# Patient Record
Sex: Female | Born: 1935
Health system: Southern US, Community
[De-identification: ages and names within clinical notes are randomized; demographics above are authoritative.]

## PROBLEM LIST (undated history)

## (undated) DIAGNOSIS — M419 Scoliosis, unspecified: Secondary | ICD-10-CM

## (undated) DIAGNOSIS — M479 Spondylosis, unspecified: Secondary | ICD-10-CM

## (undated) DIAGNOSIS — I10 Essential (primary) hypertension: Secondary | ICD-10-CM

## (undated) DIAGNOSIS — E785 Hyperlipidemia, unspecified: Secondary | ICD-10-CM

## (undated) DIAGNOSIS — Z886 Allergy status to analgesic agent status: Secondary | ICD-10-CM

## (undated) DIAGNOSIS — M1990 Unspecified osteoarthritis, unspecified site: Secondary | ICD-10-CM

## (undated) DIAGNOSIS — E079 Disorder of thyroid, unspecified: Secondary | ICD-10-CM

## (undated) DIAGNOSIS — J45909 Unspecified asthma, uncomplicated: Secondary | ICD-10-CM

## (undated) DIAGNOSIS — K219 Gastro-esophageal reflux disease without esophagitis: Secondary | ICD-10-CM

## (undated) HISTORY — DX: Allergy status to analgesic agent status: Z88.6

## (undated) HISTORY — DX: Unspecified osteoarthritis, unspecified site: M19.90

## (undated) HISTORY — DX: Gastro-esophageal reflux disease without esophagitis: K21.9

## (undated) HISTORY — DX: Hyperlipidemia, unspecified: E78.5

## (undated) HISTORY — DX: Essential (primary) hypertension: I10

## (undated) HISTORY — DX: Spondylosis, unspecified: M47.9

## (undated) HISTORY — PX: ABDOMINAL HYSTERECTOMY: SHX81

## (undated) HISTORY — DX: Disorder of thyroid, unspecified: E07.9

## (undated) HISTORY — DX: Scoliosis, unspecified: M41.9

## (undated) HISTORY — DX: Unspecified asthma, uncomplicated: J45.909

---

## 2004-08-05 HISTORY — PX: COLONOSCOPY: SHX174

## 2007-08-06 HISTORY — PX: BREAST BIOPSY: SHX20

## 2011-04-01 ENCOUNTER — Ambulatory Visit: Payer: Self-pay | Admitting: Internal Medicine

## 2011-08-06 HISTORY — PX: BLEPHAROPLASTY: SUR158

## 2011-08-27 DIAGNOSIS — K219 Gastro-esophageal reflux disease without esophagitis: Secondary | ICD-10-CM | POA: Diagnosis not present

## 2011-08-27 DIAGNOSIS — I10 Essential (primary) hypertension: Secondary | ICD-10-CM | POA: Diagnosis not present

## 2011-08-27 DIAGNOSIS — Z79899 Other long term (current) drug therapy: Secondary | ICD-10-CM | POA: Diagnosis not present

## 2011-08-27 DIAGNOSIS — E039 Hypothyroidism, unspecified: Secondary | ICD-10-CM | POA: Diagnosis not present

## 2011-08-27 DIAGNOSIS — Z23 Encounter for immunization: Secondary | ICD-10-CM | POA: Diagnosis not present

## 2011-08-27 DIAGNOSIS — G459 Transient cerebral ischemic attack, unspecified: Secondary | ICD-10-CM | POA: Diagnosis not present

## 2011-08-27 DIAGNOSIS — Z0000 Encounter for general adult medical examination without abnormal findings: Secondary | ICD-10-CM | POA: Diagnosis not present

## 2011-09-13 DIAGNOSIS — M5137 Other intervertebral disc degeneration, lumbosacral region: Secondary | ICD-10-CM | POA: Diagnosis not present

## 2011-10-02 DIAGNOSIS — J209 Acute bronchitis, unspecified: Secondary | ICD-10-CM | POA: Diagnosis not present

## 2011-10-02 DIAGNOSIS — J069 Acute upper respiratory infection, unspecified: Secondary | ICD-10-CM | POA: Diagnosis not present

## 2011-10-15 DIAGNOSIS — Z0000 Encounter for general adult medical examination without abnormal findings: Secondary | ICD-10-CM | POA: Diagnosis not present

## 2011-10-15 DIAGNOSIS — G459 Transient cerebral ischemic attack, unspecified: Secondary | ICD-10-CM | POA: Diagnosis not present

## 2011-10-15 DIAGNOSIS — Z79899 Other long term (current) drug therapy: Secondary | ICD-10-CM | POA: Diagnosis not present

## 2011-10-15 DIAGNOSIS — K219 Gastro-esophageal reflux disease without esophagitis: Secondary | ICD-10-CM | POA: Diagnosis not present

## 2011-10-15 DIAGNOSIS — I10 Essential (primary) hypertension: Secondary | ICD-10-CM | POA: Diagnosis not present

## 2011-10-15 DIAGNOSIS — E039 Hypothyroidism, unspecified: Secondary | ICD-10-CM | POA: Diagnosis not present

## 2011-11-11 DIAGNOSIS — H40019 Open angle with borderline findings, low risk, unspecified eye: Secondary | ICD-10-CM | POA: Diagnosis not present

## 2011-11-27 DIAGNOSIS — R928 Other abnormal and inconclusive findings on diagnostic imaging of breast: Secondary | ICD-10-CM | POA: Diagnosis not present

## 2012-01-22 DIAGNOSIS — H02839 Dermatochalasis of unspecified eye, unspecified eyelid: Secondary | ICD-10-CM | POA: Diagnosis not present

## 2012-01-22 DIAGNOSIS — H02409 Unspecified ptosis of unspecified eyelid: Secondary | ICD-10-CM | POA: Diagnosis not present

## 2012-02-03 DIAGNOSIS — R3129 Other microscopic hematuria: Secondary | ICD-10-CM | POA: Diagnosis not present

## 2012-02-03 DIAGNOSIS — N390 Urinary tract infection, site not specified: Secondary | ICD-10-CM | POA: Diagnosis not present

## 2012-02-10 DIAGNOSIS — H02409 Unspecified ptosis of unspecified eyelid: Secondary | ICD-10-CM | POA: Diagnosis not present

## 2012-02-11 DIAGNOSIS — H5340 Unspecified visual field defects: Secondary | ICD-10-CM | POA: Diagnosis not present

## 2012-02-11 DIAGNOSIS — H02109 Unspecified ectropion of unspecified eye, unspecified eyelid: Secondary | ICD-10-CM | POA: Diagnosis not present

## 2012-02-11 DIAGNOSIS — H02839 Dermatochalasis of unspecified eye, unspecified eyelid: Secondary | ICD-10-CM | POA: Diagnosis not present

## 2012-02-11 DIAGNOSIS — H02409 Unspecified ptosis of unspecified eyelid: Secondary | ICD-10-CM | POA: Diagnosis not present

## 2012-02-17 DIAGNOSIS — J40 Bronchitis, not specified as acute or chronic: Secondary | ICD-10-CM | POA: Diagnosis not present

## 2012-02-17 DIAGNOSIS — IMO0002 Reserved for concepts with insufficient information to code with codable children: Secondary | ICD-10-CM | POA: Diagnosis not present

## 2012-02-17 DIAGNOSIS — M1710 Unilateral primary osteoarthritis, unspecified knee: Secondary | ICD-10-CM | POA: Diagnosis not present

## 2012-02-17 DIAGNOSIS — R319 Hematuria, unspecified: Secondary | ICD-10-CM | POA: Diagnosis not present

## 2012-02-19 DIAGNOSIS — H02839 Dermatochalasis of unspecified eye, unspecified eyelid: Secondary | ICD-10-CM | POA: Diagnosis not present

## 2012-03-13 DIAGNOSIS — R5381 Other malaise: Secondary | ICD-10-CM | POA: Diagnosis not present

## 2012-03-13 DIAGNOSIS — R040 Epistaxis: Secondary | ICD-10-CM | POA: Diagnosis not present

## 2012-03-13 DIAGNOSIS — J45901 Unspecified asthma with (acute) exacerbation: Secondary | ICD-10-CM | POA: Diagnosis not present

## 2012-03-13 DIAGNOSIS — J40 Bronchitis, not specified as acute or chronic: Secondary | ICD-10-CM | POA: Diagnosis not present

## 2012-03-13 DIAGNOSIS — R5383 Other fatigue: Secondary | ICD-10-CM | POA: Diagnosis not present

## 2012-03-25 DIAGNOSIS — H02839 Dermatochalasis of unspecified eye, unspecified eyelid: Secondary | ICD-10-CM | POA: Diagnosis not present

## 2012-03-30 DIAGNOSIS — J189 Pneumonia, unspecified organism: Secondary | ICD-10-CM | POA: Diagnosis not present

## 2012-03-30 DIAGNOSIS — J40 Bronchitis, not specified as acute or chronic: Secondary | ICD-10-CM | POA: Diagnosis not present

## 2012-03-30 DIAGNOSIS — R911 Solitary pulmonary nodule: Secondary | ICD-10-CM | POA: Diagnosis not present

## 2012-05-01 DIAGNOSIS — Z23 Encounter for immunization: Secondary | ICD-10-CM | POA: Diagnosis not present

## 2012-06-05 DIAGNOSIS — R221 Localized swelling, mass and lump, neck: Secondary | ICD-10-CM | POA: Diagnosis not present

## 2012-06-05 DIAGNOSIS — R220 Localized swelling, mass and lump, head: Secondary | ICD-10-CM | POA: Diagnosis not present

## 2012-06-05 DIAGNOSIS — J45909 Unspecified asthma, uncomplicated: Secondary | ICD-10-CM | POA: Diagnosis not present

## 2012-06-16 DIAGNOSIS — R040 Epistaxis: Secondary | ICD-10-CM | POA: Diagnosis not present

## 2012-06-16 DIAGNOSIS — J342 Deviated nasal septum: Secondary | ICD-10-CM | POA: Diagnosis not present

## 2012-06-29 DIAGNOSIS — R911 Solitary pulmonary nodule: Secondary | ICD-10-CM | POA: Diagnosis not present

## 2012-06-29 DIAGNOSIS — Z87891 Personal history of nicotine dependence: Secondary | ICD-10-CM | POA: Diagnosis not present

## 2012-08-31 DIAGNOSIS — D235 Other benign neoplasm of skin of trunk: Secondary | ICD-10-CM | POA: Diagnosis not present

## 2012-08-31 DIAGNOSIS — L821 Other seborrheic keratosis: Secondary | ICD-10-CM | POA: Diagnosis not present

## 2012-08-31 DIAGNOSIS — L819 Disorder of pigmentation, unspecified: Secondary | ICD-10-CM | POA: Diagnosis not present

## 2012-08-31 DIAGNOSIS — L570 Actinic keratosis: Secondary | ICD-10-CM | POA: Diagnosis not present

## 2012-08-31 DIAGNOSIS — D1801 Hemangioma of skin and subcutaneous tissue: Secondary | ICD-10-CM | POA: Diagnosis not present

## 2012-09-04 DIAGNOSIS — G459 Transient cerebral ischemic attack, unspecified: Secondary | ICD-10-CM | POA: Diagnosis not present

## 2012-09-04 DIAGNOSIS — I10 Essential (primary) hypertension: Secondary | ICD-10-CM | POA: Diagnosis not present

## 2012-09-04 DIAGNOSIS — Z0000 Encounter for general adult medical examination without abnormal findings: Secondary | ICD-10-CM | POA: Diagnosis not present

## 2012-09-04 DIAGNOSIS — E039 Hypothyroidism, unspecified: Secondary | ICD-10-CM | POA: Diagnosis not present

## 2012-09-11 DIAGNOSIS — Z0000 Encounter for general adult medical examination without abnormal findings: Secondary | ICD-10-CM | POA: Diagnosis not present

## 2012-09-29 DIAGNOSIS — R918 Other nonspecific abnormal finding of lung field: Secondary | ICD-10-CM | POA: Diagnosis not present

## 2012-09-29 DIAGNOSIS — K7689 Other specified diseases of liver: Secondary | ICD-10-CM | POA: Diagnosis not present

## 2012-11-30 DIAGNOSIS — Z1231 Encounter for screening mammogram for malignant neoplasm of breast: Secondary | ICD-10-CM | POA: Diagnosis not present

## 2012-11-30 DIAGNOSIS — N281 Cyst of kidney, acquired: Secondary | ICD-10-CM | POA: Diagnosis not present

## 2012-11-30 DIAGNOSIS — E279 Disorder of adrenal gland, unspecified: Secondary | ICD-10-CM | POA: Diagnosis not present

## 2012-12-25 DIAGNOSIS — E039 Hypothyroidism, unspecified: Secondary | ICD-10-CM | POA: Diagnosis not present

## 2013-03-19 DIAGNOSIS — J45909 Unspecified asthma, uncomplicated: Secondary | ICD-10-CM | POA: Diagnosis not present

## 2013-03-19 DIAGNOSIS — R911 Solitary pulmonary nodule: Secondary | ICD-10-CM | POA: Diagnosis not present

## 2013-04-02 DIAGNOSIS — R918 Other nonspecific abnormal finding of lung field: Secondary | ICD-10-CM | POA: Diagnosis not present

## 2013-04-02 DIAGNOSIS — K7689 Other specified diseases of liver: Secondary | ICD-10-CM | POA: Diagnosis not present

## 2013-04-27 DIAGNOSIS — Z23 Encounter for immunization: Secondary | ICD-10-CM | POA: Diagnosis not present

## 2013-05-10 DIAGNOSIS — H4011X0 Primary open-angle glaucoma, stage unspecified: Secondary | ICD-10-CM | POA: Diagnosis not present

## 2013-05-10 DIAGNOSIS — H409 Unspecified glaucoma: Secondary | ICD-10-CM | POA: Diagnosis not present

## 2013-05-25 DIAGNOSIS — J45901 Unspecified asthma with (acute) exacerbation: Secondary | ICD-10-CM | POA: Diagnosis not present

## 2013-06-07 DIAGNOSIS — B370 Candidal stomatitis: Secondary | ICD-10-CM | POA: Diagnosis not present

## 2013-08-05 HISTORY — PX: ESOPHAGOGASTRODUODENOSCOPY: SHX1529

## 2013-09-06 DIAGNOSIS — I10 Essential (primary) hypertension: Secondary | ICD-10-CM | POA: Diagnosis not present

## 2013-09-06 DIAGNOSIS — E785 Hyperlipidemia, unspecified: Secondary | ICD-10-CM | POA: Diagnosis not present

## 2013-09-06 DIAGNOSIS — K219 Gastro-esophageal reflux disease without esophagitis: Secondary | ICD-10-CM | POA: Diagnosis not present

## 2013-09-06 DIAGNOSIS — E039 Hypothyroidism, unspecified: Secondary | ICD-10-CM | POA: Diagnosis not present

## 2013-09-06 DIAGNOSIS — Z79899 Other long term (current) drug therapy: Secondary | ICD-10-CM | POA: Diagnosis not present

## 2013-09-06 DIAGNOSIS — Z0000 Encounter for general adult medical examination without abnormal findings: Secondary | ICD-10-CM | POA: Diagnosis not present

## 2013-10-08 DIAGNOSIS — L821 Other seborrheic keratosis: Secondary | ICD-10-CM | POA: Diagnosis not present

## 2013-10-08 DIAGNOSIS — Q828 Other specified congenital malformations of skin: Secondary | ICD-10-CM | POA: Diagnosis not present

## 2013-10-08 DIAGNOSIS — D1801 Hemangioma of skin and subcutaneous tissue: Secondary | ICD-10-CM | POA: Diagnosis not present

## 2013-10-08 DIAGNOSIS — D235 Other benign neoplasm of skin of trunk: Secondary | ICD-10-CM | POA: Diagnosis not present

## 2013-10-08 DIAGNOSIS — L570 Actinic keratosis: Secondary | ICD-10-CM | POA: Diagnosis not present

## 2013-10-26 DIAGNOSIS — K2940 Chronic atrophic gastritis without bleeding: Secondary | ICD-10-CM | POA: Diagnosis not present

## 2013-10-26 DIAGNOSIS — Z79899 Other long term (current) drug therapy: Secondary | ICD-10-CM | POA: Diagnosis not present

## 2013-10-26 DIAGNOSIS — K222 Esophageal obstruction: Secondary | ICD-10-CM | POA: Diagnosis not present

## 2013-10-26 DIAGNOSIS — R1310 Dysphagia, unspecified: Secondary | ICD-10-CM | POA: Insufficient documentation

## 2013-10-26 DIAGNOSIS — E039 Hypothyroidism, unspecified: Secondary | ICD-10-CM | POA: Diagnosis not present

## 2013-10-26 DIAGNOSIS — J45909 Unspecified asthma, uncomplicated: Secondary | ICD-10-CM | POA: Diagnosis not present

## 2013-10-26 DIAGNOSIS — I10 Essential (primary) hypertension: Secondary | ICD-10-CM | POA: Diagnosis not present

## 2013-10-26 DIAGNOSIS — D131 Benign neoplasm of stomach: Secondary | ICD-10-CM | POA: Diagnosis not present

## 2013-10-26 DIAGNOSIS — K2100 Gastro-esophageal reflux disease with esophagitis, without bleeding: Secondary | ICD-10-CM | POA: Diagnosis not present

## 2013-10-26 DIAGNOSIS — K319 Disease of stomach and duodenum, unspecified: Secondary | ICD-10-CM | POA: Diagnosis not present

## 2013-10-26 DIAGNOSIS — K2970 Gastritis, unspecified, without bleeding: Secondary | ICD-10-CM | POA: Diagnosis not present

## 2013-10-26 DIAGNOSIS — IMO0002 Reserved for concepts with insufficient information to code with codable children: Secondary | ICD-10-CM | POA: Diagnosis not present

## 2013-12-01 DIAGNOSIS — Z1231 Encounter for screening mammogram for malignant neoplasm of breast: Secondary | ICD-10-CM | POA: Diagnosis not present

## 2013-12-20 DIAGNOSIS — R1110 Vomiting, unspecified: Secondary | ICD-10-CM | POA: Diagnosis not present

## 2013-12-20 DIAGNOSIS — K219 Gastro-esophageal reflux disease without esophagitis: Secondary | ICD-10-CM | POA: Diagnosis not present

## 2013-12-20 DIAGNOSIS — K222 Esophageal obstruction: Secondary | ICD-10-CM | POA: Diagnosis not present

## 2014-03-08 DIAGNOSIS — E039 Hypothyroidism, unspecified: Secondary | ICD-10-CM | POA: Diagnosis not present

## 2014-03-08 DIAGNOSIS — Z79899 Other long term (current) drug therapy: Secondary | ICD-10-CM | POA: Diagnosis not present

## 2014-03-08 DIAGNOSIS — K319 Disease of stomach and duodenum, unspecified: Secondary | ICD-10-CM | POA: Diagnosis not present

## 2014-03-08 DIAGNOSIS — I10 Essential (primary) hypertension: Secondary | ICD-10-CM | POA: Diagnosis not present

## 2014-03-08 DIAGNOSIS — K222 Esophageal obstruction: Secondary | ICD-10-CM | POA: Diagnosis not present

## 2014-03-08 DIAGNOSIS — R1310 Dysphagia, unspecified: Secondary | ICD-10-CM | POA: Diagnosis not present

## 2014-03-08 DIAGNOSIS — Z7902 Long term (current) use of antithrombotics/antiplatelets: Secondary | ICD-10-CM | POA: Diagnosis not present

## 2014-05-09 DIAGNOSIS — R918 Other nonspecific abnormal finding of lung field: Secondary | ICD-10-CM | POA: Diagnosis not present

## 2014-05-09 DIAGNOSIS — Z23 Encounter for immunization: Secondary | ICD-10-CM | POA: Diagnosis not present

## 2014-05-09 DIAGNOSIS — J4530 Mild persistent asthma, uncomplicated: Secondary | ICD-10-CM | POA: Diagnosis not present

## 2014-05-18 DIAGNOSIS — J398 Other specified diseases of upper respiratory tract: Secondary | ICD-10-CM | POA: Diagnosis not present

## 2014-05-18 DIAGNOSIS — R918 Other nonspecific abnormal finding of lung field: Secondary | ICD-10-CM | POA: Diagnosis not present

## 2014-05-18 DIAGNOSIS — K449 Diaphragmatic hernia without obstruction or gangrene: Secondary | ICD-10-CM | POA: Diagnosis not present

## 2014-06-15 DIAGNOSIS — K0381 Cracked tooth: Secondary | ICD-10-CM | POA: Diagnosis not present

## 2014-07-19 DIAGNOSIS — R1110 Vomiting, unspecified: Secondary | ICD-10-CM | POA: Diagnosis not present

## 2014-07-19 DIAGNOSIS — R109 Unspecified abdominal pain: Secondary | ICD-10-CM | POA: Diagnosis not present

## 2014-07-19 DIAGNOSIS — K222 Esophageal obstruction: Secondary | ICD-10-CM | POA: Diagnosis not present

## 2014-07-19 DIAGNOSIS — K219 Gastro-esophageal reflux disease without esophagitis: Secondary | ICD-10-CM | POA: Diagnosis not present

## 2014-08-08 DIAGNOSIS — H4011X2 Primary open-angle glaucoma, moderate stage: Secondary | ICD-10-CM | POA: Diagnosis not present

## 2014-09-13 DIAGNOSIS — N281 Cyst of kidney, acquired: Secondary | ICD-10-CM | POA: Diagnosis not present

## 2014-09-13 DIAGNOSIS — Z720 Tobacco use: Secondary | ICD-10-CM | POA: Diagnosis not present

## 2014-09-13 DIAGNOSIS — M4692 Unspecified inflammatory spondylopathy, cervical region: Secondary | ICD-10-CM | POA: Diagnosis not present

## 2014-09-13 DIAGNOSIS — I700 Atherosclerosis of aorta: Secondary | ICD-10-CM | POA: Diagnosis not present

## 2014-09-13 DIAGNOSIS — Z23 Encounter for immunization: Secondary | ICD-10-CM | POA: Diagnosis not present

## 2014-09-13 DIAGNOSIS — E039 Hypothyroidism, unspecified: Secondary | ICD-10-CM | POA: Diagnosis not present

## 2014-09-13 DIAGNOSIS — E782 Mixed hyperlipidemia: Secondary | ICD-10-CM | POA: Diagnosis not present

## 2014-09-13 DIAGNOSIS — G459 Transient cerebral ischemic attack, unspecified: Secondary | ICD-10-CM | POA: Diagnosis not present

## 2014-09-13 DIAGNOSIS — J4520 Mild intermittent asthma, uncomplicated: Secondary | ICD-10-CM | POA: Diagnosis not present

## 2014-09-13 DIAGNOSIS — K210 Gastro-esophageal reflux disease with esophagitis: Secondary | ICD-10-CM | POA: Diagnosis not present

## 2014-09-13 DIAGNOSIS — I10 Essential (primary) hypertension: Secondary | ICD-10-CM | POA: Diagnosis not present

## 2014-09-13 DIAGNOSIS — Z0000 Encounter for general adult medical examination without abnormal findings: Secondary | ICD-10-CM | POA: Diagnosis not present

## 2014-09-13 LAB — BASIC METABOLIC PANEL
BUN: 19 mg/dL (ref 4–21)
Creatinine: 1 mg/dL (ref 0.5–1.1)
Glucose: 95 mg/dL

## 2014-09-13 LAB — CBC AND DIFFERENTIAL
HEMOGLOBIN: 13.5 g/dL (ref 12.0–16.0)
WBC: 6.6 10^3/mL

## 2014-09-13 LAB — TSH: TSH: 0.5 u[IU]/mL (ref 0.41–5.90)

## 2014-09-13 LAB — LIPID PANEL
Cholesterol: 237 mg/dL — AB (ref 0–200)
HDL: 74 mg/dL — AB (ref 35–70)
LDL Cholesterol: 140 mg/dL
Triglycerides: 113 mg/dL (ref 40–160)

## 2014-12-05 DIAGNOSIS — Z1231 Encounter for screening mammogram for malignant neoplasm of breast: Secondary | ICD-10-CM | POA: Diagnosis not present

## 2014-12-14 DIAGNOSIS — N63 Unspecified lump in breast: Secondary | ICD-10-CM | POA: Diagnosis not present

## 2015-01-18 ENCOUNTER — Encounter: Payer: Self-pay | Admitting: Internal Medicine

## 2015-01-18 ENCOUNTER — Other Ambulatory Visit: Payer: Self-pay | Admitting: Internal Medicine

## 2015-01-18 DIAGNOSIS — R918 Other nonspecific abnormal finding of lung field: Secondary | ICD-10-CM | POA: Insufficient documentation

## 2015-01-18 DIAGNOSIS — E785 Hyperlipidemia, unspecified: Secondary | ICD-10-CM | POA: Insufficient documentation

## 2015-01-18 DIAGNOSIS — L309 Dermatitis, unspecified: Secondary | ICD-10-CM | POA: Insufficient documentation

## 2015-01-18 DIAGNOSIS — G43009 Migraine without aura, not intractable, without status migrainosus: Secondary | ICD-10-CM | POA: Insufficient documentation

## 2015-01-18 DIAGNOSIS — G939 Disorder of brain, unspecified: Secondary | ICD-10-CM | POA: Insufficient documentation

## 2015-01-18 DIAGNOSIS — E039 Hypothyroidism, unspecified: Secondary | ICD-10-CM | POA: Insufficient documentation

## 2015-01-18 DIAGNOSIS — F17201 Nicotine dependence, unspecified, in remission: Secondary | ICD-10-CM | POA: Insufficient documentation

## 2015-01-18 DIAGNOSIS — J4520 Mild intermittent asthma, uncomplicated: Secondary | ICD-10-CM | POA: Insufficient documentation

## 2015-01-18 DIAGNOSIS — I700 Atherosclerosis of aorta: Secondary | ICD-10-CM | POA: Insufficient documentation

## 2015-01-18 DIAGNOSIS — K2100 Gastro-esophageal reflux disease with esophagitis, without bleeding: Secondary | ICD-10-CM | POA: Insufficient documentation

## 2015-01-18 DIAGNOSIS — I6782 Cerebral ischemia: Secondary | ICD-10-CM | POA: Insufficient documentation

## 2015-01-18 DIAGNOSIS — Z860101 Personal history of adenomatous and serrated colon polyps: Secondary | ICD-10-CM | POA: Insufficient documentation

## 2015-01-18 DIAGNOSIS — L570 Actinic keratosis: Secondary | ICD-10-CM | POA: Diagnosis not present

## 2015-01-18 DIAGNOSIS — N281 Cyst of kidney, acquired: Secondary | ICD-10-CM | POA: Insufficient documentation

## 2015-01-18 DIAGNOSIS — I10 Essential (primary) hypertension: Secondary | ICD-10-CM | POA: Insufficient documentation

## 2015-01-18 DIAGNOSIS — L821 Other seborrheic keratosis: Secondary | ICD-10-CM | POA: Diagnosis not present

## 2015-01-18 DIAGNOSIS — M47812 Spondylosis without myelopathy or radiculopathy, cervical region: Secondary | ICD-10-CM | POA: Insufficient documentation

## 2015-01-18 DIAGNOSIS — Z86010 Personal history of colonic polyps: Secondary | ICD-10-CM | POA: Insufficient documentation

## 2015-01-18 DIAGNOSIS — M1990 Unspecified osteoarthritis, unspecified site: Secondary | ICD-10-CM | POA: Insufficient documentation

## 2015-01-20 ENCOUNTER — Ambulatory Visit: Payer: Self-pay | Admitting: Internal Medicine

## 2015-01-24 DIAGNOSIS — M5030 Other cervical disc degeneration, unspecified cervical region: Secondary | ICD-10-CM | POA: Diagnosis not present

## 2015-01-24 DIAGNOSIS — G5602 Carpal tunnel syndrome, left upper limb: Secondary | ICD-10-CM | POA: Diagnosis not present

## 2015-02-07 DIAGNOSIS — M5030 Other cervical disc degeneration, unspecified cervical region: Secondary | ICD-10-CM | POA: Diagnosis not present

## 2015-02-07 DIAGNOSIS — G5602 Carpal tunnel syndrome, left upper limb: Secondary | ICD-10-CM | POA: Diagnosis not present

## 2015-02-16 DIAGNOSIS — G5602 Carpal tunnel syndrome, left upper limb: Secondary | ICD-10-CM | POA: Diagnosis not present

## 2015-02-16 DIAGNOSIS — M5030 Other cervical disc degeneration, unspecified cervical region: Secondary | ICD-10-CM | POA: Diagnosis not present

## 2015-02-23 DIAGNOSIS — G5602 Carpal tunnel syndrome, left upper limb: Secondary | ICD-10-CM | POA: Diagnosis not present

## 2015-02-28 ENCOUNTER — Other Ambulatory Visit: Payer: Self-pay | Admitting: Internal Medicine

## 2015-02-28 DIAGNOSIS — K0263 Dental caries on smooth surface penetrating into pulp: Secondary | ICD-10-CM | POA: Diagnosis not present

## 2015-05-10 DIAGNOSIS — R918 Other nonspecific abnormal finding of lung field: Secondary | ICD-10-CM | POA: Diagnosis not present

## 2015-05-29 DIAGNOSIS — Z23 Encounter for immunization: Secondary | ICD-10-CM | POA: Diagnosis not present

## 2015-06-13 DIAGNOSIS — R911 Solitary pulmonary nodule: Secondary | ICD-10-CM | POA: Diagnosis not present

## 2015-06-20 DIAGNOSIS — R921 Mammographic calcification found on diagnostic imaging of breast: Secondary | ICD-10-CM | POA: Diagnosis not present

## 2015-06-20 DIAGNOSIS — N63 Unspecified lump in breast: Secondary | ICD-10-CM | POA: Diagnosis not present

## 2015-06-20 DIAGNOSIS — N6001 Solitary cyst of right breast: Secondary | ICD-10-CM | POA: Diagnosis not present

## 2015-06-30 DIAGNOSIS — J4521 Mild intermittent asthma with (acute) exacerbation: Secondary | ICD-10-CM | POA: Diagnosis not present

## 2015-06-30 DIAGNOSIS — J189 Pneumonia, unspecified organism: Secondary | ICD-10-CM | POA: Diagnosis not present

## 2015-06-30 DIAGNOSIS — J069 Acute upper respiratory infection, unspecified: Secondary | ICD-10-CM | POA: Diagnosis not present

## 2015-07-12 DIAGNOSIS — J42 Unspecified chronic bronchitis: Secondary | ICD-10-CM | POA: Diagnosis not present

## 2015-07-13 DIAGNOSIS — J189 Pneumonia, unspecified organism: Secondary | ICD-10-CM | POA: Diagnosis not present

## 2015-07-13 DIAGNOSIS — J42 Unspecified chronic bronchitis: Secondary | ICD-10-CM | POA: Diagnosis not present

## 2015-07-13 DIAGNOSIS — R911 Solitary pulmonary nodule: Secondary | ICD-10-CM | POA: Diagnosis not present

## 2015-08-12 ENCOUNTER — Other Ambulatory Visit: Payer: Self-pay | Admitting: Internal Medicine

## 2015-08-16 NOTE — Telephone Encounter (Signed)
pts coming in on 09/12/15 for a f/u and 11/20/15 for her cpe

## 2015-09-09 ENCOUNTER — Other Ambulatory Visit: Payer: Self-pay | Admitting: Internal Medicine

## 2015-09-12 ENCOUNTER — Encounter: Payer: Self-pay | Admitting: Internal Medicine

## 2015-09-12 ENCOUNTER — Ambulatory Visit (INDEPENDENT_AMBULATORY_CARE_PROVIDER_SITE_OTHER): Payer: Medicare Other | Admitting: Internal Medicine

## 2015-09-12 VITALS — BP 146/82 | HR 83 | Temp 98.1°F | Ht 63.0 in | Wt 162.4 lb

## 2015-09-12 DIAGNOSIS — E034 Atrophy of thyroid (acquired): Secondary | ICD-10-CM

## 2015-09-12 DIAGNOSIS — I10 Essential (primary) hypertension: Secondary | ICD-10-CM

## 2015-09-12 DIAGNOSIS — J4520 Mild intermittent asthma, uncomplicated: Secondary | ICD-10-CM | POA: Diagnosis not present

## 2015-09-12 DIAGNOSIS — E785 Hyperlipidemia, unspecified: Secondary | ICD-10-CM

## 2015-09-12 DIAGNOSIS — R6889 Other general symptoms and signs: Secondary | ICD-10-CM | POA: Diagnosis not present

## 2015-09-12 DIAGNOSIS — E038 Other specified hypothyroidism: Secondary | ICD-10-CM | POA: Diagnosis not present

## 2015-09-12 DIAGNOSIS — G43009 Migraine without aura, not intractable, without status migrainosus: Secondary | ICD-10-CM

## 2015-09-12 LAB — POCT INFLUENZA A/B
INFLUENZA A, POC: NEGATIVE
INFLUENZA B, POC: POSITIVE — AB

## 2015-09-12 MED ORDER — LEVOTHYROXINE SODIUM 125 MCG PO TABS: 125.0000 ug | ORAL_TABLET | Freq: Every day | ORAL | Status: DC

## 2015-09-12 MED ORDER — DIAZEPAM 10 MG PO TABS: ORAL_TABLET | ORAL | Status: DC

## 2015-09-12 MED ORDER — CLOPIDOGREL BISULFATE 75 MG PO TABS: 75.0000 mg | ORAL_TABLET | Freq: Every day | ORAL | Status: DC

## 2015-09-12 MED ORDER — ALBUTEROL SULFATE HFA 108 (90 BASE) MCG/ACT IN AERS: 2.0000 | INHALATION_SPRAY | Freq: Four times a day (QID) | RESPIRATORY_TRACT | Status: DC | PRN

## 2015-09-12 MED ORDER — FLUTICASONE-SALMETEROL 250-50 MCG/DOSE IN AEPB: 1.0000 | INHALATION_SPRAY | Freq: Two times a day (BID) | RESPIRATORY_TRACT | Status: DC

## 2015-09-12 MED ORDER — MELOXICAM 15 MG PO TABS: 15.0000 mg | ORAL_TABLET | Freq: Every day | ORAL | Status: DC

## 2015-09-12 MED ORDER — OSELTAMIVIR PHOSPHATE 75 MG PO CAPS: 75.0000 mg | ORAL_CAPSULE | Freq: Two times a day (BID) | ORAL | Status: DC

## 2015-09-12 MED ORDER — IRBESARTAN 300 MG PO TABS: 300.0000 mg | ORAL_TABLET | Freq: Every day | ORAL | Status: DC

## 2015-09-12 MED ORDER — RANITIDINE HCL 150 MG PO TABS: 300.0000 mg | ORAL_TABLET | Freq: Two times a day (BID) | ORAL | Status: DC

## 2015-09-12 MED ORDER — HYDROCODONE-HOMATROPINE 5-1.5 MG/5ML PO SYRP: 5.0000 mL | ORAL_SOLUTION | Freq: Four times a day (QID) | ORAL | Status: DC | PRN

## 2015-09-12 NOTE — Progress Notes (Signed)
Date:  09/12/2015   Name:  Crystal Zavala   DOB:  12/10/1931   MRN:  [id number]   Chief Complaint: Follow-up; Hypothyroidism; Hypertension; and Cough Hypertension This is a chronic problem. The current episode started more than 1 year ago. The problem is unchanged. The problem is controlled. Associated symptoms include chest pain, headaches and shortness of breath. Pertinent negatives include no palpitations. Hypertensive end-organ damage includes a thyroid problem.  Cough This is a new problem. The current episode started in the past 7 days. The problem has been unchanged. The problem occurs every few minutes. The cough is non-productive. Associated symptoms include chest pain, chills, a fever, headaches, postnasal drip, shortness of breath and wheezing. Pertinent negatives include no eye redness or rash. She has tried OTC cough suppressant for the symptoms. The treatment provided mild relief.  Thyroid Problem Patient reports no cold intolerance, constipation, depressed mood, diarrhea or palpitations. The symptoms have been stable. Past treatments include levothyroxine. The treatment provided significant relief.  Gastroesophageal Reflux She complains of chest pain, coughing and wheezing. She reports no abdominal pain, no choking or no nausea. The problem occurs rarely. The problem has been waxing and waning. She has tried a histamine-2 antagonist for the symptoms. The treatment provided significant relief.    Review of Systems  Constitutional: Positive for fever and chills.  HENT: Positive for postnasal drip, sinus pressure and sneezing. Negative for tinnitus and trouble swallowing.   Eyes: Negative for redness and visual disturbance.  Respiratory: Positive for cough, shortness of breath and wheezing. Negative for choking and stridor.   Cardiovascular: Positive for chest pain. Negative for palpitations and leg swelling.  Gastrointestinal: Negative for nausea, abdominal pain, diarrhea and  constipation.  Endocrine: Negative for cold intolerance.  Genitourinary: Negative for dysuria and hematuria.  Musculoskeletal: Negative for arthralgias and gait problem.  Skin: Negative for rash.  Neurological: Positive for headaches.  Psychiatric/Behavioral: Negative for sleep disturbance and dysphoric mood.    Patient Active Problem List   Diagnosis Date Noted  . Aortic atherosclerosis ([place]) 01/18/2015  . Arthritis of neck ([place]) 01/18/2015  . Dermatitis, eczematoid 01/18/2015  . Essential (primary) hypertension 01/18/2015  . Esophagitis, reflux 01/18/2015  . H/O adenomatous polyp of colon 01/18/2015  . Adult hypothyroidism 01/18/2015  . Migraine without aura and responsive to treatment 01/18/2015  . Asthma, mild intermittent 01/18/2015  . Hyperlipidemia, mild 01/18/2015  . Arthritis of knee, degenerative 01/18/2015  . Lung nodule, multiple 01/18/2015  . Kidney cysts 01/18/2015  . Current tobacco use 01/18/2015  . Temporary cerebral vascular dysfunction 01/18/2015    Prior to Admission medications   Medication Sig Start Date End Date Taking? Authorizing Provider  albuterol (PROAIR HFA) 108 (90 BASE) MCG/ACT inhaler Inhale 2 puffs into the lungs 4 (four) times daily as needed.   Yes Historical Provider, MD  clopidogrel (PLAVIX) 75 MG tablet Take 1 tablet by mouth daily. 09/13/14  Yes Historical Provider, MD  diazepam (VALIUM) 10 MG tablet TAKE 1 TABLET BY MOUTH NIGHTLY AS NEEDED 02/28/15  Yes Glean Hess, MD  Fluticasone-Salmeterol (ADVAIR DISKUS) 250-50 MCG/DOSE AEPB Inhale 1 puff into the lungs 2 (two) times daily.   Yes Historical Provider, MD  irbesartan (AVAPRO) 300 MG tablet Take 1 tablet by mouth daily. 09/13/14  Yes Historical Provider, MD  levothyroxine (SYNTHROID, LEVOTHROID) 125 MCG tablet TAKE 1 TABLET BY MOUTH DAILY 09/09/15  Yes Glean Hess, MD  meloxicam (MOBIC) 15 MG tablet TAKE 1 TABLET BY MOUTH EVERY DAY  08/12/15  Yes Glean Hess, MD  ranitidine (ZANTAC)  150 MG tablet Take 2 tablets by mouth 2 (two) times daily.   Yes Historical Provider, MD    Allergies  Allergen Reactions  . Ace Inhibitors Cough    Past Surgical History  Procedure Laterality Date  . Abdominal hysterectomy    . Blepharoplasty Bilateral 2013  . Breast biopsy Left 2009    benign  . Esophagogastroduodenoscopy  2015    Dilation of esophageal stricture  . Colonoscopy  2006    Normal    Social History  Substance Use Topics  . Smoking status: Former Research scientist (life sciences)  . Smokeless tobacco: None  . Alcohol Use: No     Medication list has been reviewed and updated.   Physical Exam  Constitutional: She is oriented to person, place, and time. She appears well-developed and well-nourished. She has a sickly appearance.  HENT:  Right Ear: External ear and ear canal normal. Tympanic membrane is retracted. Tympanic membrane is not erythematous.  Left Ear: External ear and ear canal normal. Tympanic membrane is retracted. Tympanic membrane is not erythematous.  Nose: Right sinus exhibits maxillary sinus tenderness and frontal sinus tenderness. Left sinus exhibits maxillary sinus tenderness and frontal sinus tenderness.  Mouth/Throat: Uvula is midline and mucous membranes are normal. No oral lesions. No oropharyngeal exudate or posterior oropharyngeal erythema.  Neck: Normal range of motion. Neck supple. No thyromegaly present.  Cardiovascular: Normal rate, regular rhythm and normal heart sounds.   Pulmonary/Chest: Effort normal and breath sounds normal. No accessory muscle usage. No respiratory distress. She has no wheezes. She has no rales.  Lymphadenopathy:    She has no cervical adenopathy.  Neurological: She is alert and oriented to person, place, and time.  Psychiatric: She has a normal mood and affect.    BP 146/82 mmHg  Pulse 83  Temp(Src) 98.1 F (36.7 C) (Oral)  Ht 5\' 3"  (1.6 m)  Wt 162 lb 6.4 oz (73.664 kg)  BMI 28.78 kg/m2  SpO2 97%  Assessment and Plan: 1.  Flu-like symptoms Supportive care for influenza with rest and fluids - POCT Influenza A/B (positive for B) - HYDROcodone-homatropine (HYCODAN) 5-1.5 MG/5ML syrup; Take 5 mLs by mouth every 6 (six) hours as needed for cough.  Dispense: 120 mL; Refill: 0 - oseltamivir (TAMIFLU) 75 MG capsule; Take 1 capsule (75 mg total) by mouth 2 (two) times daily.  Dispense: 10 capsule; Refill: 0  2. Migraine without aura and responsive to treatment Minimal symptoms  3. Essential (primary) hypertension controlled - irbesartan (AVAPRO) 300 MG tablet; Take 1 tablet (300 mg total) by mouth daily.  Dispense: 90 tablet; Refill: 3 - CBC with Differential/Platelet - Comprehensive metabolic panel  4. Asthma, mild intermittent, uncomplicated Doing well on maintenance therapy - albuterol (PROAIR HFA) 108 (90 Base) MCG/ACT inhaler; Inhale 2 puffs into the lungs 4 (four) times daily as needed.  Dispense: 54 g; Refill: 3 - Fluticasone-Salmeterol (ADVAIR DISKUS) 250-50 MCG/DOSE AEPB; Inhale 1 puff into the lungs 2 (two) times daily.  Dispense: 180 each; Refill: 3  5. Hypothyroidism due to acquired atrophy of thyroid supplemented - levothyroxine (SYNTHROID, LEVOTHROID) 125 MCG tablet; Take 1 tablet (125 mcg total) by mouth daily.  Dispense: 90 tablet; Refill: 3 - TSH  6. Hyperlipidemia, mild Continue low fat diet - Lipid panel   Halina Maidens, MD [facility]  09/12/2015

## 2015-09-12 NOTE — Patient Instructions (Signed)

## 2015-09-13 ENCOUNTER — Telehealth: Payer: Self-pay

## 2015-09-13 LAB — CBC WITH DIFFERENTIAL/PLATELET
BASOS: 0 %
Basophils Absolute: 0 10*3/uL (ref 0.0–0.2)
EOS (ABSOLUTE): 0.2 10*3/uL (ref 0.0–0.4)
EOS: 4 %
HEMATOCRIT: 38.8 % (ref 34.0–46.6)
HEMOGLOBIN: 13 g/dL (ref 11.1–15.9)
Immature Grans (Abs): 0 10*3/uL (ref 0.0–0.1)
Immature Granulocytes: 0 %
LYMPHS ABS: 1 10*3/uL (ref 0.7–3.1)
Lymphs: 20 %
MCH: 30.5 pg (ref 26.6–33.0)
MCHC: 33.5 g/dL (ref 31.5–35.7)
MCV: 91 fL (ref 79–97)
MONOCYTES: 17 %
MONOS ABS: 0.8 10*3/uL (ref 0.1–0.9)
NEUTROS ABS: 2.8 10*3/uL (ref 1.4–7.0)
Neutrophils: 59 %
Platelets: 230 10*3/uL (ref 150–379)
RBC: 4.26 x10E6/uL (ref 3.77–5.28)
RDW: 14 % (ref 12.3–15.4)
WBC: 4.9 10*3/uL (ref 3.4–10.8)

## 2015-09-13 LAB — COMPREHENSIVE METABOLIC PANEL
A/G RATIO: 1.7 (ref 1.1–2.5)
ALBUMIN: 4 g/dL (ref 3.5–4.7)
ALK PHOS: 116 IU/L (ref 39–117)
ALT: 15 IU/L (ref 0–32)
AST: 20 IU/L (ref 0–40)
BILIRUBIN TOTAL: 0.5 mg/dL (ref 0.0–1.2)
BUN / CREAT RATIO: 15 (ref 11–26)
BUN: 13 mg/dL (ref 8–27)
CO2: 24 mmol/L (ref 18–29)
CREATININE: 0.87 mg/dL (ref 0.57–1.00)
Calcium: 9.6 mg/dL (ref 8.7–10.3)
Chloride: 101 mmol/L (ref 96–106)
GFR calc Af Amer: 72 mL/min/{1.73_m2} (ref >59)
GFR calc non Af Amer: 62 mL/min/{1.73_m2} (ref >59)
GLOBULIN, TOTAL: 2.4 g/dL (ref 1.5–4.5)
Glucose: 80 mg/dL (ref 65–99)
POTASSIUM: 4.8 mmol/L (ref 3.5–5.2)
SODIUM: 139 mmol/L (ref 134–144)
Total Protein: 6.4 g/dL (ref 6.0–8.5)

## 2015-09-13 LAB — LIPID PANEL
CHOL/HDL RATIO: 3.1 ratio (ref 0.0–4.4)
CHOLESTEROL TOTAL: 196 mg/dL (ref 100–199)
HDL: 64 mg/dL (ref >39)
LDL CALC: 114 mg/dL — AB (ref 0–99)
TRIGLYCERIDES: 90 mg/dL (ref 0–149)
VLDL Cholesterol Cal: 18 mg/dL (ref 5–40)

## 2015-09-13 LAB — TSH: TSH: 0.044 u[IU]/mL — ABNORMAL LOW (ref 0.450–4.500)

## 2015-09-13 NOTE — Telephone Encounter (Signed)
-----   Message from Glean Hess, MD sent at 09/13/2015  8:11 AM EST ----- Labs are normal.  Thyroid dose may be slightly high but will not change it at this time.

## 2015-09-13 NOTE — Telephone Encounter (Signed)
Spoke with patient. Patient advised of all results and verbalized understanding. Will call back with any future questions or concerns. MAH  

## 2015-10-24 ENCOUNTER — Other Ambulatory Visit: Payer: Self-pay | Admitting: Internal Medicine

## 2015-11-20 ENCOUNTER — Encounter: Payer: Self-pay | Admitting: Internal Medicine

## 2016-01-05 DIAGNOSIS — N63 Unspecified lump in breast: Secondary | ICD-10-CM | POA: Diagnosis not present

## 2016-01-05 DIAGNOSIS — R921 Mammographic calcification found on diagnostic imaging of breast: Secondary | ICD-10-CM | POA: Diagnosis not present

## 2016-01-05 DIAGNOSIS — R922 Inconclusive mammogram: Secondary | ICD-10-CM | POA: Diagnosis not present

## 2016-01-08 DIAGNOSIS — L814 Other melanin hyperpigmentation: Secondary | ICD-10-CM | POA: Diagnosis not present

## 2016-01-08 DIAGNOSIS — Z1283 Encounter for screening for malignant neoplasm of skin: Secondary | ICD-10-CM | POA: Diagnosis not present

## 2016-01-08 DIAGNOSIS — L821 Other seborrheic keratosis: Secondary | ICD-10-CM | POA: Diagnosis not present

## 2016-01-08 DIAGNOSIS — L812 Freckles: Secondary | ICD-10-CM | POA: Diagnosis not present

## 2016-01-08 DIAGNOSIS — D485 Neoplasm of uncertain behavior of skin: Secondary | ICD-10-CM | POA: Diagnosis not present

## 2016-01-08 DIAGNOSIS — D225 Melanocytic nevi of trunk: Secondary | ICD-10-CM | POA: Diagnosis not present

## 2016-01-08 DIAGNOSIS — L720 Epidermal cyst: Secondary | ICD-10-CM | POA: Diagnosis not present

## 2016-01-08 DIAGNOSIS — D1801 Hemangioma of skin and subcutaneous tissue: Secondary | ICD-10-CM | POA: Diagnosis not present

## 2016-01-10 ENCOUNTER — Encounter: Payer: Self-pay | Admitting: Internal Medicine

## 2016-01-10 DIAGNOSIS — N6310 Unspecified lump in the right breast, unspecified quadrant: Secondary | ICD-10-CM

## 2016-01-10 DIAGNOSIS — K0263 Dental caries on smooth surface penetrating into pulp: Secondary | ICD-10-CM | POA: Diagnosis not present

## 2016-01-10 HISTORY — DX: Unspecified lump in the right breast, unspecified quadrant: N63.10

## 2016-04-01 DIAGNOSIS — M5136 Other intervertebral disc degeneration, lumbar region: Secondary | ICD-10-CM | POA: Diagnosis not present

## 2016-04-05 ENCOUNTER — Other Ambulatory Visit: Payer: Self-pay | Admitting: Internal Medicine

## 2016-04-21 ENCOUNTER — Encounter: Payer: Self-pay | Admitting: Internal Medicine

## 2016-04-22 ENCOUNTER — Ambulatory Visit (INDEPENDENT_AMBULATORY_CARE_PROVIDER_SITE_OTHER): Payer: Medicare Other | Admitting: Internal Medicine

## 2016-04-22 ENCOUNTER — Encounter: Payer: Self-pay | Admitting: Internal Medicine

## 2016-04-22 VITALS — BP 138/88 | HR 77 | Resp 16 | Ht 63.0 in | Wt 161.0 lb

## 2016-04-22 DIAGNOSIS — N3281 Overactive bladder: Secondary | ICD-10-CM

## 2016-04-22 DIAGNOSIS — E034 Atrophy of thyroid (acquired): Secondary | ICD-10-CM | POA: Insufficient documentation

## 2016-04-22 DIAGNOSIS — Z23 Encounter for immunization: Secondary | ICD-10-CM

## 2016-04-22 DIAGNOSIS — E038 Other specified hypothyroidism: Secondary | ICD-10-CM | POA: Diagnosis not present

## 2016-04-22 DIAGNOSIS — F5101 Primary insomnia: Secondary | ICD-10-CM | POA: Insufficient documentation

## 2016-04-22 DIAGNOSIS — G4700 Insomnia, unspecified: Secondary | ICD-10-CM | POA: Insufficient documentation

## 2016-04-22 DIAGNOSIS — I10 Essential (primary) hypertension: Secondary | ICD-10-CM

## 2016-04-22 DIAGNOSIS — M170 Bilateral primary osteoarthritis of knee: Secondary | ICD-10-CM

## 2016-04-22 DIAGNOSIS — M5136 Other intervertebral disc degeneration, lumbar region: Secondary | ICD-10-CM

## 2016-04-22 MED ORDER — FESOTERODINE FUMARATE ER 4 MG PO TB24: 4.0000 mg | ORAL_TABLET | Freq: Every day | ORAL | 1 refills | Status: DC

## 2016-04-22 NOTE — Progress Notes (Signed)
Date:  04/22/2016   Name:  Crystal Zavala   DOB:  10-09-1931   MRN:  [id number]   Chief Complaint: Hypertension; Insomnia; and Hypothyroidism Hypertension  This is a chronic problem. The current episode started today. The problem is unchanged. The problem is controlled. Pertinent negatives include no chest pain, headaches, palpitations or shortness of breath. Hypertensive end-organ damage includes a thyroid problem.  Insomnia  Primary symptoms: difficulty falling asleep, frequent awakening.  The problem is unchanged. Past treatments include medication. The treatment provided significant relief.  Thyroid Problem  Presents for follow-up visit. Patient reports no fatigue, palpitations or tremors. The symptoms have been stable (last TSH slightly low).  Overactive bladder - wears a pad every day.  She has urgency and leakage if she can get to the restroom right away.  She has some stress incontinence as well.  She has never taken medication.  Review of Systems  Constitutional: Negative for appetite change, fatigue, fever and unexpected weight change.  HENT: Negative for tinnitus and trouble swallowing.   Eyes: Negative for visual disturbance.  Respiratory: Negative for cough, chest tightness and shortness of breath.   Cardiovascular: Negative for chest pain, palpitations and leg swelling.  Gastrointestinal: Negative for abdominal pain.  Endocrine: Negative for polydipsia and polyuria.  Genitourinary: Positive for frequency and urgency. Negative for dysuria and hematuria.  Musculoskeletal: Positive for back pain. Negative for arthralgias, joint swelling and myalgias.  Neurological: Negative for tremors, numbness and headaches.  Psychiatric/Behavioral: Negative for dysphoric mood. The patient has insomnia.     Patient Active Problem List   Diagnosis Date Noted  . Hypothyroidism due to acquired atrophy of thyroid 04/22/2016  . Insomnia 04/22/2016  . Breast mass, right 01/10/2016  .  Aortic atherosclerosis ([place]) 01/18/2015  . Arthritis of neck ([place]) 01/18/2015  . Dermatitis, eczematoid 01/18/2015  . Essential (primary) hypertension 01/18/2015  . Esophagitis, reflux 01/18/2015  . H/O adenomatous polyp of colon 01/18/2015  . Adult hypothyroidism 01/18/2015  . Migraine without aura and responsive to treatment 01/18/2015  . Asthma, mild intermittent 01/18/2015  . Hyperlipidemia, mild 01/18/2015  . Arthritis, senescent 01/18/2015  . Lung nodule, multiple 01/18/2015  . Kidney cysts 01/18/2015  . Tobacco use disorder, moderate, in sustained remission 01/18/2015  . Temporary cerebral vascular dysfunction 01/18/2015    Prior to Admission medications   Medication Sig Start Date End Date Taking? Authorizing Provider  albuterol (PROAIR HFA) 108 (90 Base) MCG/ACT inhaler Inhale 2 puffs into the lungs 4 (four) times daily as needed. 09/12/15  Yes Glean Hess, MD  clopidogrel (PLAVIX) 75 MG tablet TAKE 1 TABLET BY MOUTH EVERY DAY 10/24/15  Yes Glean Hess, MD  diazepam (VALIUM) 10 MG tablet TAKE 1 TABLET BY MOUTH NIGHTLY AS NEEDED 04/05/16  Yes Glean Hess, MD  Fluticasone-Salmeterol (ADVAIR DISKUS) 250-50 MCG/DOSE AEPB Inhale 1 puff into the lungs 2 (two) times daily. 09/12/15  Yes Glean Hess, MD  irbesartan (AVAPRO) 300 MG tablet TAKE 1 TABLET BY MOUTH EVERY DAY 10/24/15  Yes Glean Hess, MD  levothyroxine (SYNTHROID, LEVOTHROID) 125 MCG tablet Take 1 tablet (125 mcg total) by mouth daily. 09/12/15  Yes Glean Hess, MD  meloxicam (MOBIC) 15 MG tablet Take 1 tablet (15 mg total) by mouth daily. 09/12/15  Yes Glean Hess, MD  ranitidine (ZANTAC) 150 MG tablet Take 2 tablets (300 mg total) by mouth 2 (two) times daily. 09/12/15  Yes Glean Hess, MD    Allergies  Allergen Reactions  . Ace Inhibitors Cough    Past Surgical History:  Procedure Laterality Date  . ABDOMINAL HYSTERECTOMY    . BLEPHAROPLASTY Bilateral 2013  . BREAST BIOPSY Left 2009    benign  . COLONOSCOPY  2006   Normal  . ESOPHAGOGASTRODUODENOSCOPY  2015   Dilation of esophageal stricture    Social History  Substance Use Topics  . Smoking status: Former Research scientist (life sciences)  . Smokeless tobacco: Never Used  . Alcohol use No     Medication list has been reviewed and updated.   Physical Exam  Constitutional: She is oriented to person, place, and time. She appears well-developed and well-nourished. No distress.  HENT:  Head: Normocephalic and atraumatic.  Right Ear: Tympanic membrane and ear canal normal.  Left Ear: Tympanic membrane and ear canal normal.  Nose: Right sinus exhibits no maxillary sinus tenderness. Left sinus exhibits no maxillary sinus tenderness.  Mouth/Throat: Uvula is midline and oropharynx is clear and moist.  Eyes: Conjunctivae and EOM are normal. Right eye exhibits no discharge. Left eye exhibits no discharge. No scleral icterus.  Neck: Normal range of motion. Carotid bruit is not present. No erythema present. No thyromegaly present.  Cardiovascular: Normal rate, regular rhythm, normal heart sounds and normal pulses.   Pulmonary/Chest: Effort normal. No respiratory distress. She has no wheezes.  Abdominal: Soft. Bowel sounds are normal. There is no hepatosplenomegaly. There is no tenderness. There is no CVA tenderness.  Musculoskeletal: Normal range of motion.  Lymphadenopathy:    She has no cervical adenopathy.    She has no axillary adenopathy.  Neurological: She is alert and oriented to person, place, and time. She has normal reflexes. No cranial nerve deficit or sensory deficit.  Skin: Skin is warm, dry and intact. No rash noted.  Psychiatric: She has a normal mood and affect. Her speech is normal and behavior is normal. Thought content normal.  Nursing note and vitals reviewed.   BP (!) 142/82   Pulse 77   Resp 16   Ht 5\' 3"  (1.6 m)   Wt 161 lb (73 kg)   SpO2 97%   BMI 28.52 kg/m   Assessment and Plan: 1. Essential (primary)  hypertension controlled  2. Hypothyroidism due to acquired atrophy of thyroid Supplemented - dose may need to be changed - TSH  3. Osteoarthritis of both knees, unspecified osteoarthritis type On meloxicam  4. Insomnia Using diazepam about 2x/week  5. DDD (degenerative disc disease), lumbar On meloxicam  6. OAB (overactive bladder) Begin medication - call for higher dose if desired - fesoterodine (TOVIAZ) 4 MG TB24 tablet; Take 1 tablet (4 mg total) by mouth daily.  Dispense: 30 tablet; Refill: 1  7. Need for influenza vaccination - Flu Vaccine QUAD 36+ mos IM   Halina Maidens, MD [facility]  04/22/2016

## 2016-04-23 ENCOUNTER — Other Ambulatory Visit: Payer: Self-pay | Admitting: Internal Medicine

## 2016-04-23 DIAGNOSIS — E034 Atrophy of thyroid (acquired): Secondary | ICD-10-CM

## 2016-04-23 LAB — TSH: TSH: 0.033 u[IU]/mL — AB (ref 0.450–4.500)

## 2016-04-23 MED ORDER — LEVOTHYROXINE SODIUM 112 MCG PO TABS: 112.0000 ug | ORAL_TABLET | Freq: Every day | ORAL | 1 refills | Status: DC

## 2016-05-01 ENCOUNTER — Other Ambulatory Visit: Payer: Self-pay | Admitting: Internal Medicine

## 2016-05-01 MED ORDER — TOLTERODINE TARTRATE ER 2 MG PO CP24: 2.0000 mg | ORAL_CAPSULE | Freq: Every day | ORAL | 5 refills | Status: DC

## 2016-05-07 DIAGNOSIS — R911 Solitary pulmonary nodule: Secondary | ICD-10-CM | POA: Diagnosis not present

## 2016-06-11 DIAGNOSIS — J45909 Unspecified asthma, uncomplicated: Secondary | ICD-10-CM | POA: Diagnosis not present

## 2016-06-11 DIAGNOSIS — R918 Other nonspecific abnormal finding of lung field: Secondary | ICD-10-CM | POA: Diagnosis not present

## 2016-06-11 DIAGNOSIS — R911 Solitary pulmonary nodule: Secondary | ICD-10-CM | POA: Diagnosis not present

## 2016-07-08 DIAGNOSIS — N6311 Unspecified lump in the right breast, upper outer quadrant: Secondary | ICD-10-CM | POA: Diagnosis not present

## 2016-07-24 DIAGNOSIS — N3000 Acute cystitis without hematuria: Secondary | ICD-10-CM | POA: Diagnosis not present

## 2016-07-24 DIAGNOSIS — B373 Candidiasis of vulva and vagina: Secondary | ICD-10-CM | POA: Diagnosis not present

## 2016-09-09 ENCOUNTER — Other Ambulatory Visit: Payer: Self-pay | Admitting: Internal Medicine

## 2016-10-21 ENCOUNTER — Ambulatory Visit (INDEPENDENT_AMBULATORY_CARE_PROVIDER_SITE_OTHER): Payer: Medicare Other | Admitting: Internal Medicine

## 2016-10-21 ENCOUNTER — Encounter: Payer: Self-pay | Admitting: Internal Medicine

## 2016-10-21 VITALS — BP 162/94 | HR 78 | Ht 63.0 in | Wt 163.2 lb

## 2016-10-21 DIAGNOSIS — Z0000 Encounter for general adult medical examination without abnormal findings: Secondary | ICD-10-CM | POA: Diagnosis not present

## 2016-10-21 DIAGNOSIS — E034 Atrophy of thyroid (acquired): Secondary | ICD-10-CM

## 2016-10-21 DIAGNOSIS — I10 Essential (primary) hypertension: Secondary | ICD-10-CM | POA: Diagnosis not present

## 2016-10-21 DIAGNOSIS — E785 Hyperlipidemia, unspecified: Secondary | ICD-10-CM | POA: Diagnosis not present

## 2016-10-21 DIAGNOSIS — K2100 Gastro-esophageal reflux disease with esophagitis, without bleeding: Secondary | ICD-10-CM

## 2016-10-21 DIAGNOSIS — M47816 Spondylosis without myelopathy or radiculopathy, lumbar region: Secondary | ICD-10-CM | POA: Diagnosis not present

## 2016-10-21 DIAGNOSIS — F17201 Nicotine dependence, unspecified, in remission: Secondary | ICD-10-CM

## 2016-10-21 LAB — POCT URINALYSIS DIPSTICK
BILIRUBIN UA: NEGATIVE
Blood, UA: NEGATIVE
Glucose, UA: NEGATIVE
Ketones, UA: NEGATIVE
LEUKOCYTES UA: NEGATIVE
NITRITE UA: NEGATIVE
PH UA: 6.5 (ref 5.0–8.0)
PROTEIN UA: NEGATIVE
Spec Grav, UA: 1.01 (ref 1.030–1.035)
UROBILINOGEN UA: 0.2 (ref ?–2.0)

## 2016-10-21 MED ORDER — CLOPIDOGREL BISULFATE 75 MG PO TABS: 75.0000 mg | ORAL_TABLET | Freq: Every day | ORAL | 3 refills | Status: DC

## 2016-10-21 MED ORDER — BACLOFEN 10 MG PO TABS: 10.0000 mg | ORAL_TABLET | Freq: Two times a day (BID) | ORAL | 0 refills | Status: DC | PRN

## 2016-10-21 MED ORDER — IRBESARTAN-HYDROCHLOROTHIAZIDE 300-12.5 MG PO TABS: 1.0000 | ORAL_TABLET | Freq: Every day | ORAL | 3 refills | Status: DC

## 2016-10-21 MED ORDER — MELOXICAM 15 MG PO TABS: 15.0000 mg | ORAL_TABLET | Freq: Every day | ORAL | 3 refills | Status: DC

## 2016-10-21 NOTE — Progress Notes (Signed)
Patient: Crystal Zavala, Female    DOB: 01/21/1932, 83 y.o.   MRN: [id number] Visit Date: 10/21/2016  Today's Provider: Halina Maidens, MD   Chief Complaint  Patient presents with  . Medicare Wellness   Subjective:    Annual wellness visit Crystal Zavala is a 83 y.o. female who presents today for her Subsequent Annual Wellness Visit. She feels fairly well. She reports exercising none. She reports she is sleeping well.  No breast complaints. -----------------------------------------------------------     Hypertension  This is a chronic problem. The current episode started more than 1 year ago. The problem has been waxing and waning since onset. The problem is resistant. Associated symptoms include headaches. Pertinent negatives include no chest pain, palpitations or shortness of breath. Identifiable causes of hypertension include a thyroid problem.  Hyperlipidemia  This is a chronic problem. The problem is controlled. Pertinent negatives include no chest pain or shortness of breath. Current antihyperlipidemic treatment includes statins.  Thyroid Problem  Patient reports no anxiety, constipation, diarrhea, fatigue, palpitations or tremors. Her past medical history is significant for hyperlipidemia.  Back Pain  This is a chronic problem. The problem has been gradually worsening since onset. The pain is present in the lumbar spine. Associated symptoms include headaches. Pertinent negatives include no abdominal pain, chest pain, dysuria or fever. She has tried analgesics for the symptoms. The treatment provided no relief.  Asthma  She complains of wheezing (intermittent). There is no cough or shortness of breath. Associated symptoms include headaches. Pertinent negatives include no chest pain, fever or trouble swallowing. Her symptoms are aggravated by any activity. Her past medical history is significant for asthma.  Gastroesophageal Reflux  She complains of wheezing (intermittent). She  reports no abdominal pain, no chest pain or no coughing. This is a recurrent problem. The problem occurs rarely. Pertinent negatives include no fatigue. She has tried a histamine-2 antagonist for the symptoms. The treatment provided significant relief.    Review of Systems  Constitutional: Negative for chills, fatigue and fever.  HENT: Negative for congestion, hearing loss, tinnitus, trouble swallowing and voice change.   Eyes: Negative for visual disturbance.  Respiratory: Positive for wheezing (intermittent). Negative for cough, chest tightness and shortness of breath.   Cardiovascular: Negative for chest pain, palpitations and leg swelling.  Gastrointestinal: Negative for abdominal pain, constipation, diarrhea and vomiting.  Endocrine: Negative for polydipsia and polyuria.  Genitourinary: Negative for dysuria, frequency, genital sores, vaginal bleeding and vaginal discharge.  Musculoskeletal: Positive for back pain. Negative for arthralgias, gait problem and joint swelling.  Skin: Negative for color change and rash.  Neurological: Positive for headaches. Negative for dizziness, tremors and light-headedness.  Hematological: Negative for adenopathy. Does not bruise/bleed easily.  Psychiatric/Behavioral: Negative for dysphoric mood and sleep disturbance. The patient is not nervous/anxious.     Social History   Social History  . Marital status: Widowed    Spouse name: N/A  . Number of children: N/A  . Years of education: N/A   Occupational History  . Not on file.   Social History Main Topics  . Smoking status: Former Research scientist (life sciences)  . Smokeless tobacco: Never Used  . Alcohol use No  . Drug use: No  . Sexual activity: Not on file   Other Topics Concern  . Not on file   Social History Narrative  . No narrative on file    Patient Active Problem List   Diagnosis Date Noted  . Hypothyroidism due to acquired atrophy of thyroid 04/22/2016  .  Insomnia 04/22/2016  . DDD (degenerative  disc disease), lumbar 04/22/2016  . Breast mass, right 01/10/2016  . Aortic atherosclerosis ([place]) 01/18/2015  . Arthritis of neck ([place]) 01/18/2015  . Dermatitis, eczematoid 01/18/2015  . Essential (primary) hypertension 01/18/2015  . Esophagitis, reflux 01/18/2015  . H/O adenomatous polyp of colon 01/18/2015  . Migraine without aura and responsive to treatment 01/18/2015  . Asthma, mild intermittent 01/18/2015  . Hyperlipidemia, mild 01/18/2015  . Arthritis, senescent 01/18/2015  . Lung nodule, multiple 01/18/2015  . Kidney cysts 01/18/2015  . Tobacco use disorder, moderate, in sustained remission 01/18/2015  . Temporary cerebral vascular dysfunction 01/18/2015    Past Surgical History:  Procedure Laterality Date  . ABDOMINAL HYSTERECTOMY    . BLEPHAROPLASTY Bilateral 2013  . BREAST BIOPSY Left 2009   benign  . COLONOSCOPY  2006   Normal  . ESOPHAGOGASTRODUODENOSCOPY  2015   Dilation of esophageal stricture    Her family history includes Alzheimer's disease in her brother; Diabetes in her daughter; Stroke in her father.     Previous Medications   ALBUTEROL (PROAIR HFA) 108 (90 BASE) MCG/ACT INHALER    Inhale 2 puffs into the lungs 4 (four) times daily as needed.   CLOPIDOGREL (PLAVIX) 75 MG TABLET    TAKE 1 TABLET BY MOUTH EVERY DAY   DIAZEPAM (VALIUM) 10 MG TABLET    TAKE 1 TABLET BY MOUTH NIGHTLY AS NEEDED   FLUTICASONE-SALMETEROL (ADVAIR DISKUS) 250-50 MCG/DOSE AEPB    Inhale 1 puff into the lungs 2 (two) times daily.   IRBESARTAN (AVAPRO) 300 MG TABLET    TAKE 1 TABLET BY MOUTH EVERY DAY   LEVOTHYROXINE (SYNTHROID, LEVOTHROID) 125 MCG TABLET    TAKE 1 AT BY MOUTH EVERY DAY   MELOXICAM (MOBIC) 15 MG TABLET    Take 1 tablet (15 mg total) by mouth daily.   RANITIDINE (ZANTAC) 150 MG TABLET    Take 2 tablets (300 mg total) by mouth 2 (two) times daily.    Patient Care Team: Glean Hess, MD as PCP - General (Family Medicine)      Objective:   Vitals: BP (!) 162/94  (BP Location: Right Arm, Patient Position: Sitting, Cuff Size: Normal) Comment: Pt complaining of recent high pressures with nose bleeds.  Pulse 78   Ht 5\' 3"  (1.6 m)   Wt 163 lb 3.2 oz (74 kg)   SpO2 98%   BMI 28.91 kg/m   Physical Exam  Constitutional: She is oriented to person, place, and time. She appears well-developed and well-nourished. No distress.  HENT:  Head: Normocephalic and atraumatic.  Right Ear: Tympanic membrane and ear canal normal.  Left Ear: Tympanic membrane and ear canal normal.  Nose: Right sinus exhibits no maxillary sinus tenderness. Left sinus exhibits no maxillary sinus tenderness.  Mouth/Throat: Uvula is midline and oropharynx is clear and moist.  Eyes: Conjunctivae and EOM are normal. Right eye exhibits no discharge. Left eye exhibits no discharge. No scleral icterus.  Neck: Normal range of motion. Carotid bruit is not present. No erythema present. No thyromegaly present.  Cardiovascular: Normal rate, regular rhythm, normal heart sounds and normal pulses.   Pulmonary/Chest: Effort normal. No respiratory distress. She has no wheezes. Right breast exhibits no mass, no nipple discharge, no skin change and no tenderness. Left breast exhibits no mass, no nipple discharge, no skin change and no tenderness.  Abdominal: Soft. Bowel sounds are normal. There is no hepatosplenomegaly. There is no tenderness. There is no CVA tenderness.  Musculoskeletal: She exhibits no edema.       Lumbar back: She exhibits tenderness and spasm. She exhibits no swelling and no deformity.  Tender in lateral mid back under scapula - no mass or spasm appreciated   Lymphadenopathy:    She has no cervical adenopathy.    She has no axillary adenopathy.  Neurological: She is alert and oriented to person, place, and time. She has normal reflexes. No cranial nerve deficit or sensory deficit.  Skin: Skin is warm, dry and intact. No rash noted.  Psychiatric: She has a normal mood and affect. Her  speech is normal and behavior is normal. Thought content normal.  Nursing note and vitals reviewed.   Activities of Daily Living In your present state of health, do you have any difficulty performing the following activities: 10/21/2016  Hearing? N  Vision? N  Difficulty concentrating or making decisions? N  Walking or climbing stairs? N  Dressing or bathing? N  Doing errands, shopping? N  Preparing Food and eating ? N  Using the Toilet? N  In the past six months, have you accidently leaked urine? N  Do you have problems with loss of bowel control? N  Managing your Medications? N  Managing your Finances? N  Housekeeping or managing your Housekeeping? N  Some recent data might be hidden    Fall Risk Assessment Fall Risk  10/21/2016 09/12/2015  Falls in the past year? No No      Depression Screen PHQ 2/9 Scores 10/21/2016 09/12/2015  PHQ - 2 Score 0 0   6CIT Screen 10/21/2016  What Year? 0 points  What month? 0 points  What time? 0 points  Count back from 20 0 points  Months in reverse 0 points  Repeat phrase 2 points  Total Score 2    Medicare Annual Wellness Visit Summary:  Reviewed patient's Family Medical History Reviewed and updated list of patient's medical providers Assessment of cognitive impairment was done Assessed patient's functional ability Established a written schedule for health screening [place] Completed and Reviewed  Exercise Activities and Dietary recommendations Goals    None      Immunization History  Administered Date(s) Administered  . Influenza,inj,Quad PF,36+ Mos 04/22/2016  . Influenza-Unspecified 06/06/2015  . Pneumococcal Conjugate-13 09/13/2014  . Pneumococcal Polysaccharide-23 10/03/2004  . Tdap 08/27/2011    Health Maintenance  Topic Date Due  . MAMMOGRAM  01/04/2017  . TETANUS/TDAP  08/26/2021  . INFLUENZA VACCINE  Completed  . DEXA SCAN  Completed  . PNA vac Low Risk Adult  Completed    Discussed  health benefits of physical activity, and encouraged her to engage in regular exercise appropriate for her age and condition.    ------------------------------------------------------------------------------------------------------------  Assessment & Plan:   1. Medicare annual wellness visit, subsequent Measures satisfied - POCT urinalysis dipstick  2. Essential (primary) hypertension Add HCTZ to Avapro - Comprehensive metabolic panel  3. Esophagitis, reflux stable - CBC with Differential/Platelet  4. Hypothyroidism due to acquired atrophy of thyroid supplemented - TSH  5. Hyperlipidemia, mild Continue low fat diet - Lipid panel  6. Tobacco use disorder, moderate, in sustained remission  7. Spondylosis of lumbar region without myelopathy or radiculopathy Continue Meloxicam Add Baclofen   Meds ordered this encounter  Medications  . clopidogrel (PLAVIX) 75 MG tablet    Sig: Take 1 tablet (75 mg total) by mouth daily.    Dispense:  90 tablet    Refill:  3  . meloxicam (MOBIC)  15 MG tablet    Sig: Take 1 tablet (15 mg total) by mouth daily.    Dispense:  90 tablet    Refill:  3  . irbesartan-hydrochlorothiazide (AVALIDE) 300-12.5 MG tablet    Sig: Take 1 tablet by mouth daily.    Dispense:  90 tablet    Refill:  3  . baclofen (LIORESAL) 10 MG tablet    Sig: Take 1 tablet (10 mg total) by mouth 2 (two) times daily as needed for muscle spasms.    Dispense:  60 each    Refill:  0    Halina Maidens, MD [facility]  10/21/2016

## 2016-10-21 NOTE — Patient Instructions (Signed)
Health Maintenance  Topic Date Due  . MAMMOGRAM  01/04/2017  . TETANUS/TDAP  08/26/2021  . INFLUENZA VACCINE  Completed  . DEXA SCAN  Completed  . PNA vac Low Risk Adult  Completed

## 2016-10-22 LAB — CBC WITH DIFFERENTIAL/PLATELET
BASOS: 1 %
Basophils Absolute: 0.1 10*3/uL (ref 0.0–0.2)
EOS (ABSOLUTE): 0.2 10*3/uL (ref 0.0–0.4)
Eos: 4 %
HEMATOCRIT: 40.4 % (ref 34.0–46.6)
Hemoglobin: 12.8 g/dL (ref 11.1–15.9)
Immature Grans (Abs): 0 10*3/uL (ref 0.0–0.1)
Immature Granulocytes: 0 %
LYMPHS ABS: 1.7 10*3/uL (ref 0.7–3.1)
Lymphs: 26 %
MCH: 30.2 pg (ref 26.6–33.0)
MCHC: 31.7 g/dL (ref 31.5–35.7)
MCV: 95 fL (ref 79–97)
Monocytes Absolute: 0.5 10*3/uL (ref 0.1–0.9)
Monocytes: 8 %
Neutrophils Absolute: 4 10*3/uL (ref 1.4–7.0)
Neutrophils: 61 %
PLATELETS: 259 10*3/uL (ref 150–379)
RBC: 4.24 x10E6/uL (ref 3.77–5.28)
RDW: 13.8 % (ref 12.3–15.4)
WBC: 6.5 10*3/uL (ref 3.4–10.8)

## 2016-10-22 LAB — COMPREHENSIVE METABOLIC PANEL
A/G RATIO: 1.6 (ref 1.2–2.2)
ALK PHOS: 103 IU/L (ref 39–117)
ALT: 13 IU/L (ref 0–32)
AST: 19 IU/L (ref 0–40)
Albumin: 3.9 g/dL (ref 3.5–4.7)
BUN/Creatinine Ratio: 21 (ref 12–28)
BUN: 23 mg/dL (ref 8–27)
Bilirubin Total: 0.6 mg/dL (ref 0.0–1.2)
CALCIUM: 10 mg/dL (ref 8.7–10.3)
CHLORIDE: 106 mmol/L (ref 96–106)
CO2: 24 mmol/L (ref 18–29)
Creatinine, Ser: 1.09 mg/dL — ABNORMAL HIGH (ref 0.57–1.00)
GFR calc Af Amer: 54 mL/min/{1.73_m2} — ABNORMAL LOW (ref >59)
GFR calc non Af Amer: 47 mL/min/{1.73_m2} — ABNORMAL LOW (ref >59)
GLOBULIN, TOTAL: 2.4 g/dL (ref 1.5–4.5)
Glucose: 94 mg/dL (ref 65–99)
POTASSIUM: 5 mmol/L (ref 3.5–5.2)
SODIUM: 144 mmol/L (ref 134–144)
Total Protein: 6.3 g/dL (ref 6.0–8.5)

## 2016-10-22 LAB — LIPID PANEL
Chol/HDL Ratio: 3 ratio units (ref 0.0–4.4)
Cholesterol, Total: 215 mg/dL — ABNORMAL HIGH (ref 100–199)
HDL: 71 mg/dL (ref >39)
LDL Calculated: 129 mg/dL — ABNORMAL HIGH (ref 0–99)
TRIGLYCERIDES: 74 mg/dL (ref 0–149)
VLDL Cholesterol Cal: 15 mg/dL (ref 5–40)

## 2016-10-22 LAB — TSH: TSH: 0.056 u[IU]/mL — ABNORMAL LOW (ref 0.450–4.500)

## 2016-11-15 ENCOUNTER — Other Ambulatory Visit: Payer: Self-pay | Admitting: Internal Medicine

## 2016-11-26 DIAGNOSIS — M5136 Other intervertebral disc degeneration, lumbar region: Secondary | ICD-10-CM | POA: Diagnosis not present

## 2016-11-30 DIAGNOSIS — M5136 Other intervertebral disc degeneration, lumbar region: Secondary | ICD-10-CM | POA: Diagnosis not present

## 2016-12-03 ENCOUNTER — Telehealth: Payer: Self-pay

## 2016-12-03 ENCOUNTER — Encounter: Payer: Self-pay | Admitting: Internal Medicine

## 2016-12-03 ENCOUNTER — Other Ambulatory Visit: Payer: Self-pay | Admitting: Internal Medicine

## 2016-12-03 DIAGNOSIS — D480 Neoplasm of uncertain behavior of bone and articular cartilage: Secondary | ICD-10-CM | POA: Insufficient documentation

## 2016-12-03 DIAGNOSIS — R2241 Localized swelling, mass and lump, right lower limb: Secondary | ICD-10-CM | POA: Diagnosis not present

## 2016-12-03 NOTE — Telephone Encounter (Signed)
Spoke with Dr. Tacy Dura by phone.  He has been seeing Crystal Zavala for back pain and ordered an MRI which revealed a 5 cm mass in the right psoas muscle.  The mass appears concerning for malignancy.  Crystal Zavala has been informed of the findings and the need for further evaluation by Dr. Tacy Dura.  Once I receive the MRI report, I will call Crystal Zavala to discuss referral.

## 2016-12-03 NOTE — Telephone Encounter (Signed)
Dr.Zimmerman called from [facility] today after finding Mass on patient exam. Please call him ASAP he gave his cell and asked that we call him asap. [phone].

## 2016-12-05 DIAGNOSIS — R938 Abnormal findings on diagnostic imaging of other specified body structures: Secondary | ICD-10-CM | POA: Diagnosis not present

## 2016-12-05 DIAGNOSIS — D421 Neoplasm of uncertain behavior of spinal meninges: Secondary | ICD-10-CM | POA: Diagnosis not present

## 2016-12-09 ENCOUNTER — Other Ambulatory Visit: Payer: Self-pay | Admitting: Internal Medicine

## 2016-12-16 DIAGNOSIS — M5136 Other intervertebral disc degeneration, lumbar region: Secondary | ICD-10-CM | POA: Diagnosis not present

## 2016-12-16 DIAGNOSIS — D421 Neoplasm of uncertain behavior of spinal meninges: Secondary | ICD-10-CM | POA: Diagnosis not present

## 2016-12-16 DIAGNOSIS — R222 Localized swelling, mass and lump, trunk: Secondary | ICD-10-CM | POA: Diagnosis not present

## 2016-12-18 ENCOUNTER — Telehealth: Payer: Self-pay

## 2016-12-18 DIAGNOSIS — R040 Epistaxis: Secondary | ICD-10-CM | POA: Diagnosis not present

## 2016-12-18 NOTE — Telephone Encounter (Signed)
Pt called stating she went to have Mri done and they could not do dye contrast because of her recent labs saying her Creatinine is 1.7. Was told to contcat doctor to find out how to get this back to normal?

## 2016-12-18 NOTE — Telephone Encounter (Signed)
She will need a follow up sometime in the next week.  Try to hydrate well until then.

## 2016-12-19 DIAGNOSIS — M47816 Spondylosis without myelopathy or radiculopathy, lumbar region: Secondary | ICD-10-CM | POA: Diagnosis not present

## 2016-12-19 DIAGNOSIS — M5416 Radiculopathy, lumbar region: Secondary | ICD-10-CM | POA: Diagnosis not present

## 2016-12-19 DIAGNOSIS — D3610 Benign neoplasm of peripheral nerves and autonomic nervous system, unspecified: Secondary | ICD-10-CM | POA: Diagnosis not present

## 2016-12-19 NOTE — Telephone Encounter (Signed)
Tried contacting pt -unable to leave VM for her phone. -- Awaiting pt call back to discuss and inform to make appt with NP for OV and labs and NP can give recommendations for pt.

## 2016-12-24 ENCOUNTER — Ambulatory Visit (INDEPENDENT_AMBULATORY_CARE_PROVIDER_SITE_OTHER): Payer: Medicare Other | Admitting: Physician Assistant

## 2016-12-24 VITALS — BP 120/64 | HR 81 | Ht 63.0 in | Wt 162.4 lb

## 2016-12-24 DIAGNOSIS — I10 Essential (primary) hypertension: Secondary | ICD-10-CM | POA: Diagnosis not present

## 2016-12-24 DIAGNOSIS — R7989 Other specified abnormal findings of blood chemistry: Secondary | ICD-10-CM

## 2016-12-24 DIAGNOSIS — R799 Abnormal finding of blood chemistry, unspecified: Secondary | ICD-10-CM | POA: Diagnosis not present

## 2016-12-24 NOTE — Progress Notes (Signed)
Subjective:    Patient ID: Crystal Zavala, female    DOB: August 05, 1932, 83 y.o.   MRN: [id number]  Crystal Zavala is a 83 y.o. female presenting on 12/24/2016 for Labs Only (Just need creatnine rechecked.)  HPI   Crystal Zavala is an 83 y/o woman with a history of HTN recently put on Avalide 300-12.5 mg at her last visit on 10/21/2016, being evaluated for a potential Schwannoma at [facility] presenting today for recheck of labs for abnormal Serum creatinine. Last value from our clinic on 10/21/2016 shows Scr is 1.09, GFR 47 and up from 0.87 Scr and down from GFR 62 one year ago. The patient presented to [facility] and had a stat creatinine last week which was 1.7. She reports that she was fasting when she had the stat creatinine. She also reports she is taking 15 mg Meloxicam daily for arthritis pain. She does not know if this is helping her anymore. No flank pain, dysuria, problems urinating.   Social History  Substance Use Topics  . Smoking status: Former Research scientist (life sciences)  . Smokeless tobacco: Never Used  . Alcohol use No    Review of Systems Per HPI unless specifically indicated above     Objective:    BP 120/64 (BP Location: Right Arm, Patient Position: Sitting, Cuff Size: Normal)   Pulse 81   Ht 5\' 3"  (1.6 m)   Wt 162 lb 6.4 oz (73.7 kg)   SpO2 98%   BMI 28.77 kg/m   Wt Readings from Last 3 Encounters:  12/24/16 162 lb 6.4 oz (73.7 kg)  10/21/16 163 lb 3.2 oz (74 kg)  04/22/16 161 lb (73 kg)    Physical Exam  Constitutional: She is oriented to person, place, and time. She appears well-developed and well-nourished.  Cardiovascular: Normal rate and regular rhythm.   Pulmonary/Chest: Effort normal and breath sounds normal.  Abdominal: Soft. There is no CVA tenderness.  Neurological: She is alert and oriented to person, place, and time.  Skin: Skin is warm and dry.  Psychiatric: She has a normal mood and affect. Her behavior is normal.   Results for orders placed or performed in visit  on 10/21/16  CBC with Differential/Platelet  Result Value Ref Range   WBC 6.5 3.4 - 10.8 x10E3/uL   RBC 4.24 3.77 - 5.28 x10E6/uL   Hemoglobin 12.8 11.1 - 15.9 g/dL   Hematocrit 40.4 34.0 - 46.6 %   MCV 95 79 - 97 fL   MCH 30.2 26.6 - 33.0 pg   MCHC 31.7 31.5 - 35.7 g/dL   RDW 13.8 12.3 - 15.4 %   Platelets 259 150 - 379 x10E3/uL   Neutrophils 61 Not Estab. %   Lymphs 26 Not Estab. %   Monocytes 8 Not Estab. %   Eos 4 Not Estab. %   Basos 1 Not Estab. %   Neutrophils Absolute 4.0 1.4 - 7.0 x10E3/uL   Lymphocytes Absolute 1.7 0.7 - 3.1 x10E3/uL   Monocytes Absolute 0.5 0.1 - 0.9 x10E3/uL   EOS (ABSOLUTE) 0.2 0.0 - 0.4 x10E3/uL   Basophils Absolute 0.1 0.0 - 0.2 x10E3/uL   Immature Granulocytes 0 Not Estab. %   Immature Grans (Abs) 0.0 0.0 - 0.1 x10E3/uL  Comprehensive metabolic panel  Result Value Ref Range   Glucose 94 65 - 99 mg/dL   BUN 23 8 - 27 mg/dL   Creatinine, Ser 1.09 (H) 0.57 - 1.00 mg/dL   GFR calc non Af Amer 47 (L) >59  mL/min/1.73   GFR calc Af Amer 54 (L) >59 mL/min/1.73   BUN/Creatinine Ratio 21 12 - 28   Sodium 144 134 - 144 mmol/L   Potassium 5.0 3.5 - 5.2 mmol/L   Chloride 106 96 - 106 mmol/L   CO2 24 18 - 29 mmol/L   Calcium 10.0 8.7 - 10.3 mg/dL   Total Protein 6.3 6.0 - 8.5 g/dL   Albumin 3.9 3.5 - 4.7 g/dL   Globulin, Total 2.4 1.5 - 4.5 g/dL   Albumin/Globulin Ratio 1.6 1.2 - 2.2   Bilirubin Total 0.6 0.0 - 1.2 mg/dL   Alkaline Phosphatase 103 39 - 117 IU/L   AST 19 0 - 40 IU/L   ALT 13 0 - 32 IU/L  Lipid panel  Result Value Ref Range   Cholesterol, Total 215 (H) 100 - 199 mg/dL   Triglycerides 74 0 - 149 mg/dL   HDL 71 >39 mg/dL   VLDL Cholesterol Cal 15 5 - 40 mg/dL   LDL Calculated 129 (H) 0 - 99 mg/dL   Chol/HDL Ratio 3.0 0.0 - 4.4 ratio units  TSH  Result Value Ref Range   TSH 0.056 (L) 0.450 - 4.500 uIU/mL  POCT urinalysis dipstick  Result Value Ref Range   Color, UA yellow    Clarity, UA clear    Glucose, UA neg    Bilirubin,  UA neg    Ketones, UA neg    Spec Grav, UA 1.010 1.030 - 1.035   Blood, UA neg    pH, UA 6.5 5.0 - 8.0   Protein, UA neg    Urobilinogen, UA 0.2 Negative - 2.0   Nitrite, UA neg    Leukocytes, UA Negative Negative      Assessment & Plan:   1. Abnormal serum creatinine level  Recheck labs today. Encouraged adequate hydration. Also advised her to discontinue Meloxicam to help with this, especially if it is not giving much benefit. She is also taking extra strength Tyelnol.   - Comprehensive metabolic panel  2. Essential (primary) hypertension  Stable today, currently on Avalide.   Carles Collet, [facility] 12/24/2016, 2:03 PM

## 2016-12-24 NOTE — Patient Instructions (Signed)
Keep well hydrated, please try to stop Meloxicam to help with this level.    Serum Creatinine Test Why am I having this test? A creatinine test is performed to measure the amount of creatinine in your blood (serum). Creatinine is a waste product of normal muscle activity (contraction). The kidneys filter creatinine from your blood and remove it from your body through urination. Blood creatinine levels stay consistent in people whose muscle mass stays consistent. This level can be increased in people who perform resistance exercise to increase muscle mass. Because creatinine is removed from your body by the kidneys, this test is often used as a way to measure kidney function. Your health care provider may recommend this test if he or she suspects that you have a condition that is negatively affecting your kidney function. This test may also be done as a part of routine blood work used to assess your overall health. What kind of sample is taken? A blood sample is required for this test. It is usually collected by inserting a needle into a vein or by sticking a finger with a small needle. For children, the blood sample is usually collected by sticking the child's heel with a small needle. How do I prepare for this test? There is no preparation or fasting required for this test. What are the reference ranges? Reference ranges are considered healthy ranges established after testing a large group of healthy people. Reference ranges may vary among different people, labs, and hospitals. It is your responsibility to obtain your test results. Ask the lab or department performing the test when and how you will get your results.  Children or adolescents:  Less than 79 years old: 0.1-0.4 mg/dL.  27-77 years old: 0.2-0.5 mg/dL.  85-68 years old: 0.3-0.6 mg/dL.  8-83 years old: 0.4-1 mg/dL.  Adult female:  67-55 years old: 0.5-1 mg/dL.  9-33 years old: 0.5-1.1 mg/dL.  61 years and older: 0.5-1.2  mg/dL.  Adult female:  49-54 years old: 0.6-1.2 mg/dL.  24-79 years old: 0.6-1.3 mg/dL.  61 years and older: 0.7-1.3 mg/dL. The reference range may be higher in people who perform resistance exercise to increase muscle mass. What do the results mean? Abnormally high levels of serum creatinine can be caused by many health conditions. These may include:  Kidney disease.  Urinary tract obstruction.  Lower-than-normal blood flow to the kidneys.  Rhabdomyolysis. This occurs when muscle damage causes the release of molecules into the bloodstream, which results in kidney damage.  Acromegaly. This is a condition that causes enlarged bones.  Gigantism. Abnormally low levels of serum creatinine can also be caused by many health conditions. These may include:  Conditions that cause you to be inactive throughout much of the day.  Decreased muscle mass. Talk with your health care provider to discuss your results, treatment options, and if necessary, the need for more tests. Talk with your health care provider if you have any questions about your results. Talk with your health care provider to discuss your results, treatment options, and if necessary, the need for more tests. Talk with your health care provider if you have any questions about your results. This information is not intended to replace advice given to you by your health care provider. Make sure you discuss any questions you have with your health care provider. Document Released: 08/14/2004 Document Revised: 03/16/2016 Document Reviewed: 12/16/2013 Elsevier Interactive Patient Education  2017 [employer].

## 2016-12-25 ENCOUNTER — Other Ambulatory Visit: Payer: Self-pay | Admitting: Physician Assistant

## 2016-12-25 ENCOUNTER — Other Ambulatory Visit: Payer: Self-pay

## 2016-12-25 DIAGNOSIS — R7989 Other specified abnormal findings of blood chemistry: Secondary | ICD-10-CM

## 2016-12-25 LAB — COMPREHENSIVE METABOLIC PANEL
ALT: 16 IU/L (ref 0–32)
AST: 18 IU/L (ref 0–40)
Albumin/Globulin Ratio: 1.8 (ref 1.2–2.2)
Albumin: 4.1 g/dL (ref 3.5–4.7)
Alkaline Phosphatase: 104 IU/L (ref 39–117)
BUN/Creatinine Ratio: 21 (ref 12–28)
BUN: 31 mg/dL — ABNORMAL HIGH (ref 8–27)
Bilirubin Total: 0.4 mg/dL (ref 0.0–1.2)
CO2: 22 mmol/L (ref 18–29)
Calcium: 9.8 mg/dL (ref 8.7–10.3)
Chloride: 105 mmol/L (ref 96–106)
Creatinine, Ser: 1.47 mg/dL — ABNORMAL HIGH (ref 0.57–1.00)
GFR calc Af Amer: 38 mL/min/{1.73_m2} — ABNORMAL LOW (ref >59)
GFR calc non Af Amer: 33 mL/min/{1.73_m2} — ABNORMAL LOW (ref >59)
Globulin, Total: 2.3 g/dL (ref 1.5–4.5)
Glucose: 97 mg/dL (ref 65–99)
Potassium: 5 mmol/L (ref 3.5–5.2)
Sodium: 140 mmol/L (ref 134–144)
Total Protein: 6.4 g/dL (ref 6.0–8.5)

## 2016-12-25 MED ORDER — IRBESARTAN 300 MG PO TABS: 300.0000 mg | ORAL_TABLET | Freq: Every day | ORAL | 0 refills | Status: DC

## 2016-12-25 NOTE — Progress Notes (Signed)
Lab slip printed and should be waiting up front for patient

## 2017-01-01 DIAGNOSIS — M5136 Other intervertebral disc degeneration, lumbar region: Secondary | ICD-10-CM | POA: Diagnosis not present

## 2017-01-01 DIAGNOSIS — M5416 Radiculopathy, lumbar region: Secondary | ICD-10-CM | POA: Diagnosis not present

## 2017-01-02 ENCOUNTER — Telehealth: Payer: Self-pay

## 2017-01-02 DIAGNOSIS — R7989 Other specified abnormal findings of blood chemistry: Secondary | ICD-10-CM | POA: Diagnosis not present

## 2017-01-02 NOTE — Telephone Encounter (Signed)
Picked up forms

## 2017-01-03 HISTORY — PX: OTHER SURGICAL HISTORY: SHX169

## 2017-01-03 LAB — COMPREHENSIVE METABOLIC PANEL
ALT: 18 IU/L (ref 0–32)
AST: 21 IU/L (ref 0–40)
Albumin/Globulin Ratio: 1.6 (ref 1.2–2.2)
Albumin: 4.1 g/dL (ref 3.5–4.7)
Alkaline Phosphatase: 108 IU/L (ref 39–117)
BUN/Creatinine Ratio: 23 (ref 12–28)
BUN: 24 mg/dL (ref 8–27)
Bilirubin Total: 0.5 mg/dL (ref 0.0–1.2)
CO2: 22 mmol/L (ref 18–29)
Calcium: 10.3 mg/dL (ref 8.7–10.3)
Chloride: 104 mmol/L (ref 96–106)
Creatinine, Ser: 1.05 mg/dL — ABNORMAL HIGH (ref 0.57–1.00)
GFR calc Af Amer: 57 mL/min/{1.73_m2} — ABNORMAL LOW (ref >59)
GFR calc non Af Amer: 49 mL/min/{1.73_m2} — ABNORMAL LOW (ref >59)
Globulin, Total: 2.5 g/dL (ref 1.5–4.5)
Glucose: 87 mg/dL (ref 65–99)
Potassium: 4.8 mmol/L (ref 3.5–5.2)
Sodium: 139 mmol/L (ref 134–144)
Total Protein: 6.6 g/dL (ref 6.0–8.5)

## 2017-01-05 ENCOUNTER — Other Ambulatory Visit: Payer: Self-pay | Admitting: Internal Medicine

## 2017-01-05 DIAGNOSIS — D480 Neoplasm of uncertain behavior of bone and articular cartilage: Secondary | ICD-10-CM

## 2017-01-05 DIAGNOSIS — N289 Disorder of kidney and ureter, unspecified: Secondary | ICD-10-CM

## 2017-01-06 ENCOUNTER — Encounter: Payer: Self-pay | Admitting: Internal Medicine

## 2017-01-06 DIAGNOSIS — R928 Other abnormal and inconclusive findings on diagnostic imaging of breast: Secondary | ICD-10-CM | POA: Diagnosis not present

## 2017-01-06 LAB — HM MAMMOGRAPHY

## 2017-01-13 DIAGNOSIS — M5416 Radiculopathy, lumbar region: Secondary | ICD-10-CM | POA: Diagnosis not present

## 2017-01-13 DIAGNOSIS — M5136 Other intervertebral disc degeneration, lumbar region: Secondary | ICD-10-CM | POA: Diagnosis not present

## 2017-01-23 ENCOUNTER — Ambulatory Visit (INDEPENDENT_AMBULATORY_CARE_PROVIDER_SITE_OTHER): Payer: Medicare Other | Admitting: Internal Medicine

## 2017-01-23 ENCOUNTER — Encounter: Payer: Self-pay | Admitting: Internal Medicine

## 2017-01-23 VITALS — BP 118/78 | HR 84 | Ht 63.0 in | Wt 162.0 lb

## 2017-01-23 DIAGNOSIS — M4692 Unspecified inflammatory spondylopathy, cervical region: Secondary | ICD-10-CM | POA: Diagnosis not present

## 2017-01-23 DIAGNOSIS — M47812 Spondylosis without myelopathy or radiculopathy, cervical region: Secondary | ICD-10-CM

## 2017-01-23 DIAGNOSIS — E034 Atrophy of thyroid (acquired): Secondary | ICD-10-CM | POA: Diagnosis not present

## 2017-01-23 DIAGNOSIS — N289 Disorder of kidney and ureter, unspecified: Secondary | ICD-10-CM | POA: Diagnosis not present

## 2017-01-23 DIAGNOSIS — I10 Essential (primary) hypertension: Secondary | ICD-10-CM | POA: Diagnosis not present

## 2017-01-23 MED ORDER — LEVOTHYROXINE SODIUM 112 MCG PO TABS: 112.0000 ug | ORAL_TABLET | Freq: Every day | ORAL | 3 refills | Status: DC

## 2017-01-23 MED ORDER — IRBESARTAN 300 MG PO TABS: 300.0000 mg | ORAL_TABLET | Freq: Every day | ORAL | 3 refills | Status: DC

## 2017-01-23 NOTE — Progress Notes (Signed)
Date:  01/23/2017   Name:  Crystal Zavala   DOB:  05/25/1932   MRN:  [id number]   Chief Complaint: Hypertension Carles Collet took her off hydrochlorothiazide due to high creatnine. Only taking regular irbasartan. ) Hypertension  Associated symptoms include neck pain. Pertinent negatives include no chest pain, headaches, palpitations or shortness of breath.  Neck Pain   This is a chronic problem. The problem occurs intermittently. The pain is mild. Pertinent negatives include no chest pain, fever or headaches. She has tried NSAIDs for the symptoms. The treatment provided significant relief.  Renal Insufficiency - had improved after stopping HCTZ and Mobic.  She still has significant neck and mid back pain and has been taking some Mobic again.  Tylenol used PRN did not seem to help much.    Review of Systems  Constitutional: Negative for chills, fatigue and fever.  Respiratory: Negative for chest tightness and shortness of breath.   Cardiovascular: Negative for chest pain, palpitations and leg swelling.  Gastrointestinal: Negative for abdominal pain.  Genitourinary: Negative for dysuria.  Musculoskeletal: Positive for arthralgias, back pain and neck pain.  Skin: Negative for rash.  Neurological: Negative for dizziness and headaches.    Patient Active Problem List   Diagnosis Date Noted  . Renal insufficiency 01/05/2017  . 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 Spondylosis of lumbar region without myelopathy or radiculopathy 10/21/2016  . Hypothyroidism due to acquired atrophy of thyroid 04/22/2016  . Insomnia 04/22/2016  . DDD (degenerative disc disease), lumbar 04/22/2016  . Breast mass, right 01/10/2016  . Aortic atherosclerosis ([place]) 01/18/2015  . Arthritis of neck ([place]) 01/18/2015  . Dermatitis, eczematoid 01/18/2015  . Essential (primary) hypertension 01/18/2015  . Esophagitis, reflux 01/18/2015  . H/O adenomatous polyp of colon  01/18/2015  . Migraine without aura and responsive to treatment 01/18/2015  . Asthma, mild intermittent 01/18/2015  . Hyperlipidemia, mild 01/18/2015  . Lung nodule, multiple 01/18/2015  . Kidney cysts 01/18/2015  . Tobacco use disorder, moderate, in sustained remission 01/18/2015  . Temporary cerebral vascular dysfunction 01/18/2015    Prior to Admission medications   Medication Sig Start Date End Date Taking? Authorizing Provider  acetaminophen (TYLENOL) 500 MG tablet Take by mouth.   Yes [provider]  albuterol (PROAIR HFA) 108 (90 Base) MCG/ACT inhaler Inhale 2 puffs into the lungs 4 (four) times daily as needed. 09/12/15  Yes Glean Hess, MD  clopidogrel (PLAVIX) 75 MG tablet Take 1 tablet (75 mg total) by mouth daily. 10/21/16  Yes Glean Hess, MD  diazepam (VALIUM) 10 MG tablet TAKE 1 TABLET BY MOUTH NIGHTLY AS NEEDED 04/05/16  Yes Glean Hess, MD  Fluticasone-Salmeterol (ADVAIR DISKUS) 250-50 MCG/DOSE AEPB Inhale 1 puff into the lungs 2 (two) times daily. 09/12/15  Yes Glean Hess, MD  irbesartan (AVAPRO) 300 MG tablet Take 1 tablet (300 mg total) by mouth daily. 12/25/16  Yes Glean Hess, MD  levothyroxine (SYNTHROID, LEVOTHROID) 112 MCG tablet Take 112 mcg by mouth daily before breakfast.   Yes [provider]  levothyroxine (SYNTHROID, LEVOTHROID) 125 MCG tablet TAKE 1 AT BY MOUTH EVERY DAY 12/09/16  Yes Glean Hess, MD  meloxicam (MOBIC) 15 MG tablet Take 1 tablet (15 mg total) by mouth daily. 10/21/16  Yes Glean Hess, MD  pantoprazole (PROTONIX) 40 MG tablet Take 40 mg by mouth daily.   Yes [provider]  ranitidine (ZANTAC) 150 MG tablet Take 2  tablets (300 mg total) by mouth 2 (two) times daily. 09/12/15  Yes Glean Hess, MD    Allergies  Allergen Reactions  . Ace Inhibitors Cough    Past Surgical History:  Procedure Laterality Date  . ABDOMINAL HYSTERECTOMY    . BLEPHAROPLASTY Bilateral 2013  .  BREAST BIOPSY Left 2009   benign  . COLONOSCOPY  2006   Normal  . ESI  01/2017   L5-S1 @ [facility]  . ESOPHAGOGASTRODUODENOSCOPY  2015   Dilation of esophageal stricture    Social History  Substance Use Topics  . Smoking status: Former Research scientist (life sciences)  . Smokeless tobacco: Never Used  . Alcohol use No     Medication list has been reviewed and updated.   Physical Exam  Constitutional: She is oriented to person, place, and time. She appears well-developed. No distress.  HENT:  Head: Normocephalic and atraumatic.  Neck: Normal range of motion. Neck supple. No thyromegaly present.  Cardiovascular: Normal rate, regular rhythm and normal heart sounds.   Pulmonary/Chest: Effort normal and breath sounds normal. No respiratory distress. She has no wheezes.  Musculoskeletal: She exhibits no edema.       Thoracic back: She exhibits decreased range of motion and tenderness.  Neurological: She is alert and oriented to person, place, and time.  Skin: Skin is warm and dry. No rash noted.  Psychiatric: She has a normal mood and affect. Her speech is normal and behavior is normal. Thought content normal.  Nursing note and vitals reviewed.   BP 118/78   Pulse 84   Ht 5\' 3"  (1.6 m)   Wt 162 lb (73.5 kg)   SpO2 98%   BMI 28.70 kg/m   Assessment and Plan: 1. Essential (primary) hypertension controlled - irbesartan (AVAPRO) 300 MG tablet; Take 1 tablet (300 mg total) by mouth daily.  Dispense: 90 tablet; Refill: 3  2. Arthritis of neck ([place]) Try tylenol dosed bid routinely Follow up with Orthopedics  3. Renal insufficiency Recheck labs Avoid nsaids as much as possible - Basic metabolic panel  4. Hypothyroidism due to acquired atrophy of thyroid Adjust dose if needed - TSH - levothyroxine (SYNTHROID, LEVOTHROID) 112 MCG tablet; Take 1 tablet (112 mcg total) by mouth daily before breakfast.  Dispense: 90 tablet; Refill: 3   Meds ordered this encounter  Medications  . irbesartan (AVAPRO)  300 MG tablet    Sig: Take 1 tablet (300 mg total) by mouth daily.    Dispense:  90 tablet    Refill:  3  . levothyroxine (SYNTHROID, LEVOTHROID) 112 MCG tablet    Sig: Take 1 tablet (112 mcg total) by mouth daily before breakfast.    Dispense:  90 tablet    Refill:  3   Will advise on follow up when labs return.  Halina Maidens, MD [facility]  01/23/2017

## 2017-01-24 ENCOUNTER — Other Ambulatory Visit: Payer: Self-pay | Admitting: Internal Medicine

## 2017-01-24 DIAGNOSIS — N183 Chronic kidney disease, stage 3 unspecified: Secondary | ICD-10-CM

## 2017-01-24 LAB — BASIC METABOLIC PANEL
BUN/Creatinine Ratio: 21 (ref 12–28)
BUN: 26 mg/dL (ref 8–27)
CALCIUM: 10.1 mg/dL (ref 8.7–10.3)
CO2: 22 mmol/L (ref 20–29)
CREATININE: 1.22 mg/dL — AB (ref 0.57–1.00)
Chloride: 103 mmol/L (ref 96–106)
GFR, EST AFRICAN AMERICAN: 47 mL/min/{1.73_m2} — AB (ref >59)
GFR, EST NON AFRICAN AMERICAN: 41 mL/min/{1.73_m2} — AB (ref >59)
Glucose: 84 mg/dL (ref 65–99)
Potassium: 4.9 mmol/L (ref 3.5–5.2)
SODIUM: 140 mmol/L (ref 134–144)

## 2017-01-24 LAB — TSH: TSH: 0.042 u[IU]/mL — ABNORMAL LOW (ref 0.450–4.500)

## 2017-01-27 ENCOUNTER — Telehealth: Payer: Self-pay

## 2017-01-27 NOTE — Telephone Encounter (Signed)
Pt called stating Dr. Claudia Desanctis wants to do another epidural on her back. His office is requesting we send over a approval note or script statin we approve for her to be off her Plavix for another 7 days. If we approve we need to send this to [facility]- [phone]. Please Advise.

## 2017-01-27 NOTE — Telephone Encounter (Signed)
Faxed note over and informed pt it was sent to approve for 7 days off plavix.

## 2017-01-27 NOTE — Telephone Encounter (Signed)
Ready to be sent

## 2017-01-28 DIAGNOSIS — M545 Low back pain: Secondary | ICD-10-CM | POA: Diagnosis not present

## 2017-01-28 DIAGNOSIS — M25551 Pain in right hip: Secondary | ICD-10-CM | POA: Diagnosis not present

## 2017-01-28 DIAGNOSIS — R293 Abnormal posture: Secondary | ICD-10-CM | POA: Diagnosis not present

## 2017-01-28 DIAGNOSIS — M546 Pain in thoracic spine: Secondary | ICD-10-CM | POA: Diagnosis not present

## 2017-01-28 DIAGNOSIS — M6281 Muscle weakness (generalized): Secondary | ICD-10-CM | POA: Diagnosis not present

## 2017-01-30 DIAGNOSIS — M25551 Pain in right hip: Secondary | ICD-10-CM | POA: Diagnosis not present

## 2017-01-30 DIAGNOSIS — M545 Low back pain: Secondary | ICD-10-CM | POA: Diagnosis not present

## 2017-01-30 DIAGNOSIS — M546 Pain in thoracic spine: Secondary | ICD-10-CM | POA: Diagnosis not present

## 2017-01-30 DIAGNOSIS — M6281 Muscle weakness (generalized): Secondary | ICD-10-CM | POA: Diagnosis not present

## 2017-01-30 DIAGNOSIS — R293 Abnormal posture: Secondary | ICD-10-CM | POA: Diagnosis not present

## 2017-02-04 DIAGNOSIS — M545 Low back pain: Secondary | ICD-10-CM | POA: Diagnosis not present

## 2017-02-04 DIAGNOSIS — M6281 Muscle weakness (generalized): Secondary | ICD-10-CM | POA: Diagnosis not present

## 2017-02-04 DIAGNOSIS — M546 Pain in thoracic spine: Secondary | ICD-10-CM | POA: Diagnosis not present

## 2017-02-04 DIAGNOSIS — M25551 Pain in right hip: Secondary | ICD-10-CM | POA: Diagnosis not present

## 2017-02-04 DIAGNOSIS — R293 Abnormal posture: Secondary | ICD-10-CM | POA: Diagnosis not present

## 2017-02-06 DIAGNOSIS — R293 Abnormal posture: Secondary | ICD-10-CM | POA: Diagnosis not present

## 2017-02-06 DIAGNOSIS — M6281 Muscle weakness (generalized): Secondary | ICD-10-CM | POA: Diagnosis not present

## 2017-02-06 DIAGNOSIS — M545 Low back pain: Secondary | ICD-10-CM | POA: Diagnosis not present

## 2017-02-06 DIAGNOSIS — M546 Pain in thoracic spine: Secondary | ICD-10-CM | POA: Diagnosis not present

## 2017-02-06 DIAGNOSIS — M25551 Pain in right hip: Secondary | ICD-10-CM | POA: Diagnosis not present

## 2017-02-10 DIAGNOSIS — M5416 Radiculopathy, lumbar region: Secondary | ICD-10-CM | POA: Diagnosis not present

## 2017-02-10 DIAGNOSIS — M5136 Other intervertebral disc degeneration, lumbar region: Secondary | ICD-10-CM | POA: Diagnosis not present

## 2017-02-13 DIAGNOSIS — M25551 Pain in right hip: Secondary | ICD-10-CM | POA: Diagnosis not present

## 2017-02-13 DIAGNOSIS — M545 Low back pain: Secondary | ICD-10-CM | POA: Diagnosis not present

## 2017-02-13 DIAGNOSIS — M6281 Muscle weakness (generalized): Secondary | ICD-10-CM | POA: Diagnosis not present

## 2017-02-13 DIAGNOSIS — M546 Pain in thoracic spine: Secondary | ICD-10-CM | POA: Diagnosis not present

## 2017-02-13 DIAGNOSIS — R293 Abnormal posture: Secondary | ICD-10-CM | POA: Diagnosis not present

## 2017-02-18 DIAGNOSIS — M25551 Pain in right hip: Secondary | ICD-10-CM | POA: Diagnosis not present

## 2017-02-18 DIAGNOSIS — M546 Pain in thoracic spine: Secondary | ICD-10-CM | POA: Diagnosis not present

## 2017-02-18 DIAGNOSIS — M6281 Muscle weakness (generalized): Secondary | ICD-10-CM | POA: Diagnosis not present

## 2017-02-18 DIAGNOSIS — R293 Abnormal posture: Secondary | ICD-10-CM | POA: Diagnosis not present

## 2017-02-18 DIAGNOSIS — M545 Low back pain: Secondary | ICD-10-CM | POA: Diagnosis not present

## 2017-02-20 DIAGNOSIS — M25551 Pain in right hip: Secondary | ICD-10-CM | POA: Diagnosis not present

## 2017-02-20 DIAGNOSIS — M6281 Muscle weakness (generalized): Secondary | ICD-10-CM | POA: Diagnosis not present

## 2017-02-20 DIAGNOSIS — M545 Low back pain: Secondary | ICD-10-CM | POA: Diagnosis not present

## 2017-02-20 DIAGNOSIS — R293 Abnormal posture: Secondary | ICD-10-CM | POA: Diagnosis not present

## 2017-02-20 DIAGNOSIS — M546 Pain in thoracic spine: Secondary | ICD-10-CM | POA: Diagnosis not present

## 2017-02-25 DIAGNOSIS — R293 Abnormal posture: Secondary | ICD-10-CM | POA: Diagnosis not present

## 2017-02-25 DIAGNOSIS — M546 Pain in thoracic spine: Secondary | ICD-10-CM | POA: Diagnosis not present

## 2017-02-25 DIAGNOSIS — M25551 Pain in right hip: Secondary | ICD-10-CM | POA: Diagnosis not present

## 2017-02-25 DIAGNOSIS — M545 Low back pain: Secondary | ICD-10-CM | POA: Diagnosis not present

## 2017-02-25 DIAGNOSIS — M6281 Muscle weakness (generalized): Secondary | ICD-10-CM | POA: Diagnosis not present

## 2017-02-27 DIAGNOSIS — M545 Low back pain: Secondary | ICD-10-CM | POA: Diagnosis not present

## 2017-02-27 DIAGNOSIS — M6281 Muscle weakness (generalized): Secondary | ICD-10-CM | POA: Diagnosis not present

## 2017-02-27 DIAGNOSIS — M25551 Pain in right hip: Secondary | ICD-10-CM | POA: Diagnosis not present

## 2017-02-27 DIAGNOSIS — M546 Pain in thoracic spine: Secondary | ICD-10-CM | POA: Diagnosis not present

## 2017-02-27 DIAGNOSIS — R293 Abnormal posture: Secondary | ICD-10-CM | POA: Diagnosis not present

## 2017-03-03 DIAGNOSIS — I10 Essential (primary) hypertension: Secondary | ICD-10-CM | POA: Diagnosis not present

## 2017-03-03 DIAGNOSIS — N183 Chronic kidney disease, stage 3 (moderate): Secondary | ICD-10-CM | POA: Diagnosis not present

## 2017-03-03 DIAGNOSIS — N390 Urinary tract infection, site not specified: Secondary | ICD-10-CM | POA: Diagnosis not present

## 2017-03-03 DIAGNOSIS — M545 Low back pain: Secondary | ICD-10-CM | POA: Diagnosis not present

## 2017-03-04 ENCOUNTER — Telehealth: Payer: Self-pay

## 2017-03-04 DIAGNOSIS — N183 Chronic kidney disease, stage 3 (moderate): Secondary | ICD-10-CM | POA: Diagnosis not present

## 2017-03-04 NOTE — Telephone Encounter (Signed)
Patient was taken off irbesartan and put on amlodipine 5 mg by [facility]. And was told to call here for pain medicine for her back. I informed patient we do not prescribe pain medication and she verbalized understanding. Patient also asking for me to mail her medication list to her. Printing list and mailing to her.

## 2017-03-06 DIAGNOSIS — R293 Abnormal posture: Secondary | ICD-10-CM | POA: Diagnosis not present

## 2017-03-06 DIAGNOSIS — M25551 Pain in right hip: Secondary | ICD-10-CM | POA: Diagnosis not present

## 2017-03-06 DIAGNOSIS — M6281 Muscle weakness (generalized): Secondary | ICD-10-CM | POA: Diagnosis not present

## 2017-03-06 DIAGNOSIS — M546 Pain in thoracic spine: Secondary | ICD-10-CM | POA: Diagnosis not present

## 2017-03-06 DIAGNOSIS — M545 Low back pain: Secondary | ICD-10-CM | POA: Diagnosis not present

## 2017-03-13 DIAGNOSIS — M6281 Muscle weakness (generalized): Secondary | ICD-10-CM | POA: Diagnosis not present

## 2017-03-13 DIAGNOSIS — M25551 Pain in right hip: Secondary | ICD-10-CM | POA: Diagnosis not present

## 2017-03-13 DIAGNOSIS — N183 Chronic kidney disease, stage 3 (moderate): Secondary | ICD-10-CM | POA: Diagnosis not present

## 2017-03-13 DIAGNOSIS — M546 Pain in thoracic spine: Secondary | ICD-10-CM | POA: Diagnosis not present

## 2017-03-13 DIAGNOSIS — M545 Low back pain: Secondary | ICD-10-CM | POA: Diagnosis not present

## 2017-03-13 DIAGNOSIS — R293 Abnormal posture: Secondary | ICD-10-CM | POA: Diagnosis not present

## 2017-03-20 DIAGNOSIS — D3610 Benign neoplasm of peripheral nerves and autonomic nervous system, unspecified: Secondary | ICD-10-CM | POA: Diagnosis not present

## 2017-03-20 DIAGNOSIS — R1900 Intra-abdominal and pelvic swelling, mass and lump, unspecified site: Secondary | ICD-10-CM | POA: Diagnosis not present

## 2017-03-25 DIAGNOSIS — M6281 Muscle weakness (generalized): Secondary | ICD-10-CM | POA: Diagnosis not present

## 2017-03-25 DIAGNOSIS — M25551 Pain in right hip: Secondary | ICD-10-CM | POA: Diagnosis not present

## 2017-03-25 DIAGNOSIS — M545 Low back pain: Secondary | ICD-10-CM | POA: Diagnosis not present

## 2017-03-25 DIAGNOSIS — M546 Pain in thoracic spine: Secondary | ICD-10-CM | POA: Diagnosis not present

## 2017-03-25 DIAGNOSIS — R293 Abnormal posture: Secondary | ICD-10-CM | POA: Diagnosis not present

## 2017-03-27 DIAGNOSIS — M6281 Muscle weakness (generalized): Secondary | ICD-10-CM | POA: Diagnosis not present

## 2017-03-27 DIAGNOSIS — M545 Low back pain: Secondary | ICD-10-CM | POA: Diagnosis not present

## 2017-03-27 DIAGNOSIS — M546 Pain in thoracic spine: Secondary | ICD-10-CM | POA: Diagnosis not present

## 2017-03-27 DIAGNOSIS — R293 Abnormal posture: Secondary | ICD-10-CM | POA: Diagnosis not present

## 2017-03-27 DIAGNOSIS — M25551 Pain in right hip: Secondary | ICD-10-CM | POA: Diagnosis not present

## 2017-03-28 DIAGNOSIS — M545 Low back pain: Secondary | ICD-10-CM | POA: Diagnosis not present

## 2017-03-28 DIAGNOSIS — D3610 Benign neoplasm of peripheral nerves and autonomic nervous system, unspecified: Secondary | ICD-10-CM | POA: Diagnosis not present

## 2017-03-28 DIAGNOSIS — M5416 Radiculopathy, lumbar region: Secondary | ICD-10-CM | POA: Diagnosis not present

## 2017-04-01 DIAGNOSIS — M25551 Pain in right hip: Secondary | ICD-10-CM | POA: Diagnosis not present

## 2017-04-01 DIAGNOSIS — M545 Low back pain: Secondary | ICD-10-CM | POA: Diagnosis not present

## 2017-04-01 DIAGNOSIS — R293 Abnormal posture: Secondary | ICD-10-CM | POA: Diagnosis not present

## 2017-04-01 DIAGNOSIS — M6281 Muscle weakness (generalized): Secondary | ICD-10-CM | POA: Diagnosis not present

## 2017-04-01 DIAGNOSIS — M546 Pain in thoracic spine: Secondary | ICD-10-CM | POA: Diagnosis not present

## 2017-04-03 DIAGNOSIS — R293 Abnormal posture: Secondary | ICD-10-CM | POA: Diagnosis not present

## 2017-04-03 DIAGNOSIS — M6281 Muscle weakness (generalized): Secondary | ICD-10-CM | POA: Diagnosis not present

## 2017-04-03 DIAGNOSIS — M546 Pain in thoracic spine: Secondary | ICD-10-CM | POA: Diagnosis not present

## 2017-04-03 DIAGNOSIS — M545 Low back pain: Secondary | ICD-10-CM | POA: Diagnosis not present

## 2017-04-03 DIAGNOSIS — M25551 Pain in right hip: Secondary | ICD-10-CM | POA: Diagnosis not present

## 2017-04-05 DIAGNOSIS — R040 Epistaxis: Secondary | ICD-10-CM | POA: Diagnosis not present

## 2017-04-07 DIAGNOSIS — R040 Epistaxis: Secondary | ICD-10-CM | POA: Diagnosis not present

## 2017-04-07 DIAGNOSIS — Z87891 Personal history of nicotine dependence: Secondary | ICD-10-CM | POA: Diagnosis not present

## 2017-04-09 DIAGNOSIS — R040 Epistaxis: Secondary | ICD-10-CM | POA: Diagnosis not present

## 2017-04-09 DIAGNOSIS — M47816 Spondylosis without myelopathy or radiculopathy, lumbar region: Secondary | ICD-10-CM | POA: Diagnosis not present

## 2017-04-09 DIAGNOSIS — M5136 Other intervertebral disc degeneration, lumbar region: Secondary | ICD-10-CM | POA: Diagnosis not present

## 2017-04-11 DIAGNOSIS — N183 Chronic kidney disease, stage 3 (moderate): Secondary | ICD-10-CM | POA: Diagnosis not present

## 2017-04-14 DIAGNOSIS — M47816 Spondylosis without myelopathy or radiculopathy, lumbar region: Secondary | ICD-10-CM | POA: Diagnosis not present

## 2017-04-14 DIAGNOSIS — M5136 Other intervertebral disc degeneration, lumbar region: Secondary | ICD-10-CM | POA: Diagnosis not present

## 2017-04-15 DIAGNOSIS — I10 Essential (primary) hypertension: Secondary | ICD-10-CM | POA: Diagnosis not present

## 2017-04-15 DIAGNOSIS — N183 Chronic kidney disease, stage 3 (moderate): Secondary | ICD-10-CM | POA: Diagnosis not present

## 2017-04-17 DIAGNOSIS — R293 Abnormal posture: Secondary | ICD-10-CM | POA: Diagnosis not present

## 2017-04-17 DIAGNOSIS — M6281 Muscle weakness (generalized): Secondary | ICD-10-CM | POA: Diagnosis not present

## 2017-04-17 DIAGNOSIS — M545 Low back pain: Secondary | ICD-10-CM | POA: Diagnosis not present

## 2017-04-17 DIAGNOSIS — M546 Pain in thoracic spine: Secondary | ICD-10-CM | POA: Diagnosis not present

## 2017-04-17 DIAGNOSIS — M25551 Pain in right hip: Secondary | ICD-10-CM | POA: Diagnosis not present

## 2017-04-22 ENCOUNTER — Ambulatory Visit (INDEPENDENT_AMBULATORY_CARE_PROVIDER_SITE_OTHER): Payer: Medicare Other | Admitting: Internal Medicine

## 2017-04-22 ENCOUNTER — Ambulatory Visit
Admission: RE | Admit: 2017-04-22 | Discharge: 2017-04-22 | Disposition: A | Discharge status: Home / Self Care | Payer: Medicare Other | Source: Ambulatory Visit | Attending: Internal Medicine | Admitting: Internal Medicine

## 2017-04-22 ENCOUNTER — Encounter: Payer: Self-pay | Admitting: Internal Medicine

## 2017-04-22 VITALS — BP 132/64 | HR 87 | Ht 63.0 in | Wt 153.0 lb

## 2017-04-22 DIAGNOSIS — G939 Disorder of brain, unspecified: Secondary | ICD-10-CM | POA: Diagnosis not present

## 2017-04-22 DIAGNOSIS — E034 Atrophy of thyroid (acquired): Secondary | ICD-10-CM

## 2017-04-22 DIAGNOSIS — I6782 Cerebral ischemia: Secondary | ICD-10-CM

## 2017-04-22 DIAGNOSIS — M25551 Pain in right hip: Secondary | ICD-10-CM

## 2017-04-22 DIAGNOSIS — M160 Bilateral primary osteoarthritis of hip: Secondary | ICD-10-CM | POA: Diagnosis not present

## 2017-04-22 DIAGNOSIS — I10 Essential (primary) hypertension: Secondary | ICD-10-CM | POA: Diagnosis not present

## 2017-04-22 DIAGNOSIS — R739 Hyperglycemia, unspecified: Secondary | ICD-10-CM | POA: Diagnosis not present

## 2017-04-22 DIAGNOSIS — M47816 Spondylosis without myelopathy or radiculopathy, lumbar region: Secondary | ICD-10-CM | POA: Insufficient documentation

## 2017-04-22 DIAGNOSIS — E785 Hyperlipidemia, unspecified: Secondary | ICD-10-CM | POA: Diagnosis not present

## 2017-04-22 DIAGNOSIS — M25552 Pain in left hip: Secondary | ICD-10-CM

## 2017-04-22 DIAGNOSIS — R358 Other polyuria: Secondary | ICD-10-CM | POA: Diagnosis not present

## 2017-04-22 DIAGNOSIS — R3589 Other polyuria: Secondary | ICD-10-CM

## 2017-04-22 DIAGNOSIS — Z23 Encounter for immunization: Secondary | ICD-10-CM | POA: Diagnosis not present

## 2017-04-22 NOTE — Progress Notes (Signed)
Date:  04/22/2017   Name:  Crystal Zavala   DOB:  23-May-1932   MRN:  [id number]   Chief Complaint: Hypothyroidism (Recheck thyroid.) and Hypertension  Hypertension  This is a chronic problem. The problem is controlled. Pertinent negatives include no chest pain, headaches, palpitations or shortness of breath. Past treatments include calcium channel blockers and diuretics. The current treatment provides significant improvement. Identifiable causes of hypertension include a thyroid problem.  Thyroid Problem  Presents for follow-up visit. Patient reports no constipation, diaphoresis, fatigue, leg swelling, palpitations, tremors or weight gain. The symptoms have been stable.  Hip Pain   There was no injury mechanism. The pain is present in the right hip and left hip. Quality: decreased ROM noted by PT  The pain is mild. Associated symptoms include a loss of motion. Pertinent negatives include no muscle weakness or numbness. Exacerbated by: mild lumbar scoliosis.  Back Pain  This is a chronic problem. Pertinent negatives include no abdominal pain, chest pain, dysuria, fever, headaches or numbness. Treatments tried: just received lumbar ablation - slowly improving.   Hx of Remote TIA - pt has been on Plavix for many years.  No record of the event can be located at this time.  She has not had any further similar sx but does have issues with nose bleeds - once requiring ED for packing.  She wonders if she can stop it.  Review of Systems  Constitutional: Positive for unexpected weight change. Negative for appetite change, diaphoresis, fatigue, fever and weight gain.  HENT: Negative for tinnitus and trouble swallowing.   Eyes: Negative for visual disturbance.  Respiratory: Negative for cough, chest tightness and shortness of breath.   Cardiovascular: Negative for chest pain, palpitations and leg swelling.  Gastrointestinal: Negative for abdominal pain and constipation.  Endocrine: Positive for  polydipsia and polyuria.  Genitourinary: Negative for dysuria and hematuria.  Musculoskeletal: Positive for arthralgias and back pain.  Skin: Negative for color change and rash.  Neurological: Negative for tremors, numbness and headaches.  Hematological: Bruises/bleeds easily.  Psychiatric/Behavioral: Negative for dysphoric mood and sleep disturbance.    Patient Active Problem List   Diagnosis Date Noted  . Renal insufficiency 01/05/2017  . 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 Spondylosis of lumbar region without myelopathy or radiculopathy 10/21/2016  . Hypothyroidism due to acquired atrophy of thyroid 04/22/2016  . Insomnia 04/22/2016  . DDD (degenerative disc disease), lumbar 04/22/2016  . Breast mass, right 01/10/2016  . Aortic atherosclerosis ([place]) 01/18/2015  . Arthritis of neck ([place]) 01/18/2015  . Dermatitis, eczematoid 01/18/2015  . Essential (primary) hypertension 01/18/2015  . Esophagitis, reflux 01/18/2015  . H/O adenomatous polyp of colon 01/18/2015  . Migraine without aura and responsive to treatment 01/18/2015  . Asthma, mild intermittent 01/18/2015  . Hyperlipidemia, mild 01/18/2015  . Lung nodule, multiple 01/18/2015  . Kidney cysts 01/18/2015  . Tobacco use disorder, moderate, in sustained remission 01/18/2015  . Temporary cerebral vascular dysfunction 01/18/2015    Prior to Admission medications   Medication Sig Start Date End Date Taking? Authorizing Provider  acetaminophen (TYLENOL) 500 MG tablet Take by mouth.   Yes [provider]  albuterol (PROAIR HFA) 108 (90 Base) MCG/ACT inhaler Inhale 2 puffs into the lungs 4 (four) times daily as needed. 09/12/15  Yes Glean Hess, MD  amLODipine (NORVASC) 10 MG tablet Take 10 mg by mouth daily.   Yes [provider]  clopidogrel (PLAVIX) 75 MG tablet Take  1 tablet (75 mg total) by mouth daily. 10/21/16  Yes Glean Hess, MD  diazepam (VALIUM) 10 MG tablet  TAKE 1 TABLET BY MOUTH NIGHTLY AS NEEDED 04/05/16  Yes Glean Hess, MD  Fluticasone-Salmeterol (ADVAIR DISKUS) 250-50 MCG/DOSE AEPB Inhale 1 puff into the lungs 2 (two) times daily. 09/12/15  Yes Glean Hess, MD  hydrochlorothiazide (MICROZIDE) 12.5 MG capsule Take 12.5 mg by mouth daily.   Yes [provider]  levothyroxine (SYNTHROID, LEVOTHROID) 112 MCG tablet Take 1 tablet (112 mcg total) by mouth daily before breakfast. 01/23/17  Yes Glean Hess, MD  pantoprazole (PROTONIX) 40 MG tablet Take 40 mg by mouth daily.   Yes [provider]  ranitidine (ZANTAC) 150 MG tablet Take 2 tablets (300 mg total) by mouth 2 (two) times daily. 09/12/15  Yes Glean Hess, MD    Allergies  Allergen Reactions  . Ace Inhibitors Cough    Past Surgical History:  Procedure Laterality Date  . ABDOMINAL HYSTERECTOMY    . BLEPHAROPLASTY Bilateral 2013  . BREAST BIOPSY Left 2009   benign  . COLONOSCOPY  2006   Normal  . ESI  01/2017   L5-S1 @ [facility]  . ESOPHAGOGASTRODUODENOSCOPY  2015   Dilation of esophageal stricture    Social History  Substance Use Topics  . Smoking status: Former Research scientist (life sciences)  . Smokeless tobacco: Never Used  . Alcohol use No     Medication list has been reviewed and updated.  PHQ 2/9 Scores 10/21/2016 09/12/2015  PHQ - 2 Score 0 0    Physical Exam  Constitutional: She is oriented to person, place, and time. She appears well-developed. No distress.  HENT:  Head: Normocephalic and atraumatic.  Neck: Normal range of motion. No thyromegaly present.  Cardiovascular: Normal rate, regular rhythm and normal heart sounds.   Pulmonary/Chest: Effort normal. No respiratory distress. She has no wheezes.  Musculoskeletal: She exhibits no edema.       Right hip: She exhibits decreased range of motion.       Left hip: She exhibits decreased range of motion.  Neurological: She is alert and oriented to person, place, and time.  Skin: Skin is warm and dry. No  rash noted.  Psychiatric: She has a normal mood and affect. Her behavior is normal. Thought content normal.  Nursing note and vitals reviewed.   BP 132/64   Pulse 87   Ht 5\' 3"  (1.6 m)   Wt 153 lb (69.4 kg)   SpO2 99%   BMI 27.10 kg/m   Assessment and Plan: 1. Essential (primary) hypertension controlled - Comprehensive metabolic panel  2. Temporary cerebral vascular dysfunction Remote May stop plavix   3. Hypothyroidism due to acquired atrophy of thyroid Check labs and advise on dose change - TSH  4. Hyperlipidemia, mild Aged out of need for primary prevention Take aspirin 81 mg daily  5. Polyuria Rule out early DM   6. Pain of both hip joints - DG HIP UNILAT WITH PELVIS 2-3 VIEWS LEFT; Future - DG HIP UNILAT WITH PELVIS 2-3 VIEWS RIGHT; Future  7. Need for influenza vaccination - Flu vaccine HIGH DOSE PF  8. Hyperglycemia Rule out DM - polyuria, polydipsia and 10 lb wt loss - Comprehensive metabolic panel - Hemoglobin A1c   No orders of the defined types were placed in this encounter.   Partially dictated using Editor, commissioning. Any errors are unintentional.  Halina Maidens, MD [facility]  04/22/2017

## 2017-04-22 NOTE — Patient Instructions (Signed)
Call pharmacy to hold off sending Levothyroxine until tests return  Will call you about xrays

## 2017-04-23 ENCOUNTER — Other Ambulatory Visit: Payer: Self-pay | Admitting: Internal Medicine

## 2017-04-23 ENCOUNTER — Telehealth: Payer: Self-pay

## 2017-04-23 DIAGNOSIS — E034 Atrophy of thyroid (acquired): Secondary | ICD-10-CM

## 2017-04-23 DIAGNOSIS — R040 Epistaxis: Secondary | ICD-10-CM | POA: Diagnosis not present

## 2017-04-23 LAB — COMPREHENSIVE METABOLIC PANEL
A/G RATIO: 1.9 (ref 1.2–2.2)
ALBUMIN: 4.4 g/dL (ref 3.5–4.7)
ALK PHOS: 105 IU/L (ref 39–117)
ALT: 15 IU/L (ref 0–32)
AST: 20 IU/L (ref 0–40)
BILIRUBIN TOTAL: 0.4 mg/dL (ref 0.0–1.2)
BUN / CREAT RATIO: 19 (ref 12–28)
BUN: 16 mg/dL (ref 8–27)
CHLORIDE: 103 mmol/L (ref 96–106)
CO2: 21 mmol/L (ref 20–29)
Calcium: 10.4 mg/dL — ABNORMAL HIGH (ref 8.7–10.3)
Creatinine, Ser: 0.84 mg/dL (ref 0.57–1.00)
GFR calc non Af Amer: 64 mL/min/{1.73_m2} (ref >59)
GFR, EST AFRICAN AMERICAN: 74 mL/min/{1.73_m2} (ref >59)
GLOBULIN, TOTAL: 2.3 g/dL (ref 1.5–4.5)
Glucose: 77 mg/dL (ref 65–99)
Potassium: 4.6 mmol/L (ref 3.5–5.2)
SODIUM: 143 mmol/L (ref 134–144)
TOTAL PROTEIN: 6.7 g/dL (ref 6.0–8.5)

## 2017-04-23 LAB — HEMOGLOBIN A1C
Est. average glucose Bld gHb Est-mCnc: 103 mg/dL
HEMOGLOBIN A1C: 5.2 % (ref 4.8–5.6)

## 2017-04-23 LAB — TSH: TSH: 0.04 u[IU]/mL — ABNORMAL LOW (ref 0.450–4.500)

## 2017-04-23 MED ORDER — LEVOTHYROXINE SODIUM 88 MCG PO TABS: 88.0000 ug | ORAL_TABLET | Freq: Every day | ORAL | 3 refills | Status: DC

## 2017-04-23 NOTE — Telephone Encounter (Signed)
Spoke to patient about Hip Xray and recent labs. Told to begin new rx fro thyroid when received in mail order. Also told to inform PT of degenerative joint disease in both hips but more right. She understood.

## 2017-04-24 DIAGNOSIS — M6281 Muscle weakness (generalized): Secondary | ICD-10-CM | POA: Diagnosis not present

## 2017-04-24 DIAGNOSIS — R293 Abnormal posture: Secondary | ICD-10-CM | POA: Diagnosis not present

## 2017-04-24 DIAGNOSIS — M546 Pain in thoracic spine: Secondary | ICD-10-CM | POA: Diagnosis not present

## 2017-04-24 DIAGNOSIS — M545 Low back pain: Secondary | ICD-10-CM | POA: Diagnosis not present

## 2017-04-24 DIAGNOSIS — M25551 Pain in right hip: Secondary | ICD-10-CM | POA: Diagnosis not present

## 2017-04-29 DIAGNOSIS — R293 Abnormal posture: Secondary | ICD-10-CM | POA: Diagnosis not present

## 2017-04-29 DIAGNOSIS — M546 Pain in thoracic spine: Secondary | ICD-10-CM | POA: Diagnosis not present

## 2017-04-29 DIAGNOSIS — M545 Low back pain: Secondary | ICD-10-CM | POA: Diagnosis not present

## 2017-04-29 DIAGNOSIS — M25551 Pain in right hip: Secondary | ICD-10-CM | POA: Diagnosis not present

## 2017-04-29 DIAGNOSIS — M6281 Muscle weakness (generalized): Secondary | ICD-10-CM | POA: Diagnosis not present

## 2017-05-01 DIAGNOSIS — M546 Pain in thoracic spine: Secondary | ICD-10-CM | POA: Diagnosis not present

## 2017-05-01 DIAGNOSIS — M545 Low back pain: Secondary | ICD-10-CM | POA: Diagnosis not present

## 2017-05-01 DIAGNOSIS — M25551 Pain in right hip: Secondary | ICD-10-CM | POA: Diagnosis not present

## 2017-05-01 DIAGNOSIS — M6281 Muscle weakness (generalized): Secondary | ICD-10-CM | POA: Diagnosis not present

## 2017-05-01 DIAGNOSIS — R293 Abnormal posture: Secondary | ICD-10-CM | POA: Diagnosis not present

## 2017-05-05 DIAGNOSIS — J42 Unspecified chronic bronchitis: Secondary | ICD-10-CM | POA: Diagnosis not present

## 2017-05-05 DIAGNOSIS — J4530 Mild persistent asthma, uncomplicated: Secondary | ICD-10-CM | POA: Diagnosis not present

## 2017-05-06 DIAGNOSIS — M25551 Pain in right hip: Secondary | ICD-10-CM | POA: Diagnosis not present

## 2017-05-06 DIAGNOSIS — M546 Pain in thoracic spine: Secondary | ICD-10-CM | POA: Diagnosis not present

## 2017-05-06 DIAGNOSIS — R293 Abnormal posture: Secondary | ICD-10-CM | POA: Diagnosis not present

## 2017-05-06 DIAGNOSIS — M6281 Muscle weakness (generalized): Secondary | ICD-10-CM | POA: Diagnosis not present

## 2017-05-06 DIAGNOSIS — M545 Low back pain: Secondary | ICD-10-CM | POA: Diagnosis not present

## 2017-05-13 DIAGNOSIS — R1310 Dysphagia, unspecified: Secondary | ICD-10-CM | POA: Diagnosis not present

## 2017-05-13 DIAGNOSIS — K219 Gastro-esophageal reflux disease without esophagitis: Secondary | ICD-10-CM | POA: Diagnosis not present

## 2017-05-19 DIAGNOSIS — L570 Actinic keratosis: Secondary | ICD-10-CM | POA: Diagnosis not present

## 2017-05-19 DIAGNOSIS — I781 Nevus, non-neoplastic: Secondary | ICD-10-CM | POA: Diagnosis not present

## 2017-05-19 DIAGNOSIS — L820 Inflamed seborrheic keratosis: Secondary | ICD-10-CM | POA: Diagnosis not present

## 2017-05-19 DIAGNOSIS — I788 Other diseases of capillaries: Secondary | ICD-10-CM | POA: Diagnosis not present

## 2017-06-02 DIAGNOSIS — M25551 Pain in right hip: Secondary | ICD-10-CM | POA: Diagnosis not present

## 2017-06-02 DIAGNOSIS — M5136 Other intervertebral disc degeneration, lumbar region: Secondary | ICD-10-CM | POA: Diagnosis not present

## 2017-06-02 DIAGNOSIS — M461 Sacroiliitis, not elsewhere classified: Secondary | ICD-10-CM | POA: Diagnosis not present

## 2017-06-02 DIAGNOSIS — M47816 Spondylosis without myelopathy or radiculopathy, lumbar region: Secondary | ICD-10-CM | POA: Diagnosis not present

## 2017-06-16 DIAGNOSIS — M25551 Pain in right hip: Secondary | ICD-10-CM | POA: Diagnosis not present

## 2017-06-16 DIAGNOSIS — M1611 Unilateral primary osteoarthritis, right hip: Secondary | ICD-10-CM | POA: Diagnosis not present

## 2017-06-17 DIAGNOSIS — J984 Other disorders of lung: Secondary | ICD-10-CM | POA: Diagnosis not present

## 2017-06-17 DIAGNOSIS — R918 Other nonspecific abnormal finding of lung field: Secondary | ICD-10-CM | POA: Diagnosis not present

## 2017-07-17 ENCOUNTER — Other Ambulatory Visit: Payer: Self-pay | Admitting: Internal Medicine

## 2017-08-05 HISTORY — PX: TOTAL HIP ARTHROPLASTY: SHX124

## 2017-09-08 DIAGNOSIS — M5416 Radiculopathy, lumbar region: Secondary | ICD-10-CM | POA: Diagnosis not present

## 2017-09-08 DIAGNOSIS — M25551 Pain in right hip: Secondary | ICD-10-CM | POA: Diagnosis not present

## 2017-09-08 DIAGNOSIS — M5136 Other intervertebral disc degeneration, lumbar region: Secondary | ICD-10-CM | POA: Diagnosis not present

## 2017-09-12 DIAGNOSIS — D3610 Benign neoplasm of peripheral nerves and autonomic nervous system, unspecified: Secondary | ICD-10-CM | POA: Diagnosis not present

## 2017-10-01 ENCOUNTER — Telehealth: Payer: Self-pay

## 2017-10-01 NOTE — Telephone Encounter (Signed)
Called pt to sched AWV w/ [facility]. Unable to reach pt d/t no VM.

## 2017-10-06 DIAGNOSIS — M25552 Pain in left hip: Secondary | ICD-10-CM | POA: Diagnosis not present

## 2017-10-08 ENCOUNTER — Telehealth: Payer: Self-pay | Admitting: Internal Medicine

## 2017-10-08 NOTE — Telephone Encounter (Signed)
Called to schedule AWV with Nurse Health Advisor. Last AWV on 3/19/18please schedule AWV with NHA any date after 3/19 Jill Alexanders [phone]  Skype [email or web address]

## 2017-10-20 ENCOUNTER — Ambulatory Visit: Payer: Medicare Other

## 2017-10-22 ENCOUNTER — Ambulatory Visit: Payer: Medicare Other

## 2017-10-22 ENCOUNTER — Ambulatory Visit (INDEPENDENT_AMBULATORY_CARE_PROVIDER_SITE_OTHER): Payer: Medicare Other | Admitting: Internal Medicine

## 2017-10-22 ENCOUNTER — Encounter: Payer: Self-pay | Admitting: Internal Medicine

## 2017-10-22 VITALS — BP 130/82 | HR 73 | Ht 63.0 in | Wt 163.0 lb

## 2017-10-22 DIAGNOSIS — I10 Essential (primary) hypertension: Secondary | ICD-10-CM | POA: Diagnosis not present

## 2017-10-22 DIAGNOSIS — M5136 Other intervertebral disc degeneration, lumbar region: Secondary | ICD-10-CM

## 2017-10-22 DIAGNOSIS — E034 Atrophy of thyroid (acquired): Secondary | ICD-10-CM | POA: Diagnosis not present

## 2017-10-22 DIAGNOSIS — M51369 Other intervertebral disc degeneration, lumbar region without mention of lumbar back pain or lower extremity pain: Secondary | ICD-10-CM

## 2017-10-22 DIAGNOSIS — M47816 Spondylosis without myelopathy or radiculopathy, lumbar region: Secondary | ICD-10-CM | POA: Diagnosis not present

## 2017-10-22 MED ORDER — TRAMADOL HCL 50 MG PO TABS: 50.0000 mg | ORAL_TABLET | Freq: Two times a day (BID) | ORAL | 0 refills | Status: DC | PRN

## 2017-10-22 NOTE — Progress Notes (Signed)
Date:  10/22/2017   Name:  Crystal Zavala   DOB:  03-30-1932   MRN:  [id number]   Chief Complaint: Hypertension Hypertension  Pertinent negatives include no chest pain, headaches, palpitations or shortness of breath. Identifiable causes of hypertension include a thyroid problem.  Thyroid Problem  Presents for follow-up visit. Patient reports no fatigue, palpitations or tremors. The symptoms have been improving (dose decreased last visit).  Back Pain  This is a chronic problem. The problem occurs constantly. The problem is unchanged. The pain is present in the lumbar spine. The quality of the pain is described as aching and burning. The pain is moderate. The symptoms are aggravated by standing and twisting. Pertinent negatives include no abdominal pain, chest pain, dysuria, fever, headaches or numbness. She has tried chiropractic manipulation (ESI and nerve block from Neurosurgery - none helped. ) for the symptoms. The treatment provided no relief.      Review of Systems  Constitutional: Negative for appetite change, fatigue, fever and unexpected weight change.  HENT: Negative for tinnitus and trouble swallowing.   Eyes: Negative for visual disturbance.  Respiratory: Negative for cough, chest tightness and shortness of breath.   Cardiovascular: Negative for chest pain, palpitations and leg swelling.  Gastrointestinal: Negative for abdominal pain.  Endocrine: Negative for polydipsia.  Genitourinary: Negative for dysuria and hematuria.  Musculoskeletal: Positive for arthralgias and back pain.  Skin: Negative for rash.  Neurological: Negative for dizziness, tremors, numbness and headaches.  Psychiatric/Behavioral: Negative for dysphoric mood and sleep disturbance.    Patient Active Problem List   Diagnosis Date Noted  . Pain of both hip joints 04/22/2017  . Renal insufficiency 01/05/2017  . 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 Spondylosis of lumbar  region without myelopathy or radiculopathy 10/21/2016  . Hypothyroidism due to acquired atrophy of thyroid 04/22/2016  . Insomnia 04/22/2016  . DDD (degenerative disc disease), lumbar 04/22/2016  . Breast mass, right 01/10/2016  . Aortic atherosclerosis ([place]) 01/18/2015  . Arthritis of neck 01/18/2015  . Dermatitis, eczematoid 01/18/2015  . Essential (primary) hypertension 01/18/2015  . Esophagitis, reflux 01/18/2015  . H/O adenomatous polyp of colon 01/18/2015  . Migraine without aura and responsive to treatment 01/18/2015  . Asthma, mild intermittent 01/18/2015  . Hyperlipidemia, mild 01/18/2015  . Lung nodule, multiple 01/18/2015  . Kidney cysts 01/18/2015  . Tobacco use disorder, moderate, in sustained remission 01/18/2015  . Temporary cerebral vascular dysfunction 01/18/2015    Prior to Admission medications   Medication Sig Start Date End Date Taking? Authorizing Provider  acetaminophen (TYLENOL) 500 MG tablet Take by mouth.   Yes [provider]  albuterol (PROAIR HFA) 108 (90 Base) MCG/ACT inhaler Inhale 2 puffs into the lungs 4 (four) times daily as needed. 09/12/15  Yes Glean Hess, MD  amLODipine (NORVASC) 10 MG tablet Take 10 mg by mouth daily.   Yes [provider]  diazepam (VALIUM) 10 MG tablet TAKE 1 TABLET BY MOUTH EVERY NIGHT AT BEDTIME AS NEEDED 07/18/17  Yes Glean Hess, MD  Fluticasone-Salmeterol (ADVAIR DISKUS) 250-50 MCG/DOSE AEPB Inhale 1 puff into the lungs 2 (two) times daily. 09/12/15  Yes Glean Hess, MD  levothyroxine (SYNTHROID, LEVOTHROID) 88 MCG tablet Take 1 tablet (88 mcg total) by mouth daily before breakfast. 04/23/17  Yes Glean Hess, MD  pantoprazole (PROTONIX) 40 MG tablet Take 40 mg by mouth daily.   Yes [provider]  hydrochlorothiazide (MICROZIDE) 12.5 MG capsule  Take 12.5 mg by mouth daily.    [provider]    Allergies  Allergen Reactions  . Ace Inhibitors Cough    Past  Surgical History:  Procedure Laterality Date  . ABDOMINAL HYSTERECTOMY    . BLEPHAROPLASTY Bilateral 2013  . BREAST BIOPSY Left 2009   benign  . COLONOSCOPY  2006   Normal  . ESI  01/2017   L5-S1 @ [facility]  . ESOPHAGOGASTRODUODENOSCOPY  2015   Dilation of esophageal stricture    Social History   Tobacco Use  . Smoking status: Former Research scientist (life sciences)  . Smokeless tobacco: Never Used  Substance Use Topics  . Alcohol use: No    Alcohol/week: 0.0 oz  . Drug use: No     Medication list has been reviewed and updated.  PHQ 2/9 Scores 10/22/2017 10/21/2016 09/12/2015  PHQ - 2 Score 0 0 0    Physical Exam  Constitutional: She is oriented to person, place, and time. She appears well-developed. No distress.  HENT:  Head: Normocephalic and atraumatic.  Neck: Normal range of motion. No thyromegaly present.  Cardiovascular: Normal rate and normal heart sounds.  Pulmonary/Chest: Effort normal and breath sounds normal. No respiratory distress. She has no wheezes.  Musculoskeletal: She exhibits no edema.       Lumbar back: She exhibits bony tenderness.  Neurological: She is alert and oriented to person, place, and time.  Skin: Skin is warm and dry. No rash noted.  Psychiatric: She has a normal mood and affect. Her behavior is normal. Thought content normal.  Nursing note and vitals reviewed.   BP 130/82   Pulse 73   Ht 5\' 3"  (1.6 m)   Wt 163 lb (73.9 kg)   SpO2 99%   BMI 28.87 kg/m   Assessment and Plan: 1. Essential (primary) hypertension controlled - Basic metabolic panel  2. Hypothyroidism due to acquired atrophy of thyroid Check labs on new dose - TSH  3. Spondylosis of lumbar region without myelopathy or radiculopathy - traMADol (ULTRAM) 50 MG tablet; Take 1 tablet (50 mg total) by mouth every 12 (twelve) hours as needed.  Dispense: 60 tablet; Refill: 0  4. DDD (degenerative disc disease), lumbar Take tylenol with tramadol for additive effect Can send tramadol Rx to mail order  if helpful - traMADol (ULTRAM) 50 MG tablet; Take 1 tablet (50 mg total) by mouth every 12 (twelve) hours as needed.  Dispense: 60 tablet; Refill: 0   Meds ordered this encounter  Medications  . traMADol (ULTRAM) 50 MG tablet    Sig: Take 1 tablet (50 mg total) by mouth every 12 (twelve) hours as needed.    Dispense:  60 tablet    Refill:  0    Partially dictated using Editor, commissioning. Any errors are unintentional.  Halina Maidens, MD [facility]  10/22/2017

## 2017-10-23 ENCOUNTER — Encounter: Payer: Self-pay | Admitting: Internal Medicine

## 2017-10-23 ENCOUNTER — Ambulatory Visit (INDEPENDENT_AMBULATORY_CARE_PROVIDER_SITE_OTHER): Payer: Medicare Other

## 2017-10-23 VITALS — BP 138/70 | HR 70 | Temp 97.6°F | Resp 12 | Ht 63.0 in | Wt 164.4 lb

## 2017-10-23 DIAGNOSIS — Z0000 Encounter for general adult medical examination without abnormal findings: Secondary | ICD-10-CM | POA: Diagnosis not present

## 2017-10-23 LAB — BASIC METABOLIC PANEL
BUN/Creatinine Ratio: 16 (ref 12–28)
BUN: 17 mg/dL (ref 8–27)
CALCIUM: 10 mg/dL (ref 8.7–10.3)
CHLORIDE: 102 mmol/L (ref 96–106)
CO2: 24 mmol/L (ref 20–29)
Creatinine, Ser: 1.04 mg/dL — ABNORMAL HIGH (ref 0.57–1.00)
GFR calc Af Amer: 57 mL/min/{1.73_m2} — ABNORMAL LOW (ref >59)
GFR calc non Af Amer: 49 mL/min/{1.73_m2} — ABNORMAL LOW (ref >59)
Glucose: 81 mg/dL (ref 65–99)
POTASSIUM: 5 mmol/L (ref 3.5–5.2)
Sodium: 141 mmol/L (ref 134–144)

## 2017-10-23 LAB — TSH: TSH: 2.92 u[IU]/mL (ref 0.450–4.500)

## 2017-10-23 NOTE — Progress Notes (Signed)
Subjective:   Crystal Zavala is a 84 y.o. female who presents for Medicare Annual (Subsequent) preventive examination.  Review of Systems:  N/A Cardiac Risk Factors include: advanced age (>73men, >42 women);dyslipidemia;hypertension;sedentary lifestyle     Objective:     Vitals: BP 138/70 (BP Location: Right Arm, Patient Position: Sitting, Cuff Size: Large)   Pulse 70   Temp 97.6 F (36.4 C) (Oral)   Resp 12   Ht 5\' 3"  (1.6 m)   Wt 164 lb 6.4 oz (74.6 kg)   SpO2 95%   BMI 29.12 kg/m   Body mass index is 29.12 kg/m.  Advanced Directives 10/23/2017 10/21/2016 09/12/2015  Does Patient Have a Medical Advance Directive? Yes Yes No  Type of Paramedic of [place];Living will [place];Living will -  Copy of [place] in Chart? No - copy requested No - copy requested -  Would patient like information on creating a medical advance directive? - - No - patient declined information    Tobacco Social History   Tobacco Use  Smoking Status Former Smoker  . Packs/day: 0.50  . Years: 39.00  . Pack years: 19.50  . Types: Cigarettes  . Last attempt to quit: 1992  . Years since quitting: 27.2  Smokeless Tobacco Never Used  Tobacco Comment   smoking cessation materails not required     Counseling given: No Comment: smoking cessation materails not required   Clinical Intake:  Pre-visit preparation completed: Yes  Pain : No/denies pain   BMI - recorded: 28.87 Nutritional Status: BMI 25 -29 Overweight Nutritional Risks: None Diabetes: No  How often do you need to have someone help you when you read instructions, pamphlets, or other written materials from your doctor or pharmacy?: 1 - Never  Interpreter Needed?: No  Information entered by :: AEversole, LPN  Past Medical History:  Diagnosis Date  . Hyperlipidemia   . Hypertension   . Osteoarthritis    in back  . Scoliosis   . Spondylosis    Past Surgical  History:  Procedure Laterality Date  . ABDOMINAL HYSTERECTOMY    . BLEPHAROPLASTY Bilateral 2013  . BREAST BIOPSY Left 2009   benign  . COLONOSCOPY  2006   Normal  . ESI  01/2017   L5-S1 @ [facility]  . ESOPHAGOGASTRODUODENOSCOPY  2015   Dilation of esophageal stricture   Family History  Problem Relation Age of Onset  . Tuberculosis Mother   . Stroke Father   . Bipolar disorder Daughter   . Alzheimer's disease Brother   . Heart attack Brother   . Bipolar disorder Sister   . Diabetes Son   . Heart attack Brother   . Heart attack Brother    Social History   Socioeconomic History  . Marital status: Widowed    Spouse name: Not on file  . Number of children: 2  . Years of education: Not on file  . Highest education level: 12th grade  Occupational History  . Occupation: Retired  Scientific laboratory technician  . Financial resource strain: Not hard at all  . Food insecurity:    Worry: Never true    Inability: Never true  . Transportation needs:    Medical: No    Non-medical: No  Tobacco Use  . Smoking status: Former Smoker    Packs/day: 0.50    Years: 39.00    Pack years: 19.50    Types: Cigarettes    Last attempt to quit: 1992  Years since quitting: 27.2  . Smokeless tobacco: Never Used  . Tobacco comment: smoking cessation materails not required  Substance and Sexual Activity  . Alcohol use: No    Alcohol/week: 0.0 oz  . Drug use: No  . Sexual activity: Never  Lifestyle  . Physical activity:    Days per week: 0 days    Minutes per session: 0 min  . Stress: Not at all  Relationships  . Social connections:    Talks on phone: Patient refused    Gets together: Patient refused    Attends religious service: Patient refused    Active member of club or organization: Patient refused    Attends meetings of clubs or organizations: Patient refused    Relationship status: Widowed  Other Topics Concern  . Not on file  Social History Narrative  . Not on file    Outpatient Encounter  Medications as of 10/23/2017  Medication Sig  . acetaminophen (TYLENOL) 500 MG tablet Take 500 mg by mouth every 6 (six) hours as needed.   Marland Kitchen albuterol (PROAIR HFA) 108 (90 Base) MCG/ACT inhaler Inhale 2 puffs into the lungs 4 (four) times daily as needed.  Marland Kitchen amLODipine (NORVASC) 10 MG tablet Take 10 mg by mouth daily.  . diazepam (VALIUM) 10 MG tablet TAKE 1 TABLET BY MOUTH EVERY NIGHT AT BEDTIME AS NEEDED  . Fluticasone-Salmeterol (ADVAIR DISKUS) 250-50 MCG/DOSE AEPB Inhale 1 puff into the lungs 2 (two) times daily.  . hydrochlorothiazide (MICROZIDE) 12.5 MG capsule Take 12.5 mg by mouth daily as needed.   Marland Kitchen levothyroxine (SYNTHROID, LEVOTHROID) 88 MCG tablet Take 1 tablet (88 mcg total) by mouth daily before breakfast.  . pantoprazole (PROTONIX) 40 MG tablet Take 40 mg by mouth daily.  . traMADol (ULTRAM) 50 MG tablet Take 1 tablet (50 mg total) by mouth every 12 (twelve) hours as needed.   No facility-administered encounter medications on file as of 10/23/2017.     Activities of Daily Living In your present state of health, do you have any difficulty performing the following activities: 10/23/2017 10/22/2017  Hearing? N N  Comment denies hearing aids -  Vision? N N  Comment denies eyeglasses -  Difficulty concentrating or making decisions? N N  Walking or climbing stairs? Y N  Comment hip pain -  Dressing or bathing? N N  Doing errands, shopping? N N  Preparing Food and eating ? N -  Comment partial upper and lower dentures -  Using the Toilet? N -  In the past six months, have you accidently leaked urine? Y -  Comment waers pads, stress incontinence -  Do you have problems with loss of bowel control? N -  Managing your Medications? N -  Managing your Finances? N -  Housekeeping or managing your Housekeeping? N -  Some recent data might be hidden    Patient Care Team: Glean Hess, MD as PCP - General (Internal Medicine) Tyler Pita, MD as Referring Physician  (Physical Medicine and Rehabilitation) Bayard Hugger, MD as Consulting Physician (Neurosurgery) Loann Quill, MD as Referring Physician (Gastroenterology)    Assessment:   This is a routine wellness examination for Crystal Zavala.  Exercise Activities and Dietary recommendations Current Exercise Habits: The patient does not participate in regular exercise at present, Exercise limited by: orthopedic condition(s)(hip pain, recently started Tramadol)  Goals    . DIET - INCREASE WATER INTAKE     Recommend to drink at least 6-8 8oz glasses of water per day.  Fall Risk Fall Risk  10/23/2017 10/22/2017 10/21/2016 09/12/2015  Falls in the past year? No No No No  Risk for fall due to : History of fall(s) - - -  Risk for fall due to: Comment trips over dog - - -   Is the patient's home free of loose throw rugs in walkways, pet beds, electrical cords, etc?   Yes Does the patient have any grab bars in the bathroom? No  Does the patient use a shower chair when bathing? No Does the patient have any stairs in or around the home? Yes If so, are there any handrails?  Yes Does the patient have adequate lighting?  Yes Does the patient use a cane, walker or w/c? Yes, use of cane Does the patient use of an elevated toilet seat? Yes  Timed Get Up and Go Performed: Yes. Pt ambulated 10 feet within 20 sec. Gait slow, steady and without the use of an assistive device. No intervention required at this time. Fall risk prevention has been discussed.  Pt declined my offer to send Community Resource Referral to Care Guide for  installation of grab bars in the shower or a shower chair.  Depression Screen PHQ 2/9 Scores 10/23/2017 10/22/2017 10/21/2016 09/12/2015  PHQ - 2 Score 0 0 0 0  PHQ- 9 Score 0 - - -     Cognitive Function     6CIT Screen 10/23/2017 10/21/2016  What Year? 0 points 0 points  What month? 0 points 0 points  What time? 0 points 0 points  Count back from 20 0 points 0 points  Months in  reverse 0 points 0 points  Repeat phrase 0 points 2 points  Total Score 0 2    Immunization History  Administered Date(s) Administered  . Influenza, High Dose Seasonal PF 04/22/2017  . Influenza,inj,Quad PF,6+ Mos 04/22/2016  . Influenza-Unspecified 06/06/2015  . Pneumococcal Conjugate-13 09/13/2014  . Pneumococcal Polysaccharide-23 10/03/2004  . Tdap 08/27/2011    Qualifies for Shingles Vaccine? Yes. Due for Zostavax or Shingrix vaccine. Education has been provided regarding the importance of this vaccine. Pt has been advised to call her insurance company to determine her out of pocket expense. Advised she may also receive this vaccine at her local pharmacy or Health Dept. Verbalized acceptance and understanding.  Screening Tests Health Maintenance  Topic Date Due  . MAMMOGRAM  01/06/2018  . TETANUS/TDAP  08/26/2021  . INFLUENZA VACCINE  Completed  . DEXA SCAN  Completed  . PNA vac Low Risk Adult  Completed    Cancer Screenings: Lung: Low Dose CT Chest recommended if Age 59-80 years, 30 pack-year currently smoking OR have quit w/in 15years. Patient does not qualify. Breast Screening: Completed 01/06/17. Repeat every year. Bone Density/Dexa: No longer required Colorectal: No longer required  Additional Screenings: Hepatitis B/HIV/Syphillis: Does not qualify Hepatitis C Screening: Does not qualify     Plan:  I have personally reviewed and addressed the Medicare Annual Wellness questionnaire and have noted the following in the patient's chart:  A. Medical and social history B. Use of alcohol, tobacco or illicit drugs  C. Current medications and supplements D. Functional ability and status E.  Nutritional status F.  Physical activity G. Advance directives H. List of other physicians I.  Hospitalizations, surgeries, and ER visits in previous 12 months J.  [place] such as hearing and vision if needed, cognitive and depression L. Referrals and appointments -  none  In addition, I have reviewed and discussed  with patient certain preventive protocols, quality metrics, and best practice recommendations. A written personalized care plan for preventive services as well as general preventive health recommendations were provided to patient.  Signed,  Aleatha Borer, LPN Nurse Health Advisor  MD Recommendations: Due for Zostavax or Shingrix vaccine. Education has been provided regarding the importance of this vaccine. Pt has been advised to call her insurance company to determine her out of pocket expense. Advised she may also receive this vaccine at her local pharmacy or Health Dept. Verbalized acceptance and understanding.  Will be due for mammogram on or after 01/06/18

## 2017-10-23 NOTE — Patient Instructions (Signed)
Crystal Zavala , Thank you for taking time to come for your Medicare Wellness Visit. I appreciate your ongoing commitment to your health goals. Please review the following plan we discussed and let me know if I can assist you in the future.   Screening recommendations/referrals: Colorectal Screening: No longer required Mammogram: No longer required Bone Density: No longer required  Vision/Dental Exams: Recommended yearly ophthalmology/optometry visit for glaucoma screening and checkup Recommended yearly dental visit for hygiene and checkup  Vaccinations: Influenza vaccine: Up to date Pneumococcal vaccine: Completed series Tdap vaccine: Up to date Shingles vaccine: Please call your insurance company to determine your out of pocket expense for the Shingrix vaccine. You may also receive this vaccine at your local pharmacy or Health Dept.  Advanced directives: Please bring a copy of your POA (Power of Attorney) and/or Living Will to your next appointment.  Conditions/risks identified: Recommend to drink at least 6-8 8oz glasses of water per day.  Next appointment: Please schedule your Annual Wellness Visit with your Nurse Health Advisor in one year.  Preventive Care 8 Years and Older, Female Preventive care refers to lifestyle choices and visits with your health care provider that can promote health and wellness. What does preventive care include?  A yearly physical exam. This is also called an annual well check.  Dental exams once or twice a year.  Routine eye exams. Ask your health care provider how often you should have your eyes checked.  Personal lifestyle choices, including:  Daily care of your teeth and gums.  Regular physical activity.  Eating a healthy diet.  Avoiding tobacco and drug use.  Limiting alcohol use.  Practicing safe sex.  Taking low-dose aspirin every day.  Taking vitamin and mineral supplements as recommended by your health care provider. What  happens during an annual well check? The services and screenings done by your health care provider during your annual well check will depend on your age, overall health, lifestyle risk factors, and family history of disease. Counseling  Your health care provider may ask you questions about your:  Alcohol use.  Tobacco use.  Drug use.  Emotional well-being.  Home and relationship well-being.  Sexual activity.  Eating habits.  History of falls.  Memory and ability to understand (cognition).  Work and work Statistician.  Reproductive health. Screening  You may have the following tests or measurements:  Height, weight, and BMI.  Blood pressure.  Lipid and cholesterol levels. These may be checked every 5 years, or more frequently if you are over 105 years old.  Skin check.  Lung cancer screening. You may have this screening every year starting at age 44 if you have a 30-pack-year history of smoking and currently smoke or have quit within the past 15 years.  Fecal occult blood test (FOBT) of the stool. You may have this test every year starting at age 80.  Flexible sigmoidoscopy or colonoscopy. You may have a sigmoidoscopy every 5 years or a colonoscopy every 10 years starting at age 55.  Hepatitis C blood test.  Hepatitis B blood test.  Sexually transmitted disease (STD) testing.  Diabetes screening. This is done by checking your blood sugar (glucose) after you have not eaten for a while (fasting). You may have this done every 1-3 years.  Bone density scan. This is done to screen for osteoporosis. You may have this done starting at age 4.  Mammogram. This may be done every 1-2 years. Talk to your health care provider about how often  you should have regular mammograms. Talk with your health care provider about your test results, treatment options, and if necessary, the need for more tests. Vaccines  Your health care provider may recommend certain vaccines, such  as:  Influenza vaccine. This is recommended every year.  Tetanus, diphtheria, and acellular pertussis (Tdap, Td) vaccine. You may need a Td booster every 10 years.  Zoster vaccine. You may need this after age 62.  Pneumococcal 13-valent conjugate (PCV13) vaccine. One dose is recommended after age 4.  Pneumococcal polysaccharide (PPSV23) vaccine. One dose is recommended after age 79. Talk to your health care provider about which screenings and vaccines you need and how often you need them. This information is not intended to replace advice given to you by your health care provider. Make sure you discuss any questions you have with your health care provider. Document Released: 08/18/2015 Document Revised: 04/10/2016 Document Reviewed: 05/23/2015 Elsevier Interactive Patient Education  2017 [place] Prevention in the Home Falls can cause injuries. They can happen to people of all ages. There are many things you can do to make your home safe and to help prevent falls. What can I do on the outside of my home?  Regularly fix the edges of walkways and driveways and fix any cracks.  Remove anything that might make you trip as you walk through a door, such as a raised step or threshold.  Trim any bushes or trees on the path to your home.  Use bright outdoor lighting.  Clear any walking paths of anything that might make someone trip, such as rocks or tools.  Regularly check to see if handrails are loose or broken. Make sure that both sides of any steps have handrails.  Any raised decks and porches should have guardrails on the edges.  Have any leaves, snow, or ice cleared regularly.  Use sand or salt on walking paths during winter.  Clean up any spills in your garage right away. This includes oil or grease spills. What can I do in the bathroom?  Use night lights.  Install grab bars by the toilet and in the tub and shower. Do not use towel bars as grab bars.  Use  non-skid mats or decals in the tub or shower.  If you need to sit down in the shower, use a plastic, non-slip stool.  Keep the floor dry. Clean up any water that spills on the floor as soon as it happens.  Remove soap buildup in the tub or shower regularly.  Attach bath mats securely with double-sided non-slip rug tape.  Do not have throw rugs and other things on the floor that can make you trip. What can I do in the bedroom?  Use night lights.  Make sure that you have a light by your bed that is easy to reach.  Do not use any sheets or blankets that are too big for your bed. They should not hang down onto the floor.  Have a firm chair that has side arms. You can use this for support while you get dressed.  Do not have throw rugs and other things on the floor that can make you trip. What can I do in the kitchen?  Clean up any spills right away.  Avoid walking on wet floors.  Keep items that you use a lot in easy-to-reach places.  If you need to reach something above you, use a strong step stool that has a grab bar.  Keep electrical cords  out of the way.  Do not use floor polish or wax that makes floors slippery. If you must use wax, use non-skid floor wax.  Do not have throw rugs and other things on the floor that can make you trip. What can I do with my stairs?  Do not leave any items on the stairs.  Make sure that there are handrails on both sides of the stairs and use them. Fix handrails that are broken or loose. Make sure that handrails are as long as the stairways.  Check any carpeting to make sure that it is firmly attached to the stairs. Fix any carpet that is loose or worn.  Avoid having throw rugs at the top or bottom of the stairs. If you do have throw rugs, attach them to the floor with carpet tape.  Make sure that you have a light switch at the top of the stairs and the bottom of the stairs. If you do not have them, ask someone to add them for you. What  else can I do to help prevent falls?  Wear shoes that:  Do not have high heels.  Have rubber bottoms.  Are comfortable and fit you well.  Are closed at the toe. Do not wear sandals.  If you use a stepladder:  Make sure that it is fully opened. Do not climb a closed stepladder.  Make sure that both sides of the stepladder are locked into place.  Ask someone to hold it for you, if possible.  Clearly mark and make sure that you can see:  Any grab bars or handrails.  First and last steps.  Where the edge of each step is.  Use tools that help you move around (mobility aids) if they are needed. These include:  Canes.  Walkers.  Scooters.  Crutches.  Turn on the lights when you go into a dark area. Replace any light bulbs as soon as they burn out.  Set up your furniture so you have a clear path. Avoid moving your furniture around.  If any of your floors are uneven, fix them.  If there are any pets around you, be aware of where they are.  Review your medicines with your doctor. Some medicines can make you feel dizzy. This can increase your chance of falling. Ask your doctor what other things that you can do to help prevent falls. This information is not intended to replace advice given to you by your health care provider. Make sure you discuss any questions you have with your health care provider. Document Released: 05/18/2009 Document Revised: 12/28/2015 Document Reviewed: 08/26/2014 Elsevier Interactive Patient Education  2017 [employer].

## 2017-11-03 ENCOUNTER — Other Ambulatory Visit: Payer: Self-pay | Admitting: Internal Medicine

## 2017-11-03 ENCOUNTER — Telehealth: Payer: Self-pay

## 2017-11-03 DIAGNOSIS — M5136 Other intervertebral disc degeneration, lumbar region: Secondary | ICD-10-CM

## 2017-11-03 DIAGNOSIS — M47816 Spondylosis without myelopathy or radiculopathy, lumbar region: Secondary | ICD-10-CM

## 2017-11-03 MED ORDER — TRAMADOL HCL 50 MG PO TABS: 50.0000 mg | ORAL_TABLET | Freq: Two times a day (BID) | ORAL | 1 refills | Status: DC | PRN

## 2017-11-03 NOTE — Telephone Encounter (Signed)
Patient called stating she was temporarily put on tramadol and has been given 14 days at a time. She called to inform you that she is tolerating the meds well and wanted to know if we could send in 92 day supply to [facility] order.   Please Advise.

## 2017-11-03 NOTE — Telephone Encounter (Signed)
Done

## 2017-11-12 DIAGNOSIS — L718 Other rosacea: Secondary | ICD-10-CM | POA: Diagnosis not present

## 2017-11-12 DIAGNOSIS — L570 Actinic keratosis: Secondary | ICD-10-CM | POA: Diagnosis not present

## 2017-11-12 DIAGNOSIS — L821 Other seborrheic keratosis: Secondary | ICD-10-CM | POA: Diagnosis not present

## 2017-11-24 DIAGNOSIS — J4521 Mild intermittent asthma with (acute) exacerbation: Secondary | ICD-10-CM | POA: Diagnosis not present

## 2017-11-24 DIAGNOSIS — R079 Chest pain, unspecified: Secondary | ICD-10-CM | POA: Diagnosis not present

## 2017-12-04 ENCOUNTER — Other Ambulatory Visit: Payer: Self-pay | Admitting: Internal Medicine

## 2017-12-31 DIAGNOSIS — M5136 Other intervertebral disc degeneration, lumbar region: Secondary | ICD-10-CM | POA: Diagnosis not present

## 2017-12-31 DIAGNOSIS — M160 Bilateral primary osteoarthritis of hip: Secondary | ICD-10-CM | POA: Diagnosis not present

## 2017-12-31 DIAGNOSIS — M533 Sacrococcygeal disorders, not elsewhere classified: Secondary | ICD-10-CM | POA: Diagnosis not present

## 2018-01-02 DIAGNOSIS — M461 Sacroiliitis, not elsewhere classified: Secondary | ICD-10-CM | POA: Diagnosis not present

## 2018-01-21 DIAGNOSIS — K0263 Dental caries on smooth surface penetrating into pulp: Secondary | ICD-10-CM | POA: Diagnosis not present

## 2018-01-26 DIAGNOSIS — M461 Sacroiliitis, not elsewhere classified: Secondary | ICD-10-CM | POA: Diagnosis not present

## 2018-01-26 DIAGNOSIS — M1611 Unilateral primary osteoarthritis, right hip: Secondary | ICD-10-CM | POA: Diagnosis not present

## 2018-02-18 DIAGNOSIS — J42 Unspecified chronic bronchitis: Secondary | ICD-10-CM | POA: Diagnosis not present

## 2018-03-11 DIAGNOSIS — I10 Essential (primary) hypertension: Secondary | ICD-10-CM | POA: Diagnosis not present

## 2018-03-11 DIAGNOSIS — M1611 Unilateral primary osteoarthritis, right hip: Secondary | ICD-10-CM | POA: Diagnosis not present

## 2018-03-11 DIAGNOSIS — Z01818 Encounter for other preprocedural examination: Secondary | ICD-10-CM | POA: Diagnosis not present

## 2018-03-11 DIAGNOSIS — Z01810 Encounter for preprocedural cardiovascular examination: Secondary | ICD-10-CM | POA: Diagnosis not present

## 2018-03-11 DIAGNOSIS — E039 Hypothyroidism, unspecified: Secondary | ICD-10-CM | POA: Diagnosis not present

## 2018-03-11 DIAGNOSIS — Z01812 Encounter for preprocedural laboratory examination: Secondary | ICD-10-CM | POA: Diagnosis not present

## 2018-03-11 DIAGNOSIS — Z01811 Encounter for preprocedural respiratory examination: Secondary | ICD-10-CM | POA: Diagnosis not present

## 2018-03-17 DIAGNOSIS — M25551 Pain in right hip: Secondary | ICD-10-CM | POA: Diagnosis not present

## 2018-03-17 DIAGNOSIS — R262 Difficulty in walking, not elsewhere classified: Secondary | ICD-10-CM | POA: Diagnosis not present

## 2018-03-17 DIAGNOSIS — I10 Essential (primary) hypertension: Secondary | ICD-10-CM | POA: Diagnosis not present

## 2018-03-17 DIAGNOSIS — G43909 Migraine, unspecified, not intractable, without status migrainosus: Secondary | ICD-10-CM | POA: Diagnosis present

## 2018-03-17 DIAGNOSIS — I129 Hypertensive chronic kidney disease with stage 1 through stage 4 chronic kidney disease, or unspecified chronic kidney disease: Secondary | ICD-10-CM | POA: Diagnosis present

## 2018-03-17 DIAGNOSIS — M1611 Unilateral primary osteoarthritis, right hip: Secondary | ICD-10-CM | POA: Diagnosis not present

## 2018-03-17 DIAGNOSIS — K219 Gastro-esophageal reflux disease without esophagitis: Secondary | ICD-10-CM | POA: Diagnosis not present

## 2018-03-17 DIAGNOSIS — R2689 Other abnormalities of gait and mobility: Secondary | ICD-10-CM | POA: Diagnosis not present

## 2018-03-17 DIAGNOSIS — Z471 Aftercare following joint replacement surgery: Secondary | ICD-10-CM | POA: Diagnosis not present

## 2018-03-17 DIAGNOSIS — Z96641 Presence of right artificial hip joint: Secondary | ICD-10-CM | POA: Diagnosis not present

## 2018-03-17 DIAGNOSIS — E785 Hyperlipidemia, unspecified: Secondary | ICD-10-CM | POA: Diagnosis present

## 2018-03-17 DIAGNOSIS — Z87891 Personal history of nicotine dependence: Secondary | ICD-10-CM | POA: Diagnosis not present

## 2018-03-17 DIAGNOSIS — E039 Hypothyroidism, unspecified: Secondary | ICD-10-CM | POA: Diagnosis not present

## 2018-03-17 DIAGNOSIS — N189 Chronic kidney disease, unspecified: Secondary | ICD-10-CM | POA: Diagnosis present

## 2018-03-17 DIAGNOSIS — R002 Palpitations: Secondary | ICD-10-CM | POA: Diagnosis present

## 2018-03-17 DIAGNOSIS — I739 Peripheral vascular disease, unspecified: Secondary | ICD-10-CM | POA: Diagnosis present

## 2018-03-17 DIAGNOSIS — J45909 Unspecified asthma, uncomplicated: Secondary | ICD-10-CM | POA: Diagnosis not present

## 2018-03-17 DIAGNOSIS — D649 Anemia, unspecified: Secondary | ICD-10-CM | POA: Diagnosis not present

## 2018-03-17 DIAGNOSIS — F419 Anxiety disorder, unspecified: Secondary | ICD-10-CM | POA: Diagnosis present

## 2018-03-17 DIAGNOSIS — G4700 Insomnia, unspecified: Secondary | ICD-10-CM | POA: Diagnosis present

## 2018-03-20 DIAGNOSIS — R262 Difficulty in walking, not elsewhere classified: Secondary | ICD-10-CM | POA: Diagnosis not present

## 2018-03-20 DIAGNOSIS — R2689 Other abnormalities of gait and mobility: Secondary | ICD-10-CM | POA: Diagnosis not present

## 2018-03-20 DIAGNOSIS — J449 Chronic obstructive pulmonary disease, unspecified: Secondary | ICD-10-CM | POA: Diagnosis not present

## 2018-03-20 DIAGNOSIS — Z96649 Presence of unspecified artificial hip joint: Secondary | ICD-10-CM | POA: Diagnosis not present

## 2018-03-20 DIAGNOSIS — Z96641 Presence of right artificial hip joint: Secondary | ICD-10-CM | POA: Diagnosis not present

## 2018-03-20 DIAGNOSIS — Z471 Aftercare following joint replacement surgery: Secondary | ICD-10-CM | POA: Diagnosis not present

## 2018-03-20 DIAGNOSIS — I10 Essential (primary) hypertension: Secondary | ICD-10-CM | POA: Diagnosis not present

## 2018-03-20 DIAGNOSIS — D649 Anemia, unspecified: Secondary | ICD-10-CM | POA: Diagnosis not present

## 2018-03-20 DIAGNOSIS — E039 Hypothyroidism, unspecified: Secondary | ICD-10-CM | POA: Diagnosis not present

## 2018-03-23 DIAGNOSIS — I10 Essential (primary) hypertension: Secondary | ICD-10-CM | POA: Diagnosis not present

## 2018-03-23 DIAGNOSIS — E039 Hypothyroidism, unspecified: Secondary | ICD-10-CM | POA: Diagnosis not present

## 2018-03-23 DIAGNOSIS — J449 Chronic obstructive pulmonary disease, unspecified: Secondary | ICD-10-CM | POA: Diagnosis not present

## 2018-03-23 DIAGNOSIS — Z96649 Presence of unspecified artificial hip joint: Secondary | ICD-10-CM | POA: Diagnosis not present

## 2018-03-27 ENCOUNTER — Other Ambulatory Visit: Payer: Self-pay | Admitting: Internal Medicine

## 2018-03-28 DIAGNOSIS — S76211A Strain of adductor muscle, fascia and tendon of right thigh, initial encounter: Secondary | ICD-10-CM | POA: Diagnosis not present

## 2018-03-30 DIAGNOSIS — M6281 Muscle weakness (generalized): Secondary | ICD-10-CM | POA: Diagnosis not present

## 2018-03-30 DIAGNOSIS — R269 Unspecified abnormalities of gait and mobility: Secondary | ICD-10-CM | POA: Diagnosis not present

## 2018-03-30 DIAGNOSIS — M25551 Pain in right hip: Secondary | ICD-10-CM | POA: Diagnosis not present

## 2018-04-02 DIAGNOSIS — M6281 Muscle weakness (generalized): Secondary | ICD-10-CM | POA: Diagnosis not present

## 2018-04-02 DIAGNOSIS — R269 Unspecified abnormalities of gait and mobility: Secondary | ICD-10-CM | POA: Diagnosis not present

## 2018-04-02 DIAGNOSIS — M25551 Pain in right hip: Secondary | ICD-10-CM | POA: Diagnosis not present

## 2018-04-08 DIAGNOSIS — M25551 Pain in right hip: Secondary | ICD-10-CM | POA: Diagnosis not present

## 2018-04-08 DIAGNOSIS — M6281 Muscle weakness (generalized): Secondary | ICD-10-CM | POA: Diagnosis not present

## 2018-04-08 DIAGNOSIS — R269 Unspecified abnormalities of gait and mobility: Secondary | ICD-10-CM | POA: Diagnosis not present

## 2018-04-10 ENCOUNTER — Other Ambulatory Visit: Payer: Self-pay | Admitting: Internal Medicine

## 2018-04-10 DIAGNOSIS — M25551 Pain in right hip: Secondary | ICD-10-CM | POA: Diagnosis not present

## 2018-04-10 DIAGNOSIS — R269 Unspecified abnormalities of gait and mobility: Secondary | ICD-10-CM | POA: Diagnosis not present

## 2018-04-10 DIAGNOSIS — E034 Atrophy of thyroid (acquired): Secondary | ICD-10-CM

## 2018-04-10 DIAGNOSIS — M6281 Muscle weakness (generalized): Secondary | ICD-10-CM | POA: Diagnosis not present

## 2018-04-14 DIAGNOSIS — M25551 Pain in right hip: Secondary | ICD-10-CM | POA: Diagnosis not present

## 2018-04-14 DIAGNOSIS — M6281 Muscle weakness (generalized): Secondary | ICD-10-CM | POA: Diagnosis not present

## 2018-04-14 DIAGNOSIS — R269 Unspecified abnormalities of gait and mobility: Secondary | ICD-10-CM | POA: Diagnosis not present

## 2018-04-17 DIAGNOSIS — R269 Unspecified abnormalities of gait and mobility: Secondary | ICD-10-CM | POA: Diagnosis not present

## 2018-04-17 DIAGNOSIS — M6281 Muscle weakness (generalized): Secondary | ICD-10-CM | POA: Diagnosis not present

## 2018-04-17 DIAGNOSIS — M25551 Pain in right hip: Secondary | ICD-10-CM | POA: Diagnosis not present

## 2018-04-21 DIAGNOSIS — R269 Unspecified abnormalities of gait and mobility: Secondary | ICD-10-CM | POA: Diagnosis not present

## 2018-04-21 DIAGNOSIS — M6281 Muscle weakness (generalized): Secondary | ICD-10-CM | POA: Diagnosis not present

## 2018-04-21 DIAGNOSIS — M25551 Pain in right hip: Secondary | ICD-10-CM | POA: Diagnosis not present

## 2018-04-27 ENCOUNTER — Encounter: Payer: Medicare Other | Admitting: Internal Medicine

## 2018-04-27 DIAGNOSIS — Z96641 Presence of right artificial hip joint: Secondary | ICD-10-CM | POA: Diagnosis not present

## 2018-04-28 DIAGNOSIS — M25551 Pain in right hip: Secondary | ICD-10-CM | POA: Diagnosis not present

## 2018-04-28 DIAGNOSIS — M6281 Muscle weakness (generalized): Secondary | ICD-10-CM | POA: Diagnosis not present

## 2018-04-28 DIAGNOSIS — R269 Unspecified abnormalities of gait and mobility: Secondary | ICD-10-CM | POA: Diagnosis not present

## 2018-05-01 DIAGNOSIS — R269 Unspecified abnormalities of gait and mobility: Secondary | ICD-10-CM | POA: Diagnosis not present

## 2018-05-01 DIAGNOSIS — M6281 Muscle weakness (generalized): Secondary | ICD-10-CM | POA: Diagnosis not present

## 2018-05-01 DIAGNOSIS — M25551 Pain in right hip: Secondary | ICD-10-CM | POA: Diagnosis not present

## 2018-05-04 DIAGNOSIS — M6281 Muscle weakness (generalized): Secondary | ICD-10-CM | POA: Diagnosis not present

## 2018-05-04 DIAGNOSIS — M25551 Pain in right hip: Secondary | ICD-10-CM | POA: Diagnosis not present

## 2018-05-04 DIAGNOSIS — R269 Unspecified abnormalities of gait and mobility: Secondary | ICD-10-CM | POA: Diagnosis not present

## 2018-05-08 DIAGNOSIS — M6281 Muscle weakness (generalized): Secondary | ICD-10-CM | POA: Diagnosis not present

## 2018-05-08 DIAGNOSIS — M25551 Pain in right hip: Secondary | ICD-10-CM | POA: Diagnosis not present

## 2018-05-08 DIAGNOSIS — R269 Unspecified abnormalities of gait and mobility: Secondary | ICD-10-CM | POA: Diagnosis not present

## 2018-05-12 DIAGNOSIS — M25551 Pain in right hip: Secondary | ICD-10-CM | POA: Diagnosis not present

## 2018-05-12 DIAGNOSIS — M6281 Muscle weakness (generalized): Secondary | ICD-10-CM | POA: Diagnosis not present

## 2018-05-12 DIAGNOSIS — R269 Unspecified abnormalities of gait and mobility: Secondary | ICD-10-CM | POA: Diagnosis not present

## 2018-05-15 DIAGNOSIS — Z23 Encounter for immunization: Secondary | ICD-10-CM | POA: Diagnosis not present

## 2018-05-19 DIAGNOSIS — R269 Unspecified abnormalities of gait and mobility: Secondary | ICD-10-CM | POA: Diagnosis not present

## 2018-05-19 DIAGNOSIS — M6281 Muscle weakness (generalized): Secondary | ICD-10-CM | POA: Diagnosis not present

## 2018-05-19 DIAGNOSIS — M25551 Pain in right hip: Secondary | ICD-10-CM | POA: Diagnosis not present

## 2018-05-25 DIAGNOSIS — M25551 Pain in right hip: Secondary | ICD-10-CM | POA: Diagnosis not present

## 2018-05-25 DIAGNOSIS — M6281 Muscle weakness (generalized): Secondary | ICD-10-CM | POA: Diagnosis not present

## 2018-05-25 DIAGNOSIS — R269 Unspecified abnormalities of gait and mobility: Secondary | ICD-10-CM | POA: Diagnosis not present

## 2018-05-28 DIAGNOSIS — M1712 Unilateral primary osteoarthritis, left knee: Secondary | ICD-10-CM | POA: Diagnosis not present

## 2018-06-15 DIAGNOSIS — Z96641 Presence of right artificial hip joint: Secondary | ICD-10-CM | POA: Diagnosis not present

## 2018-07-07 ENCOUNTER — Other Ambulatory Visit: Payer: Self-pay | Admitting: Internal Medicine

## 2018-08-21 ENCOUNTER — Encounter: Payer: Self-pay | Admitting: Internal Medicine

## 2018-08-21 ENCOUNTER — Ambulatory Visit (INDEPENDENT_AMBULATORY_CARE_PROVIDER_SITE_OTHER): Payer: Medicare Other | Admitting: Internal Medicine

## 2018-08-21 VITALS — BP 132/81 | HR 78 | Ht 63.0 in | Wt 166.4 lb

## 2018-08-21 DIAGNOSIS — I10 Essential (primary) hypertension: Secondary | ICD-10-CM

## 2018-08-21 DIAGNOSIS — M47816 Spondylosis without myelopathy or radiculopathy, lumbar region: Secondary | ICD-10-CM | POA: Diagnosis not present

## 2018-08-21 DIAGNOSIS — E034 Atrophy of thyroid (acquired): Secondary | ICD-10-CM | POA: Diagnosis not present

## 2018-08-21 DIAGNOSIS — E785 Hyperlipidemia, unspecified: Secondary | ICD-10-CM

## 2018-08-21 DIAGNOSIS — K2100 Gastro-esophageal reflux disease with esophagitis, without bleeding: Secondary | ICD-10-CM

## 2018-08-21 DIAGNOSIS — N289 Disorder of kidney and ureter, unspecified: Secondary | ICD-10-CM

## 2018-08-21 DIAGNOSIS — J4520 Mild intermittent asthma, uncomplicated: Secondary | ICD-10-CM | POA: Diagnosis not present

## 2018-08-21 DIAGNOSIS — Z1231 Encounter for screening mammogram for malignant neoplasm of breast: Secondary | ICD-10-CM

## 2018-08-21 LAB — POCT URINALYSIS DIPSTICK
Bilirubin, UA: NEGATIVE
Blood, UA: NEGATIVE
Glucose, UA: NEGATIVE
Ketones, UA: NEGATIVE
Leukocytes, UA: NEGATIVE
Nitrite, UA: NEGATIVE
PH UA: 6 (ref 5.0–8.0)
Protein, UA: NEGATIVE
SPEC GRAV UA: 1.015 (ref 1.010–1.025)
Urobilinogen, UA: 0.2 E.U./dL

## 2018-08-21 MED ORDER — HYDROCHLOROTHIAZIDE 12.5 MG PO CAPS: 12.5000 mg | ORAL_CAPSULE | Freq: Every day | ORAL | 1 refills | Status: DC | PRN

## 2018-08-21 MED ORDER — AMLODIPINE BESYLATE 10 MG PO TABS: 10.0000 mg | ORAL_TABLET | Freq: Every day | ORAL | 1 refills | Status: DC

## 2018-08-21 MED ORDER — PANTOPRAZOLE SODIUM 40 MG PO TBEC: 40.0000 mg | DELAYED_RELEASE_TABLET | Freq: Every day | ORAL | 1 refills | Status: DC

## 2018-08-21 NOTE — Progress Notes (Signed)
Date:  08/21/2018   Name:  Crystal Zavala   DOB:  19-Jan-1932   MRN:  [id number]   Chief Complaint: Hypertension (Medicare Yearly Check Up. Breast Exam. ); Hypothyroidism (Refill.); and Gastroesophageal Reflux Crystal Zavala is a 85 y.o. female who presents today for her Annual Exam. She feels well. She reports exercising regularly - doing well since hip surgery. She reports she is sleeping well.   Hypertension  This is a chronic problem. The problem is controlled. Associated symptoms include shortness of breath (mild, intermittent). Pertinent negatives include no chest pain, headaches or palpitations. Past treatments include calcium channel blockers and diuretics. Identifiable causes of hypertension include a thyroid problem.  Gastroesophageal Reflux  She reports no abdominal pain, no chest pain, no coughing or no wheezing. This is a recurrent problem. The problem occurs rarely. Pertinent negatives include no fatigue. She has tried a PPI for the symptoms.  Thyroid Problem  Presents for follow-up visit. Patient reports no anxiety, constipation, diarrhea, fatigue, palpitations or tremors. The symptoms have been stable.  Asthma  She complains of shortness of breath (mild, intermittent). There is no cough or wheezing. The problem has been unchanged. Pertinent negatives include no chest pain, fever, headaches or trouble swallowing. Her past medical history is significant for asthma.    Review of Systems  Constitutional: Negative for chills, fatigue and fever.  HENT: Negative for congestion, hearing loss, tinnitus, trouble swallowing and voice change.   Eyes: Negative for visual disturbance.  Respiratory: Positive for shortness of breath (mild, intermittent). Negative for cough, chest tightness and wheezing.   Cardiovascular: Negative for chest pain, palpitations and leg swelling.  Gastrointestinal: Negative for abdominal pain, constipation, diarrhea and vomiting.  Endocrine: Negative for  polydipsia and polyuria.  Genitourinary: Negative for dysuria, frequency, genital sores, vaginal bleeding and vaginal discharge.  Musculoskeletal: Positive for arthralgias (knee and wrists) and back pain. Negative for gait problem and joint swelling.  Skin: Negative for color change and rash.  Allergic/Immunologic: Negative for environmental allergies.  Neurological: Negative for dizziness, tremors, light-headedness and headaches.  Hematological: Negative for adenopathy. Does not bruise/bleed easily.  Psychiatric/Behavioral: Negative for dysphoric mood and sleep disturbance. The patient is not nervous/anxious.     Patient Active Problem List   Diagnosis Date Noted  . Pain of both hip joints 04/22/2017  . Renal insufficiency 01/05/2017  . 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 Spondylosis of lumbar region without myelopathy or radiculopathy 10/21/2016  . Hypothyroidism due to acquired atrophy of thyroid 04/22/2016  . Insomnia 04/22/2016  . DDD (degenerative disc disease), lumbar 04/22/2016  . Breast mass, right 01/10/2016  . Aortic atherosclerosis ([place]) 01/18/2015  . Arthritis of neck 01/18/2015  . Dermatitis, eczematoid 01/18/2015  . Essential (primary) hypertension 01/18/2015  . Esophagitis, reflux 01/18/2015  . H/O adenomatous polyp of colon 01/18/2015  . Migraine without aura and responsive to treatment 01/18/2015  . Asthma, mild intermittent 01/18/2015  . Hyperlipidemia, mild 01/18/2015  . Lung nodule, multiple 01/18/2015  . Kidney cysts 01/18/2015  . Tobacco use disorder, moderate, in sustained remission 01/18/2015  . Temporary cerebral vascular dysfunction 01/18/2015    Allergies  Allergen Reactions  . Ace Inhibitors Cough    Past Surgical History:  Procedure Laterality Date  . ABDOMINAL HYSTERECTOMY    . BLEPHAROPLASTY Bilateral 2013  . BREAST BIOPSY Left 2009   benign  . COLONOSCOPY  2006   Normal  . ESI  01/2017   L5-S1 @ [facility]   .  ESOPHAGOGASTRODUODENOSCOPY  2015   Dilation of esophageal stricture  . TOTAL HIP ARTHROPLASTY Right 2019    Social History   Tobacco Use  . Smoking status: Former Smoker    Packs/day: 0.50    Years: 39.00    Pack years: 19.50    Types: Cigarettes    Last attempt to quit: 1992    Years since quitting: 28.0  . Smokeless tobacco: Never Used  . Tobacco comment: smoking cessation materails not required  Substance Use Topics  . Alcohol use: No    Alcohol/week: 0.0 standard drinks  . Drug use: No     Medication list has been reviewed and updated.  Current Meds  Medication Sig  . acetaminophen (TYLENOL) 500 MG tablet Take 500 mg by mouth every 6 (six) hours as needed.   Marland Kitchen albuterol (PROAIR HFA) 108 (90 Base) MCG/ACT inhaler Inhale 2 puffs into the lungs 4 (four) times daily as needed.  Marland Kitchen amLODipine (NORVASC) 10 MG tablet Take 10 mg by mouth daily.  . celecoxib (CELEBREX) 200 MG capsule Take 200 mg by mouth 2 (two) times daily.  . diazepam (VALIUM) 10 MG tablet TAKE 1 TABLET BY MOUTH EVERY NIGHT AT BEDTIME AS NEEDED  . Fluticasone-Salmeterol (ADVAIR DISKUS) 250-50 MCG/DOSE AEPB Inhale 1 puff into the lungs 2 (two) times daily.  . hydrochlorothiazide (MICROZIDE) 12.5 MG capsule Take 12.5 mg by mouth daily as needed.   Marland Kitchen levothyroxine (SYNTHROID, LEVOTHROID) 88 MCG tablet TAKE 1 TABLET DAILY BEFORE BREAKFAST  . pantoprazole (PROTONIX) 40 MG tablet Take 40 mg by mouth daily.  . traMADol (ULTRAM) 50 MG tablet Take 1 tablet (50 mg total) by mouth every 12 (twelve) hours as needed.    PHQ 2/9 Scores 08/21/2018 10/23/2017 10/22/2017 10/21/2016  PHQ - 2 Score 0 0 0 0  PHQ- 9 Score - 0 - -    Physical Exam Vitals signs and nursing note reviewed.  Constitutional:      General: She is not in acute distress.    Appearance: She is well-developed.  HENT:     Head: Normocephalic and atraumatic.     Right Ear: Tympanic membrane and ear canal normal.     Left Ear: Tympanic membrane and ear  canal normal.     Nose:     Right Sinus: No maxillary sinus tenderness.     Left Sinus: No maxillary sinus tenderness.     Mouth/Throat:     Pharynx: Uvula midline.  Eyes:     General: No scleral icterus.       Right eye: No discharge.        Left eye: No discharge.     Conjunctiva/sclera: Conjunctivae normal.  Neck:     Musculoskeletal: Normal range of motion. No erythema.     Thyroid: No thyromegaly.     Vascular: No carotid bruit.  Cardiovascular:     Rate and Rhythm: Normal rate and regular rhythm.     Pulses: Normal pulses.     Heart sounds: Normal heart sounds.  Pulmonary:     Effort: Pulmonary effort is normal. No respiratory distress.     Breath sounds: No wheezing.  Chest:     Breasts:        Right: No mass, nipple discharge, skin change or tenderness.        Left: No mass, nipple discharge, skin change or tenderness.  Abdominal:     General: Bowel sounds are normal.     Palpations: Abdomen is soft.  Tenderness: There is no abdominal tenderness.  Musculoskeletal: Normal range of motion.  Lymphadenopathy:     Cervical: No cervical adenopathy.  Skin:    General: Skin is warm and dry.     Findings: No rash.  Neurological:     Mental Status: She is alert and oriented to person, place, and time.     Cranial Nerves: No cranial nerve deficit.     Sensory: No sensory deficit.     Deep Tendon Reflexes: Reflexes are normal and symmetric.  Psychiatric:        Speech: Speech normal.        Behavior: Behavior normal.        Thought Content: Thought content normal.     BP 132/81 (BP Location: Right Arm, Patient Position: Sitting, Cuff Size: Normal)   Pulse 78   Ht 5\' 3"  (1.6 m)   Wt 166 lb 6.4 oz (75.5 kg)   SpO2 98%   BMI 29.48 kg/m   Assessment and Plan: 1. Essential (primary) hypertension controlled - hydrochlorothiazide (MICROZIDE) 12.5 MG capsule; Take 1 capsule (12.5 mg total) by mouth daily as needed.  Dispense: 90 capsule; Refill: 1 - amLODipine  (NORVASC) 10 MG tablet; Take 1 tablet (10 mg total) by mouth daily.  Dispense: 90 tablet; Refill: 1 - CBC with Differential/Platelet - Comprehensive metabolic panel - POCT urinalysis dipstick  2. Hypothyroidism due to acquired atrophy of thyroid supplemented - TSH  3. Mild intermittent asthma without complication Continue current therapy with Advair and follow up with Pulmonary  4. Renal insufficiency stable - Comprehensive metabolic panel  5. Hyperlipidemia, mild Mild, on no medication - Lipid panel  6. Esophagitis, reflux Continue daily PPI and follow up with GI - pantoprazole (PROTONIX) 40 MG tablet; Take 1 tablet (40 mg total) by mouth daily.  Dispense: 90 tablet; Refill: 1 - CBC with Differential/Platelet  7. Encounter for screening mammogram for breast cancer Schedule at [facility] - MM 3D SCREEN BREAST BILATERAL; Future  8. Spondylosis of lumbar region without myelopathy or radiculopathy Stable, continue celebrex   Partially dictated using Editor, commissioning. Any errors are unintentional.  Halina Maidens, MD [facility]  08/21/2018

## 2018-08-22 LAB — CBC WITH DIFFERENTIAL/PLATELET
BASOS ABS: 0.1 10*3/uL (ref 0.0–0.2)
Basos: 1 %
EOS (ABSOLUTE): 0.3 10*3/uL (ref 0.0–0.4)
Eos: 4 %
Hematocrit: 42 % (ref 34.0–46.6)
Hemoglobin: 13.6 g/dL (ref 11.1–15.9)
Immature Grans (Abs): 0 10*3/uL (ref 0.0–0.1)
Immature Granulocytes: 0 %
Lymphocytes Absolute: 1.5 10*3/uL (ref 0.7–3.1)
Lymphs: 19 %
MCH: 30.4 pg (ref 26.6–33.0)
MCHC: 32.4 g/dL (ref 31.5–35.7)
MCV: 94 fL (ref 79–97)
MONOS ABS: 0.6 10*3/uL (ref 0.1–0.9)
Monocytes: 7 %
Neutrophils Absolute: 5.7 10*3/uL (ref 1.4–7.0)
Neutrophils: 69 %
Platelets: 324 10*3/uL (ref 150–450)
RBC: 4.47 x10E6/uL (ref 3.77–5.28)
RDW: 14.5 % (ref 11.7–15.4)
WBC: 8.2 10*3/uL (ref 3.4–10.8)

## 2018-08-22 LAB — COMPREHENSIVE METABOLIC PANEL
ALK PHOS: 113 IU/L (ref 39–117)
ALT: 13 IU/L (ref 0–32)
AST: 20 IU/L (ref 0–40)
Albumin/Globulin Ratio: 1.6 (ref 1.2–2.2)
Albumin: 4.2 g/dL (ref 3.5–4.7)
BILIRUBIN TOTAL: 0.5 mg/dL (ref 0.0–1.2)
BUN/Creatinine Ratio: 20 (ref 12–28)
BUN: 20 mg/dL (ref 8–27)
CO2: 24 mmol/L (ref 20–29)
Calcium: 10.3 mg/dL (ref 8.7–10.3)
Chloride: 101 mmol/L (ref 96–106)
Creatinine, Ser: 1.02 mg/dL — ABNORMAL HIGH (ref 0.57–1.00)
GFR calc Af Amer: 58 mL/min/{1.73_m2} — ABNORMAL LOW (ref >59)
GFR calc non Af Amer: 50 mL/min/{1.73_m2} — ABNORMAL LOW (ref >59)
Globulin, Total: 2.7 g/dL (ref 1.5–4.5)
Glucose: 97 mg/dL (ref 65–99)
Potassium: 4.7 mmol/L (ref 3.5–5.2)
Sodium: 140 mmol/L (ref 134–144)
Total Protein: 6.9 g/dL (ref 6.0–8.5)

## 2018-08-22 LAB — LIPID PANEL
CHOLESTEROL TOTAL: 252 mg/dL — AB (ref 100–199)
Chol/HDL Ratio: 3.2 ratio (ref 0.0–4.4)
HDL: 80 mg/dL (ref >39)
LDL Calculated: 153 mg/dL — ABNORMAL HIGH (ref 0–99)
Triglycerides: 94 mg/dL (ref 0–149)
VLDL Cholesterol Cal: 19 mg/dL (ref 5–40)

## 2018-08-22 LAB — TSH: TSH: 2.08 u[IU]/mL (ref 0.450–4.500)

## 2018-09-02 DIAGNOSIS — M1712 Unilateral primary osteoarthritis, left knee: Secondary | ICD-10-CM | POA: Diagnosis not present

## 2018-09-03 ENCOUNTER — Other Ambulatory Visit: Payer: Self-pay

## 2018-09-03 DIAGNOSIS — Z886 Allergy status to analgesic agent status: Secondary | ICD-10-CM

## 2018-09-03 HISTORY — DX: Allergy status to analgesic agent: Z88.6

## 2018-09-10 DIAGNOSIS — D3610 Benign neoplasm of peripheral nerves and autonomic nervous system, unspecified: Secondary | ICD-10-CM | POA: Diagnosis not present

## 2018-09-10 DIAGNOSIS — R1909 Other intra-abdominal and pelvic swelling, mass and lump: Secondary | ICD-10-CM | POA: Diagnosis not present

## 2018-09-16 DIAGNOSIS — Z96641 Presence of right artificial hip joint: Secondary | ICD-10-CM | POA: Diagnosis not present

## 2018-09-21 DIAGNOSIS — Z1231 Encounter for screening mammogram for malignant neoplasm of breast: Secondary | ICD-10-CM | POA: Diagnosis not present

## 2018-10-23 DIAGNOSIS — M461 Sacroiliitis, not elsewhere classified: Secondary | ICD-10-CM | POA: Diagnosis not present

## 2018-10-26 ENCOUNTER — Ambulatory Visit: Payer: Medicare Other

## 2018-11-30 ENCOUNTER — Ambulatory Visit (INDEPENDENT_AMBULATORY_CARE_PROVIDER_SITE_OTHER): Payer: Medicare Other

## 2018-11-30 ENCOUNTER — Other Ambulatory Visit: Payer: Self-pay

## 2018-11-30 ENCOUNTER — Encounter: Payer: Self-pay | Admitting: Internal Medicine

## 2018-11-30 ENCOUNTER — Ambulatory Visit (INDEPENDENT_AMBULATORY_CARE_PROVIDER_SITE_OTHER): Payer: Medicare Other | Admitting: Internal Medicine

## 2018-11-30 VITALS — BP 128/78 | HR 79 | Ht 63.0 in | Wt 169.0 lb

## 2018-11-30 VITALS — BP 128/78 | HR 82 | Temp 98.1°F | Resp 17 | Ht 63.0 in | Wt 169.0 lb

## 2018-11-30 DIAGNOSIS — I10 Essential (primary) hypertension: Secondary | ICD-10-CM

## 2018-11-30 DIAGNOSIS — M47816 Spondylosis without myelopathy or radiculopathy, lumbar region: Secondary | ICD-10-CM

## 2018-11-30 DIAGNOSIS — Z0000 Encounter for general adult medical examination without abnormal findings: Secondary | ICD-10-CM

## 2018-11-30 DIAGNOSIS — K2100 Gastro-esophageal reflux disease with esophagitis, without bleeding: Secondary | ICD-10-CM

## 2018-11-30 MED ORDER — PANTOPRAZOLE SODIUM 40 MG PO TBEC: 40.0000 mg | DELAYED_RELEASE_TABLET | Freq: Every day | ORAL | 1 refills | Status: DC

## 2018-11-30 MED ORDER — HYDROCHLOROTHIAZIDE 12.5 MG PO CAPS: 12.5000 mg | ORAL_CAPSULE | Freq: Every day | ORAL | 1 refills | Status: DC | PRN

## 2018-11-30 NOTE — Progress Notes (Signed)
Date:  11/30/2018   Name:  Crystal Zavala   DOB:  1931-12-28   MRN:  [id number]   Chief Complaint: Edema (Both legs. Not swelling now. Swell mostly at night and late in afternoon. )  Hypertension  This is a chronic problem. The problem is controlled. Associated symptoms include headaches and neck pain (and right sided occipital pain). Pertinent negatives include no chest pain, palpitations or shortness of breath. Past treatments include calcium channel blockers and diuretics. The current treatment provides significant improvement.  Gastroesophageal Reflux  She reports no abdominal pain, no chest pain or no wheezing. This is a recurrent problem. The problem occurs occasionally. Pertinent negatives include no fatigue. She has tried a PPI for the symptoms.  Back Pain  This is a chronic problem. The problem is unchanged. The pain is present in the lumbar spine. The pain is moderate. Associated symptoms include headaches. Pertinent negatives include no abdominal pain, chest pain, dysuria, fever, numbness, perianal numbness or weakness. She has tried analgesics (and ESI) for the symptoms.   Edema - over the past week she has had more swelling around her ankles at the end of the day.  No pain or redness.  Overnight she has frequent urination and her ankles are normal in the AM.  She also has leg cramps which are relieved in large part by quinine water.  She has not changed her diet and activity level.  She does not consume large amounts of sodium.  She is on HCTZ and amlodipine 10 mg.  She is limited in activity by her chronic back pain.  Review of Systems  Constitutional: Negative for chills, fatigue and fever.  HENT: Negative for trouble swallowing.   Respiratory: Negative for chest tightness, shortness of breath and wheezing.   Cardiovascular: Positive for leg swelling. Negative for chest pain and palpitations.  Gastrointestinal: Negative for abdominal pain, constipation and diarrhea.   Genitourinary: Negative for dysuria and hematuria.  Musculoskeletal: Positive for back pain and neck pain (and right sided occipital pain).  Skin: Negative for color change and rash.  Neurological: Positive for dizziness and headaches. Negative for weakness, light-headedness and numbness.  Psychiatric/Behavioral: Negative for sleep disturbance.    Patient Active Problem List   Diagnosis Date Noted  . Allergy to tramadol 09/03/2018  . Pain of both hip joints 04/22/2017  . Renal insufficiency 01/05/2017  . 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 Spondylosis of lumbar region without myelopathy or radiculopathy 10/21/2016  . Hypothyroidism due to acquired atrophy of thyroid 04/22/2016  . Insomnia 04/22/2016  . DDD (degenerative disc disease), lumbar 04/22/2016  . Breast mass, right 01/10/2016  . Aortic atherosclerosis ([place]) 01/18/2015  . Arthritis of neck 01/18/2015  . Dermatitis, eczematoid 01/18/2015  . Essential (primary) hypertension 01/18/2015  . Esophagitis, reflux 01/18/2015  . H/O adenomatous polyp of colon 01/18/2015  . Migraine without aura and responsive to treatment 01/18/2015  . Asthma, mild intermittent 01/18/2015  . Hyperlipidemia, mild 01/18/2015  . Lung nodule, multiple 01/18/2015  . Kidney cysts 01/18/2015  . Tobacco use disorder, moderate, in sustained remission 01/18/2015  . Temporary cerebral vascular dysfunction 01/18/2015    Allergies  Allergen Reactions  . Tramadol Nausea Only and Rash  . Ace Inhibitors Cough    Past Surgical History:  Procedure Laterality Date  . ABDOMINAL HYSTERECTOMY    . BLEPHAROPLASTY Bilateral 2013  . BREAST BIOPSY Left 2009   benign  . COLONOSCOPY  2006   Normal  .  ESI  01/2017   L5-S1 @ [facility]  . ESOPHAGOGASTRODUODENOSCOPY  2015   Dilation of esophageal stricture  . TOTAL HIP ARTHROPLASTY Right 2019    Social History   Tobacco Use  . Smoking status: Former Smoker    Packs/day: 0.50     Years: 39.00    Pack years: 19.50    Types: Cigarettes    Last attempt to quit: 1992    Years since quitting: 28.3  . Smokeless tobacco: Never Used  . Tobacco comment: smoking cessation materails not required  Substance Use Topics  . Alcohol use: No    Alcohol/week: 0.0 standard drinks  . Drug use: No     Medication list has been reviewed and updated.  Current Meds  Medication Sig  . acetaminophen (TYLENOL) 500 MG tablet Take 500 mg by mouth every 6 (six) hours as needed.   Marland Kitchen albuterol (PROAIR HFA) 108 (90 Base) MCG/ACT inhaler Inhale 2 puffs into the lungs 4 (four) times daily as needed.  Marland Kitchen amLODipine (NORVASC) 10 MG tablet Take 1 tablet (10 mg total) by mouth daily.  . celecoxib (CELEBREX) 200 MG capsule Take 200 mg by mouth 2 (two) times daily. Pt taking once a day  . cetirizine (ZYRTEC) 10 MG tablet Take 10 mg by mouth daily.  . diazepam (VALIUM) 10 MG tablet TAKE 1 TABLET BY MOUTH EVERY NIGHT AT BEDTIME AS NEEDED  . Fluticasone-Salmeterol (ADVAIR DISKUS) 250-50 MCG/DOSE AEPB Inhale 1 puff into the lungs 2 (two) times daily.  . hydrochlorothiazide (MICROZIDE) 12.5 MG capsule Take 1 capsule (12.5 mg total) by mouth daily as needed.  Marland Kitchen levothyroxine (SYNTHROID, LEVOTHROID) 88 MCG tablet TAKE 1 TABLET DAILY BEFORE BREAKFAST  . pantoprazole (PROTONIX) 40 MG tablet Take 1 tablet (40 mg total) by mouth daily.    PHQ 2/9 Scores 11/30/2018 08/21/2018 10/23/2017 10/22/2017  PHQ - 2 Score 0 0 0 0  PHQ- 9 Score - - 0 -    BP Readings from Last 3 Encounters:  11/30/18 128/78  11/30/18 128/78  08/21/18 132/81    Physical Exam Vitals signs and nursing note reviewed.  Constitutional:      General: She is not in acute distress.    Appearance: She is well-developed.  HENT:     Head: Normocephalic and atraumatic.  Neck:     Musculoskeletal: Normal range of motion and neck supple.     Vascular: No carotid bruit.  Cardiovascular:     Rate and Rhythm: Normal rate and regular rhythm.      Pulses:          Dorsalis pedis pulses are 1+ on the right side and 1+ on the left side.     Heart sounds: Normal heart sounds.  Pulmonary:     Effort: Pulmonary effort is normal. No respiratory distress.  Musculoskeletal:     Cervical back: She exhibits decreased range of motion and tenderness (right posterior scalp).     Right lower leg: Edema (trace) present.     Left lower leg: Edema (trace ) present.     Comments: Varicose veins bilaterally   Lymphadenopathy:     Cervical: No cervical adenopathy.  Skin:    General: Skin is warm and dry.     Capillary Refill: Capillary refill takes 2 to 3 seconds.     Findings: No rash.  Neurological:     Mental Status: She is alert and oriented to person, place, and time.  Psychiatric:        Behavior:  Behavior normal.        Thought Content: Thought content normal.     Wt Readings from Last 3 Encounters:  11/30/18 169 lb (76.7 kg)  11/30/18 169 lb (76.7 kg)  08/21/18 166 lb 6.4 oz (75.5 kg)    BP 128/78   Pulse 79   Ht 5\' 3"  (1.6 m)   Wt 169 lb (76.7 kg)   SpO2 96%   BMI 29.94 kg/m   Assessment and Plan: 1. Essential (primary) hypertension Will mild edema - will try reducing dose of amlodipine and monitoring BP Elevate when able Return if worsening - hydrochlorothiazide (MICROZIDE) 12.5 MG capsule; Take 1 capsule (12.5 mg total) by mouth daily as needed.  Dispense: 90 capsule; Refill: 1  2. Esophagitis, reflux Continue PPI - pantoprazole (PROTONIX) 40 MG tablet; Take 1 tablet (40 mg total) by mouth daily.  Dispense: 90 tablet; Refill: 1  3. Spondylosis of lumbar region without myelopathy or radiculopathy Followed by Ortho, wears a back support Recent ESI of little benefit   Partially dictated using Editor, commissioning. Any errors are unintentional.  Halina Maidens, MD [facility]  11/30/2018

## 2018-11-30 NOTE — Patient Instructions (Signed)
Crystal Zavala , Thank you for taking time to come for your Medicare Wellness Visit. I appreciate your ongoing commitment to your health goals. Please review the following plan we discussed and let me know if I can assist you in the future.   Screening recommendations/referrals: Colonoscopy: no longer required Mammogram: done 09/21/18 Bone Density: due - please let us know if you would like [place] to order this screening for you Recommended yearly ophthalmology/optometry visit for glaucoma screening and checkup Recommended yearly dental visit for hygiene and checkup  Vaccinations: Influenza vaccine: done 06/05/18 Pneumococcal vaccine: done 09/13/14 Tdap vaccine: done 08/27/11 Shingles vaccine: Shingrix discussed. Please contact your pharmacy for coverage information.   Advanced directives: Please bring a copy of your health care power of attorney and living will to the office at your convenience.  Conditions/risks identified: Recommend increasing physical activity as tolerated.   Next appointment: Please follow up in one year for your Medicare Annual Wellness visit.    Preventive Care 37 Years and Older, Female Preventive care refers to lifestyle choices and visits with your health care provider that can promote health and wellness. What does preventive care include?  A yearly physical exam. This is also called an annual well check.  Dental exams once or twice a year.  Routine eye exams. Ask your health care provider how often you should have your eyes checked.  Personal lifestyle choices, including:  Daily care of your teeth and gums.  Regular physical activity.  Eating a healthy diet.  Avoiding tobacco and drug use.  Limiting alcohol use.  Practicing safe sex.  Taking low-dose aspirin every day.  Taking vitamin and mineral supplements as recommended by your health care provider. What happens during an annual well check? The services and screenings done by your health care  provider during your annual well check will depend on your age, overall health, lifestyle risk factors, and family history of disease. Counseling  Your health care provider may ask you questions about your:  Alcohol use.  Tobacco use.  Drug use.  Emotional well-being.  Home and relationship well-being.  Sexual activity.  Eating habits.  History of falls.  Memory and ability to understand (cognition).  Work and work Statistician.  Reproductive health. Screening  You may have the following tests or measurements:  Height, weight, and BMI.  Blood pressure.  Lipid and cholesterol levels. These may be checked every 5 years, or more frequently if you are over 40 years old.  Skin check.  Lung cancer screening. You may have this screening every year starting at age 40 if you have a 30-pack-year history of smoking and currently smoke or have quit within the past 15 years.  Fecal occult blood test (FOBT) of the stool. You may have this test every year starting at age 13.  Flexible sigmoidoscopy or colonoscopy. You may have a sigmoidoscopy every 5 years or a colonoscopy every 10 years starting at age 1.  Hepatitis C blood test.  Hepatitis B blood test.  Sexually transmitted disease (STD) testing.  Diabetes screening. This is done by checking your blood sugar (glucose) after you have not eaten for a while (fasting). You may have this done every 1-3 years.  Bone density scan. This is done to screen for osteoporosis. You may have this done starting at age 54.  Mammogram. This may be done every 1-2 years. Talk to your health care provider about how often you should have regular mammograms. Talk with your health care provider about your test  results, treatment options, and if necessary, the need for more tests. Vaccines  Your health care provider may recommend certain vaccines, such as:  Influenza vaccine. This is recommended every year.  Tetanus, diphtheria, and acellular  pertussis (Tdap, Td) vaccine. You may need a Td booster every 10 years.  Zoster vaccine. You may need this after age 43.  Pneumococcal 13-valent conjugate (PCV13) vaccine. One dose is recommended after age 75.  Pneumococcal polysaccharide (PPSV23) vaccine. One dose is recommended after age 62. Talk to your health care provider about which screenings and vaccines you need and how often you need them. This information is not intended to replace advice given to you by your health care provider. Make sure you discuss any questions you have with your health care provider. Document Released: 08/18/2015 Document Revised: 04/10/2016 Document Reviewed: 05/23/2015 Elsevier Interactive Patient Education  2017 [employer] Prevention in the Home Falls can cause injuries. They can happen to people of all ages. There are many things you can do to make your home safe and to help prevent falls. What can I do on the outside of my home?  Regularly fix the edges of walkways and driveways and fix any cracks.  Remove anything that might make you trip as you walk through a door, such as a raised step or threshold.  Trim any bushes or trees on the path to your home.  Use bright outdoor lighting.  Clear any walking paths of anything that might make someone trip, such as rocks or tools.  Regularly check to see if handrails are loose or broken. Make sure that both sides of any steps have handrails.  Any raised decks and porches should have guardrails on the edges.  Have any leaves, snow, or ice cleared regularly.  Use sand or salt on walking paths during winter.  Clean up any spills in your garage right away. This includes oil or grease spills. What can I do in the bathroom?  Use night lights.  Install grab bars by the toilet and in the tub and shower. Do not use towel bars as grab bars.  Use non-skid mats or decals in the tub or shower.  If you need to sit down in the shower, use a plastic,  non-slip stool.  Keep the floor dry. Clean up any water that spills on the floor as soon as it happens.  Remove soap buildup in the tub or shower regularly.  Attach bath mats securely with double-sided non-slip rug tape.  Do not have throw rugs and other things on the floor that can make you trip. What can I do in the bedroom?  Use night lights.  Make sure that you have a light by your bed that is easy to reach.  Do not use any sheets or blankets that are too big for your bed. They should not hang down onto the floor.  Have a firm chair that has side arms. You can use this for support while you get dressed.  Do not have throw rugs and other things on the floor that can make you trip. What can I do in the kitchen?  Clean up any spills right away.  Avoid walking on wet floors.  Keep items that you use a lot in easy-to-reach places.  If you need to reach something above you, use a strong step stool that has a grab bar.  Keep electrical cords out of the way.  Do not use floor polish or wax that makes  floors slippery. If you must use wax, use non-skid floor wax.  Do not have throw rugs and other things on the floor that can make you trip. What can I do with my stairs?  Do not leave any items on the stairs.  Make sure that there are handrails on both sides of the stairs and use them. Fix handrails that are broken or loose. Make sure that handrails are as long as the stairways.  Check any carpeting to make sure that it is firmly attached to the stairs. Fix any carpet that is loose or worn.  Avoid having throw rugs at the top or bottom of the stairs. If you do have throw rugs, attach them to the floor with carpet tape.  Make sure that you have a light switch at the top of the stairs and the bottom of the stairs. If you do not have them, ask someone to add them for you. What else can I do to help prevent falls?  Wear shoes that:  Do not have high heels.  Have rubber  bottoms.  Are comfortable and fit you well.  Are closed at the toe. Do not wear sandals.  If you use a stepladder:  Make sure that it is fully opened. Do not climb a closed stepladder.  Make sure that both sides of the stepladder are locked into place.  Ask someone to hold it for you, if possible.  Clearly mark and make sure that you can see:  Any grab bars or handrails.  First and last steps.  Where the edge of each step is.  Use tools that help you move around (mobility aids) if they are needed. These include:  Canes.  Walkers.  Scooters.  Crutches.  Turn on the lights when you go into a dark area. Replace any light bulbs as soon as they burn out.  Set up your furniture so you have a clear path. Avoid moving your furniture around.  If any of your floors are uneven, fix them.  If there are any pets around you, be aware of where they are.  Review your medicines with your doctor. Some medicines can make you feel dizzy. This can increase your chance of falling. Ask your doctor what other things that you can do to help prevent falls. This information is not intended to replace advice given to you by your health care provider. Make sure you discuss any questions you have with your health care provider. Document Released: 05/18/2009 Document Revised: 12/28/2015 Document Reviewed: 08/26/2014 Elsevier Interactive Patient Education  2017 [employer].

## 2018-11-30 NOTE — Patient Instructions (Signed)
Cut Amlodipine in 1/2 - to equal 5 mg per day  Elevate legs  Check BP several times per week -

## 2018-11-30 NOTE — Progress Notes (Signed)
Subjective:   Crystal Zavala is a 85 y.o. female who presents for Medicare Annual (Subsequent) preventive examination.  Review of Systems:   Cardiac Risk Factors include: advanced age (>10men, >76 women);hypertension     Objective:     Vitals: BP 128/78 (BP Location: Left Arm, Patient Position: Sitting, Cuff Size: Normal)    Pulse 82    Temp 98.1 F (36.7 C) (Oral)    Resp 17    Ht 5\' 3"  (1.6 m)    Wt 169 lb (76.7 kg)    SpO2 99%    BMI 29.94 kg/m   Body mass index is 29.94 kg/m.  Advanced Directives 11/30/2018 10/23/2017 10/21/2016 09/12/2015  Does Patient Have a Medical Advance Directive? Yes Yes Yes No  Type of Paramedic of [place];Living will [place];Living will [place];Living will -  Copy of [place] in Chart? No - copy requested No - copy requested No - copy requested -  Would patient like information on creating a medical advance directive? - - - No - patient declined information    Tobacco Social History   Tobacco Use  Smoking Status Former Smoker   Packs/day: 0.50   Years: 39.00   Pack years: 19.50   Types: Cigarettes   Last attempt to quit: 1992   Years since quitting: 28.3  Smokeless Tobacco Never Used  Tobacco Comment   smoking cessation materails not required     Counseling given: Not Answered Comment: smoking cessation materails not required   Clinical Intake:  Pre-visit preparation completed: Yes  Pain : 0-10 Pain Score: 10-Worst pain ever Pain Type: Chronic pain Pain Location: Back Pain Orientation: Mid, Upper Pain Descriptors / Indicators: Aching, Discomfort Pain Onset: More than a month ago Pain Frequency: Constant     BMI - recorded: 29.94 Nutritional Status: BMI 25 -29 Overweight Nutritional Risks: None Diabetes: No  How often do you need to have someone help you when you read instructions, pamphlets, or other written materials from your doctor  or pharmacy?: 1 - Never  Interpreter Needed?: No  Information entered by :: Clemetine Marker LPN  Past Medical History:  Diagnosis Date   Allergy to tramadol 09/03/2018   Asthma    GERD (gastroesophageal reflux disease)    Hyperlipidemia    Hypertension    Osteoarthritis    in back   Scoliosis    Spondylosis    Thyroid disease    Past Surgical History:  Procedure Laterality Date   ABDOMINAL HYSTERECTOMY     BLEPHAROPLASTY Bilateral 2013   BREAST BIOPSY Left 2009   benign   COLONOSCOPY  2006   Normal   ESI  01/2017   L5-S1 @ [facility]   ESOPHAGOGASTRODUODENOSCOPY  2015   Dilation of esophageal stricture   TOTAL HIP ARTHROPLASTY Right 2019   Family History  Problem Relation Age of Onset   Tuberculosis Mother    Stroke Father    Bipolar disorder Daughter    Alzheimer's disease Brother    Heart attack Brother    Heart disease Brother    Bipolar disorder Sister    Diabetes Son    Heart attack Brother    Heart disease Brother    Heart attack Brother    Heart disease Brother    Social History   Socioeconomic History   Marital status: Widowed    Spouse name: Not on file   Number of children: 2   Years of education:  Not on file   Highest education level: 12th grade  Occupational History   Occupation: Retired  Scientist, product/process development strain: Not hard at [employer] insecurity:    Worry: Never true    Inability: Never true   Transportation needs:    Medical: No    Non-medical: No  Tobacco Use   Smoking status: Former Smoker    Packs/day: 0.50    Years: 39.00    Pack years: 19.50    Types: Cigarettes    Last attempt to quit: 1992    Years since quitting: 28.3   Smokeless tobacco: Never Used   Tobacco comment: smoking cessation materails not required  Substance and Sexual Activity   Alcohol use: No    Alcohol/week: 0.0 standard drinks   Drug use: No   Sexual activity: Never  Lifestyle   Physical activity:     Days per week: 0 days    Minutes per session: 0 min   Stress: Not at all  Relationships   Social connections:    Talks on phone: More than three times a week    Gets together: More than three times a week    Attends religious service: More than 4 times per year    Active member of club or organization: No    Attends meetings of clubs or organizations: Never    Relationship status: Widowed  Other Topics Concern   Not on file  Social History Narrative   Not on file    Outpatient Encounter Medications as of 11/30/2018  Medication Sig   acetaminophen (TYLENOL) 500 MG tablet Take 500 mg by mouth every 6 (six) hours as needed.    albuterol (PROAIR HFA) 108 (90 Base) MCG/ACT inhaler Inhale 2 puffs into the lungs 4 (four) times daily as needed.   amLODipine (NORVASC) 10 MG tablet Take 1 tablet (10 mg total) by mouth daily.   celecoxib (CELEBREX) 200 MG capsule Take 200 mg by mouth 2 (two) times daily. Pt taking once a day   cetirizine (ZYRTEC) 10 MG tablet Take 10 mg by mouth daily.   diazepam (VALIUM) 10 MG tablet TAKE 1 TABLET BY MOUTH EVERY NIGHT AT BEDTIME AS NEEDED   Fluticasone-Salmeterol (ADVAIR DISKUS) 250-50 MCG/DOSE AEPB Inhale 1 puff into the lungs 2 (two) times daily.   hydrochlorothiazide (MICROZIDE) 12.5 MG capsule Take 1 capsule (12.5 mg total) by mouth daily as needed.   levothyroxine (SYNTHROID, LEVOTHROID) 88 MCG tablet TAKE 1 TABLET DAILY BEFORE BREAKFAST   pantoprazole (PROTONIX) 40 MG tablet Take 1 tablet (40 mg total) by mouth daily.   No facility-administered encounter medications on file as of 11/30/2018.     Activities of Daily Living In your present state of health, do you have any difficulty performing the following activities: 11/30/2018  Hearing? N  Comment declines hearing aids  Vision? N  Difficulty concentrating or making decisions? N  Walking or climbing stairs? N  Dressing or bathing? N  Doing errands, shopping? N  Preparing Food and  eating ? N  Using the Toilet? N  In the past six months, have you accidently leaked urine? Y  Comment wears pads for protection, frequent urination at night  Do you have problems with loss of bowel control? N  Managing your Medications? N  Managing your Finances? N  Housekeeping or managing your Housekeeping? N  Some recent data might be hidden    Patient Care Team: Glean Hess, MD as PCP - General (  Internal Medicine) Tyler Pita, MD as Referring Physician (Physical Medicine and Rehabilitation) Bayard Hugger, MD as Consulting Physician (Neurosurgery) Loann Quill, MD as Referring Physician (Gastroenterology) Parke Simmers, NP as Nurse Practitioner (Pulmonary Disease)    Assessment:   This is a routine wellness examination for [place].  Exercise Activities and Dietary recommendations Current Exercise Habits: Home exercise routine, Type of exercise: walking, Intensity: Mild, Exercise limited by: orthopedic condition(s)  Goals     DIET - INCREASE WATER INTAKE     Recommend to drink at least 6-8 8oz glasses of water per day.     Increase physical activity     Pt would like to walk more as tolerated with back pain        Fall Risk Fall Risk  11/30/2018 08/21/2018 10/23/2017 10/22/2017 10/21/2016  Falls in the past year? 1 0 No No No  Number falls in past yr: 0 0 - - -  Injury with Fall? 0 0 - - -  Risk for fall due to : - - History of fall(s) - -  Risk for fall due to: Comment - - trips over dog - -  Follow up Falls prevention discussed Falls evaluation completed - - -   FALL RISK PREVENTION PERTAINING TO THE HOME:  Any stairs in or around the home? Yes  If so, do they handrails? Yes   Home free of loose throw rugs in walkways, pet beds, electrical cords, etc? Yes  Adequate lighting in your home to reduce risk of falls? Yes   ASSISTIVE DEVICES UTILIZED TO PREVENT FALLS:  Life alert? No  Use of a cane, walker or w/c? No  Grab bars in the bathroom? No   Shower chair or bench in shower? No  Elevated toilet seat or a handicapped toilet? Yes   DME ORDERS:  DME order needed?  No   TIMED UP AND GO:  Was the test performed? Yes .  Length of time to ambulate 10 feet: 6 sec.   GAIT:  Appearance of gait: Gait stead-fast and without the use of an assistive device.   Education: Fall risk prevention has been discussed.  Intervention(s) required? No   Depression Screen PHQ 2/9 Scores 11/30/2018 08/21/2018 10/23/2017 10/22/2017  PHQ - 2 Score 0 0 0 0  PHQ- 9 Score - - 0 -     Cognitive Function     6CIT Screen 11/30/2018 10/23/2017 10/21/2016  What Year? 0 points 0 points 0 points  What month? 0 points 0 points 0 points  What time? 0 points 0 points 0 points  Count back from 20 0 points 0 points 0 points  Months in reverse 0 points 0 points 0 points  Repeat phrase 0 points 0 points 2 points  Total Score 0 0 2    Immunization History  Administered Date(s) Administered   Influenza, High Dose Seasonal PF 04/22/2017   Influenza,inj,Quad PF,6+ Mos 04/22/2016   Influenza-Unspecified 06/05/2018   Pneumococcal Conjugate-13 09/13/2014   Pneumococcal Polysaccharide-23 10/03/2004   Tdap 08/27/2011    Qualifies for Shingles Vaccine? Yes . Due for Shingrix. Education has been provided regarding the importance of this vaccine. Pt has been advised to call insurance company to determine out of pocket expense. Advised may also receive vaccine at local pharmacy or Health Dept. Verbalized acceptance and understanding.  Tdap: Up to date  Flu Vaccine:Up to date  Pneumococcal Vaccine: Up to date    Screening Tests Health Maintenance  Topic Date Due   INFLUENZA  VACCINE  03/06/2019   MAMMOGRAM  09/22/2019   TETANUS/TDAP  08/26/2021   DEXA SCAN  Completed   PNA vac Low Risk Adult  Completed    Cancer Screenings:  Colorectal Screening: No longer required.   Mammogram: Completed 09/21/18. Repeat every year  Bone Density:  Declines screening.   Lung Cancer Screening: (Low Dose CT Chest recommended if Age 56-80 years, 30 pack-year currently smoking OR have quit w/in 15years.) does not qualify.   Additional Screening:  Hepatitis C Screening: no longer required   Vision Screening: Recommended annual ophthalmology exams for early detection of glaucoma and other disorders of the eye. Is the patient up to date with their annual eye exam?  No  Who is the provider or what is the name of the office in which the pt attends annual eye exams? [place]  Dental Screening: Recommended annual dental exams for proper oral hygiene  Community Resource Referral:  CRR required this visit?  No      Plan:     I have personally reviewed and addressed the Medicare Annual Wellness questionnaire and have noted the following in the patients chart:  A. Medical and social history B. Use of alcohol, tobacco or illicit drugs  C. Current medications and supplements D. Functional ability and status E.  Nutritional status F.  Physical activity G. Advance directives H. List of other physicians I.  Hospitalizations, surgeries, and ER visits in previous 12 months J.  [place] such as hearing and vision if needed, cognitive and depression L. Referrals and appointments   In addition, I have reviewed and discussed with patient certain preventive protocols, quality metrics, and best practice recommendations. A written personalized care plan for preventive services as well as general preventive health recommendations were provided to patient.   Signed,  Clemetine Marker, LPN Nurse Health Advisor   Nurse Notes: pt c/o shortness of breath when walking short distances and swelling in legs over the past 2 weeks. She also c/o burning sensation on the bottom of her feet and cramps in her legs from the knees down as well as fingers cramping. Pt scheduled to see Dr. Army Melia today.

## 2018-12-02 ENCOUNTER — Ambulatory Visit: Payer: Medicare Other

## 2019-01-14 DIAGNOSIS — G894 Chronic pain syndrome: Secondary | ICD-10-CM | POA: Diagnosis not present

## 2019-01-14 DIAGNOSIS — Z79899 Other long term (current) drug therapy: Secondary | ICD-10-CM | POA: Diagnosis not present

## 2019-01-14 DIAGNOSIS — Z5181 Encounter for therapeutic drug level monitoring: Secondary | ICD-10-CM | POA: Diagnosis not present

## 2019-01-26 ENCOUNTER — Telehealth: Payer: Self-pay

## 2019-01-26 NOTE — Telephone Encounter (Signed)
Patient called saying she was seen 2 months ago about swelling in ankles and feet. She was told previously to 1/2 amlodipine. It is not helping.  Spoke with Dr. Army Melia and informed pt to STOP Amlodipine and Start doubling on hctz. Two tablets daily. Keep follow up appt in July to see how she is doing.  She verbalized understanding of this.

## 2019-01-27 DIAGNOSIS — Z1283 Encounter for screening for malignant neoplasm of skin: Secondary | ICD-10-CM | POA: Diagnosis not present

## 2019-01-27 DIAGNOSIS — L570 Actinic keratosis: Secondary | ICD-10-CM | POA: Diagnosis not present

## 2019-01-27 DIAGNOSIS — L578 Other skin changes due to chronic exposure to nonionizing radiation: Secondary | ICD-10-CM | POA: Diagnosis not present

## 2019-01-27 DIAGNOSIS — D1801 Hemangioma of skin and subcutaneous tissue: Secondary | ICD-10-CM | POA: Diagnosis not present

## 2019-01-27 DIAGNOSIS — L821 Other seborrheic keratosis: Secondary | ICD-10-CM | POA: Diagnosis not present

## 2019-01-27 DIAGNOSIS — L820 Inflamed seborrheic keratosis: Secondary | ICD-10-CM | POA: Diagnosis not present

## 2019-01-27 DIAGNOSIS — L812 Freckles: Secondary | ICD-10-CM | POA: Diagnosis not present

## 2019-01-27 DIAGNOSIS — D225 Melanocytic nevi of trunk: Secondary | ICD-10-CM | POA: Diagnosis not present

## 2019-02-11 DIAGNOSIS — M5136 Other intervertebral disc degeneration, lumbar region: Secondary | ICD-10-CM | POA: Diagnosis not present

## 2019-02-11 DIAGNOSIS — Z79899 Other long term (current) drug therapy: Secondary | ICD-10-CM | POA: Diagnosis not present

## 2019-02-11 DIAGNOSIS — G894 Chronic pain syndrome: Secondary | ICD-10-CM | POA: Diagnosis not present

## 2019-02-11 DIAGNOSIS — M47896 Other spondylosis, lumbar region: Secondary | ICD-10-CM | POA: Diagnosis not present

## 2019-02-20 DIAGNOSIS — M47817 Spondylosis without myelopathy or radiculopathy, lumbosacral region: Secondary | ICD-10-CM | POA: Diagnosis not present

## 2019-02-22 DIAGNOSIS — R079 Chest pain, unspecified: Secondary | ICD-10-CM | POA: Diagnosis not present

## 2019-02-22 DIAGNOSIS — K219 Gastro-esophageal reflux disease without esophagitis: Secondary | ICD-10-CM | POA: Diagnosis not present

## 2019-02-22 DIAGNOSIS — Z87891 Personal history of nicotine dependence: Secondary | ICD-10-CM | POA: Diagnosis not present

## 2019-02-22 DIAGNOSIS — E7800 Pure hypercholesterolemia, unspecified: Secondary | ICD-10-CM | POA: Diagnosis not present

## 2019-02-22 DIAGNOSIS — Z7982 Long term (current) use of aspirin: Secondary | ICD-10-CM | POA: Diagnosis not present

## 2019-02-22 DIAGNOSIS — M1990 Unspecified osteoarthritis, unspecified site: Secondary | ICD-10-CM | POA: Diagnosis not present

## 2019-02-22 DIAGNOSIS — Z79899 Other long term (current) drug therapy: Secondary | ICD-10-CM | POA: Diagnosis not present

## 2019-02-22 DIAGNOSIS — R9431 Abnormal electrocardiogram [ECG] [EKG]: Secondary | ICD-10-CM | POA: Diagnosis not present

## 2019-02-22 DIAGNOSIS — Z885 Allergy status to narcotic agent status: Secondary | ICD-10-CM | POA: Diagnosis not present

## 2019-02-22 DIAGNOSIS — J45909 Unspecified asthma, uncomplicated: Secondary | ICD-10-CM | POA: Diagnosis not present

## 2019-02-22 DIAGNOSIS — I4891 Unspecified atrial fibrillation: Secondary | ICD-10-CM | POA: Diagnosis not present

## 2019-02-22 DIAGNOSIS — Z791 Long term (current) use of non-steroidal anti-inflammatories (NSAID): Secondary | ICD-10-CM | POA: Diagnosis not present

## 2019-02-22 DIAGNOSIS — E039 Hypothyroidism, unspecified: Secondary | ICD-10-CM | POA: Diagnosis not present

## 2019-02-22 DIAGNOSIS — Z20828 Contact with and (suspected) exposure to other viral communicable diseases: Secondary | ICD-10-CM | POA: Diagnosis not present

## 2019-02-22 DIAGNOSIS — Z888 Allergy status to other drugs, medicaments and biological substances status: Secondary | ICD-10-CM | POA: Diagnosis not present

## 2019-02-22 DIAGNOSIS — I10 Essential (primary) hypertension: Secondary | ICD-10-CM | POA: Diagnosis not present

## 2019-02-22 DIAGNOSIS — R110 Nausea: Secondary | ICD-10-CM | POA: Diagnosis not present

## 2019-02-22 DIAGNOSIS — R42 Dizziness and giddiness: Secondary | ICD-10-CM | POA: Diagnosis not present

## 2019-02-22 DIAGNOSIS — R0602 Shortness of breath: Secondary | ICD-10-CM | POA: Diagnosis not present

## 2019-02-22 DIAGNOSIS — Z8249 Family history of ischemic heart disease and other diseases of the circulatory system: Secondary | ICD-10-CM | POA: Diagnosis not present

## 2019-02-22 DIAGNOSIS — I480 Paroxysmal atrial fibrillation: Secondary | ICD-10-CM | POA: Diagnosis not present

## 2019-02-22 DIAGNOSIS — R000 Tachycardia, unspecified: Secondary | ICD-10-CM | POA: Diagnosis not present

## 2019-02-22 DIAGNOSIS — Z7951 Long term (current) use of inhaled steroids: Secondary | ICD-10-CM | POA: Diagnosis not present

## 2019-02-23 ENCOUNTER — Ambulatory Visit: Payer: Medicare Other | Admitting: Internal Medicine

## 2019-02-23 DIAGNOSIS — I4891 Unspecified atrial fibrillation: Secondary | ICD-10-CM | POA: Diagnosis not present

## 2019-02-23 DIAGNOSIS — I340 Nonrheumatic mitral (valve) insufficiency: Secondary | ICD-10-CM | POA: Diagnosis not present

## 2019-02-23 DIAGNOSIS — R0602 Shortness of breath: Secondary | ICD-10-CM | POA: Diagnosis not present

## 2019-02-23 DIAGNOSIS — Z8709 Personal history of other diseases of the respiratory system: Secondary | ICD-10-CM | POA: Diagnosis not present

## 2019-02-23 DIAGNOSIS — R110 Nausea: Secondary | ICD-10-CM | POA: Diagnosis not present

## 2019-02-23 DIAGNOSIS — I351 Nonrheumatic aortic (valve) insufficiency: Secondary | ICD-10-CM | POA: Diagnosis not present

## 2019-02-23 DIAGNOSIS — I361 Nonrheumatic tricuspid (valve) insufficiency: Secondary | ICD-10-CM | POA: Diagnosis not present

## 2019-02-23 DIAGNOSIS — I480 Paroxysmal atrial fibrillation: Secondary | ICD-10-CM | POA: Insufficient documentation

## 2019-02-23 DIAGNOSIS — R079 Chest pain, unspecified: Secondary | ICD-10-CM | POA: Diagnosis not present

## 2019-02-24 MED ORDER — BUDESONIDE-FORMOTEROL FUMARATE 160-4.5 MCG/ACT IN AERO
2.00 | INHALATION_SPRAY | RESPIRATORY_TRACT | Status: DC
Start: 2019-02-23 — End: 2019-02-24

## 2019-02-24 MED ORDER — HYDROCHLOROTHIAZIDE 25 MG PO TABS
12.50 | ORAL_TABLET | ORAL | Status: DC
Start: 2019-02-24 — End: 2019-02-24

## 2019-02-24 MED ORDER — LORATADINE 10 MG PO TABS
10.00 | ORAL_TABLET | ORAL | Status: DC
Start: 2019-02-24 — End: 2019-02-24

## 2019-02-24 MED ORDER — ASPIRIN 81 MG PO CHEW
81.00 | CHEWABLE_TABLET | ORAL | Status: DC
Start: 2019-02-24 — End: 2019-02-24

## 2019-02-24 MED ORDER — ATORVASTATIN CALCIUM 40 MG PO TABS
40.00 | ORAL_TABLET | ORAL | Status: DC
Start: 2019-02-24 — End: 2019-02-24

## 2019-02-24 MED ORDER — PANTOPRAZOLE SODIUM 40 MG PO TBEC
40.00 | DELAYED_RELEASE_TABLET | ORAL | Status: DC
Start: 2019-02-24 — End: 2019-02-24

## 2019-02-24 MED ORDER — ALBUTEROL SULFATE HFA 108 (90 BASE) MCG/ACT IN AERS
2.00 | INHALATION_SPRAY | RESPIRATORY_TRACT | Status: DC
Start: ? — End: 2019-02-24

## 2019-02-24 MED ORDER — HEPARIN SODIUM (PORCINE) 10000 UNIT/ML IJ SOLN
5000.00 | INTRAMUSCULAR | Status: DC
Start: 2019-02-24 — End: 2019-02-24

## 2019-02-24 MED ORDER — LIDOCAINE HCL (PF) 1 % IJ SOLN
0.50 | INTRAMUSCULAR | Status: DC
Start: ? — End: 2019-02-24

## 2019-02-24 MED ORDER — LEVOTHYROXINE SODIUM 75 MCG PO TABS
75.00 | ORAL_TABLET | ORAL | Status: DC
Start: 2019-02-24 — End: 2019-02-24

## 2019-02-25 ENCOUNTER — Other Ambulatory Visit: Payer: Self-pay

## 2019-02-25 ENCOUNTER — Other Ambulatory Visit: Payer: Self-pay | Admitting: Internal Medicine

## 2019-02-25 ENCOUNTER — Telehealth: Payer: Self-pay

## 2019-02-25 DIAGNOSIS — I10 Essential (primary) hypertension: Secondary | ICD-10-CM

## 2019-02-25 DIAGNOSIS — E034 Atrophy of thyroid (acquired): Secondary | ICD-10-CM

## 2019-02-25 MED ORDER — HYDROCHLOROTHIAZIDE 25 MG PO TABS: 25.0000 mg | ORAL_TABLET | Freq: Every day | ORAL | 3 refills | Status: DC

## 2019-02-25 MED ORDER — METOPROLOL SUCCINATE ER 25 MG PO TB24: 25.0000 mg | ORAL_TABLET | Freq: Every day | ORAL | 3 refills | Status: DC

## 2019-02-25 MED ORDER — LEVOTHYROXINE SODIUM 75 MCG PO TABS: 75.0000 ug | ORAL_TABLET | Freq: Every day | ORAL | 3 refills | Status: DC

## 2019-02-25 NOTE — Telephone Encounter (Signed)
I sent in the new strength on HCTZ - it is now 25 mg in one tablet. I also sent in the new strength of Levothyroxine and the new BP med - Toprol XL 25 mg. All Rxs sent to mail order since not otherwise indicated in the note.

## 2019-02-25 NOTE — Telephone Encounter (Signed)
Patient called with questions about meds. You stopped her amlodipine due to swelling and doubled up on HCTZ. Should she continue taking it with doubling dose. If so, she is out and needs RF. Also, needs RF on pantoprazole if she should continue this medication as well.  Please Advise.

## 2019-03-01 NOTE — Telephone Encounter (Signed)
Called and spoke with pt. Informed of this.

## 2019-03-17 DIAGNOSIS — M5116 Intervertebral disc disorders with radiculopathy, lumbar region: Secondary | ICD-10-CM | POA: Diagnosis not present

## 2019-03-17 DIAGNOSIS — M5416 Radiculopathy, lumbar region: Secondary | ICD-10-CM | POA: Diagnosis not present

## 2019-03-17 DIAGNOSIS — M48061 Spinal stenosis, lumbar region without neurogenic claudication: Secondary | ICD-10-CM | POA: Diagnosis not present

## 2019-03-22 DIAGNOSIS — I4891 Unspecified atrial fibrillation: Secondary | ICD-10-CM | POA: Diagnosis not present

## 2019-03-23 DIAGNOSIS — R0789 Other chest pain: Secondary | ICD-10-CM | POA: Diagnosis not present

## 2019-03-23 DIAGNOSIS — I480 Paroxysmal atrial fibrillation: Secondary | ICD-10-CM | POA: Diagnosis not present

## 2019-03-23 DIAGNOSIS — I4891 Unspecified atrial fibrillation: Secondary | ICD-10-CM | POA: Diagnosis not present

## 2019-03-24 DIAGNOSIS — Z79899 Other long term (current) drug therapy: Secondary | ICD-10-CM | POA: Diagnosis not present

## 2019-03-24 DIAGNOSIS — M47896 Other spondylosis, lumbar region: Secondary | ICD-10-CM | POA: Diagnosis not present

## 2019-03-24 DIAGNOSIS — G894 Chronic pain syndrome: Secondary | ICD-10-CM | POA: Diagnosis not present

## 2019-03-24 DIAGNOSIS — M5136 Other intervertebral disc degeneration, lumbar region: Secondary | ICD-10-CM | POA: Diagnosis not present

## 2019-03-24 DIAGNOSIS — M545 Low back pain: Secondary | ICD-10-CM | POA: Diagnosis not present

## 2019-03-24 DIAGNOSIS — Z86018 Personal history of other benign neoplasm: Secondary | ICD-10-CM | POA: Diagnosis not present

## 2019-03-24 DIAGNOSIS — M169 Osteoarthritis of hip, unspecified: Secondary | ICD-10-CM | POA: Diagnosis not present

## 2019-03-31 ENCOUNTER — Other Ambulatory Visit: Payer: Self-pay

## 2019-03-31 ENCOUNTER — Ambulatory Visit (INDEPENDENT_AMBULATORY_CARE_PROVIDER_SITE_OTHER): Payer: Medicare Other | Admitting: Internal Medicine

## 2019-03-31 ENCOUNTER — Encounter: Payer: Self-pay | Admitting: Internal Medicine

## 2019-03-31 VITALS — BP 118/82 | HR 94 | Ht 63.0 in | Wt 165.0 lb

## 2019-03-31 DIAGNOSIS — Z23 Encounter for immunization: Secondary | ICD-10-CM

## 2019-03-31 DIAGNOSIS — E034 Atrophy of thyroid (acquired): Secondary | ICD-10-CM | POA: Diagnosis not present

## 2019-03-31 DIAGNOSIS — K2100 Gastro-esophageal reflux disease with esophagitis, without bleeding: Secondary | ICD-10-CM

## 2019-03-31 DIAGNOSIS — I10 Essential (primary) hypertension: Secondary | ICD-10-CM

## 2019-03-31 DIAGNOSIS — I4891 Unspecified atrial fibrillation: Secondary | ICD-10-CM

## 2019-03-31 MED ORDER — PANTOPRAZOLE SODIUM 40 MG PO TBEC: 40.0000 mg | DELAYED_RELEASE_TABLET | Freq: Every day | ORAL | 3 refills | Status: DC

## 2019-03-31 NOTE — Progress Notes (Signed)
Date:  03/31/2019   Name:  Crystal Zavala   DOB:  1932/07/09   MRN:  [id number]   Chief Complaint: Hypothyroidism (TSH Panel.), Gastroesophageal Reflux (RF pantoprazole.), and Hyperlipidemia (Patient stopped taking atorvastatin a week ago- seen cardiologist and she was having upset stomach, and nausea after taking it. Was told to stop for 30 days and if better than we know that is cause. She said she is already starting to feel better.)  Thyroid Problem Presents for follow-up visit. Symptoms include palpitations (one episode of Afib). Patient reports no anxiety, constipation, diaphoresis, diarrhea, hoarse voice, leg swelling or weight gain. The symptoms have been improving.  Hypertension This is a chronic problem. The problem is controlled. Associated symptoms include palpitations (one episode of Afib). Pertinent negatives include no chest pain, headaches or shortness of breath. Past treatments include beta blockers and diuretics (stopped amlodipine due to edema which has resolved). The current treatment provides significant improvement. Identifiable causes of hypertension include a thyroid problem.  Transient Afib - pt had chest pain last month, called EMS and was in Afib with RVR, resolved with IV cardiazem.  She was admitted and monitored overnight.  No further Afib.  Event monitor was placed.  She was seen by cardiology as an outpt and monitor showed no recurrent Afib.  While in hospital, her synthroid dose was decreased and she was started on lipitor and metoprolol but no anticoagulation. On cardiology follow up she complained of not feeling well, thought to be due to lipitor.  She was asked to stop this and monitor sx.  Eliquis was recommended but pt declined at that time. She does have a CHADs score of 4 and hx of remote TIA.  She is taking aspirin 81 mg intermittently.  Review of Systems  Constitutional: Negative for diaphoresis, unexpected weight change and weight gain.  HENT: Negative  for hoarse voice.   Respiratory: Negative for cough, chest tightness, shortness of breath and wheezing.   Cardiovascular: Positive for palpitations (one episode of Afib). Negative for chest pain and leg swelling.  Gastrointestinal: Negative for abdominal pain, blood in stool, constipation, diarrhea, nausea and vomiting.  Allergic/Immunologic: Negative for environmental allergies.  Neurological: Negative for dizziness, light-headedness and headaches.  Hematological: Negative for adenopathy.  Psychiatric/Behavioral: Negative for sleep disturbance. The patient is not nervous/anxious.     Patient Active Problem List   Diagnosis Date Noted  . Atrial fibrillation, new onset ([place]) 02/23/2019  . Allergy to tramadol 09/03/2018  . Pain of both hip joints 04/22/2017  . Renal insufficiency 01/05/2017  . 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 Spondylosis of lumbar region without myelopathy or radiculopathy 10/21/2016  . Hypothyroidism due to acquired atrophy of thyroid 04/22/2016  . Insomnia 04/22/2016  . DDD (degenerative disc disease), lumbar 04/22/2016  . Breast mass, right 01/10/2016  . Aortic atherosclerosis ([place]) 01/18/2015  . Arthritis of neck 01/18/2015  . Dermatitis, eczematoid 01/18/2015  . Essential (primary) hypertension 01/18/2015  . Esophagitis, reflux 01/18/2015  . H/O adenomatous polyp of colon 01/18/2015  . Migraine without aura and responsive to treatment 01/18/2015  . Asthma, mild intermittent 01/18/2015  . Hyperlipidemia, mild 01/18/2015  . Lung nodule, multiple 01/18/2015  . Kidney cysts 01/18/2015  . Tobacco use disorder, moderate, in sustained remission 01/18/2015  . Temporary cerebral vascular dysfunction 01/18/2015    Allergies  Allergen Reactions  . Amlodipine Swelling  . Tramadol Nausea Only and Rash  . Ace Inhibitors Cough    Past  Surgical History:  Procedure Laterality Date  . ABDOMINAL HYSTERECTOMY    . BLEPHAROPLASTY  Bilateral 2013  . BREAST BIOPSY Left 2009   benign  . COLONOSCOPY  2006   Normal  . ESI  01/2017   L5-S1 @ [facility]  . ESOPHAGOGASTRODUODENOSCOPY  2015   Dilation of esophageal stricture  . TOTAL HIP ARTHROPLASTY Right 2019    Social History   Tobacco Use  . Smoking status: Former Smoker    Packs/day: 0.50    Years: 39.00    Pack years: 19.50    Types: Cigarettes    Quit date: 1992    Years since quitting: 28.6  . Smokeless tobacco: Never Used  . Tobacco comment: smoking cessation materails not required  Substance Use Topics  . Alcohol use: No    Alcohol/week: 0.0 standard drinks  . Drug use: No     Medication list has been reviewed and updated.  Current Meds  Medication Sig  . albuterol (PROAIR HFA) 108 (90 Base) MCG/ACT inhaler Inhale 2 puffs into the lungs 4 (four) times daily as needed.  . celecoxib (CELEBREX) 200 MG capsule Take 200 mg by mouth daily. Pt taking once a day  . cetirizine (ZYRTEC) 10 MG tablet Take 10 mg by mouth daily.  . Fluticasone-Salmeterol (ADVAIR DISKUS) 250-50 MCG/DOSE AEPB Inhale 1 puff into the lungs 2 (two) times daily.  . hydrochlorothiazide (HYDRODIURIL) 25 MG tablet Take 1 tablet (25 mg total) by mouth daily.  Marland Kitchen HYDROcodone-acetaminophen (NORCO/VICODIN) 5-325 MG tablet Take 1 tablet by mouth every 6 (six) hours as needed for moderate pain.  Marland Kitchen levothyroxine (SYNTHROID) 75 MCG tablet Take 1 tablet (75 mcg total) by mouth daily.  . Lidocaine HCl 4 % LIQD Apply topically.  . metoprolol succinate (TOPROL-XL) 25 MG 24 hr tablet Take 1 tablet (25 mg total) by mouth daily.  . pantoprazole (PROTONIX) 40 MG tablet Take 1 tablet (40 mg total) by mouth daily.  Marland Kitchen tiZANidine (ZANAFLEX) 4 MG tablet Take 1 tablet by mouth as needed.    PHQ 2/9 Scores 03/31/2019 11/30/2018 08/21/2018 10/23/2017  PHQ - 2 Score 2 0 0 0  PHQ- 9 Score 5 - - 0    BP Readings from Last 3 Encounters:  03/31/19 118/82  11/30/18 128/78  11/30/18 128/78    Physical Exam  Vitals signs and nursing note reviewed.  Constitutional:      General: She is not in acute distress.    Appearance: She is well-developed.  HENT:     Head: Normocephalic and atraumatic.  Neck:     Musculoskeletal: Normal range of motion and neck supple.  Cardiovascular:     Rate and Rhythm: Normal rate and regular rhythm.  No extrasystoles are present.    Pulses: Normal pulses.     Heart sounds: No murmur.  Pulmonary:     Effort: Pulmonary effort is normal. No respiratory distress.     Breath sounds: No wheezing or rhonchi.  Musculoskeletal: Normal range of motion.     Right lower leg: No edema.     Left lower leg: No edema.  Lymphadenopathy:     Cervical: No cervical adenopathy.  Skin:    General: Skin is warm and dry.     Capillary Refill: Capillary refill takes less than 2 seconds.     Findings: No rash.  Neurological:     General: No focal deficit present.     Mental Status: She is alert and oriented to person, place, and time.  Psychiatric:  Behavior: Behavior normal.        Thought Content: Thought content normal.     Wt Readings from Last 3 Encounters:  03/31/19 165 lb (74.8 kg)  11/30/18 169 lb (76.7 kg)  11/30/18 169 lb (76.7 kg)    BP 118/82 (BP Location: Left Wrist, Patient Position: Sitting, Cuff Size: Normal)   Pulse 94   Ht 5\' 3"  (1.6 m)   Wt 165 lb (74.8 kg)   SpO2 97%   BMI 29.23 kg/m   Assessment and Plan: 1. Hypothyroidism due to acquired atrophy of thyroid Dose recently changed due to single episode of Afib She has noticed no problems such as edema, constipation, weight gain or hair loss Continue current dose of 75 mcg daily - TSH + free T4  2. Essential (primary) hypertension Clinically stable exam with well controlled BP.   Tolerating medications, metoprolol 25 mg and HCTZ 25 mg without side effects at this time. Pt to continue current regimen and low sodium diet; benefits of regular exercise as able discussed.  3. Atrial  fibrillation, unspecified type (HCC) Take aspirin 81 mg daily No s/s to suggest recurrent episodes Discussed pros and cons of Eliquis - pt wishes to not take it at this time and will discuss with Cardiology at next appt.  4. Esophagitis, reflux Symptoms well controlled on daily PPI No red flag signs such as weight loss, n/v, melena Will continue pantoprazole 40 mg daily. - pantoprazole (PROTONIX) 40 MG tablet; Take 1 tablet (40 mg total) by mouth daily.  Dispense: 90 tablet; Refill: 3  5. Need for influenza vaccination - Flu Vaccine QUAD High Dose(Fluad)   Partially dictated using Editor, commissioning. Any errors are unintentional.  Halina Maidens, MD [facility]  03/31/2019

## 2019-04-01 LAB — TSH+FREE T4
Free T4: 1.61 ng/dL (ref 0.82–1.77)
TSH: 4.27 u[IU]/mL (ref 0.450–4.500)

## 2019-04-07 DIAGNOSIS — Z96641 Presence of right artificial hip joint: Secondary | ICD-10-CM | POA: Diagnosis not present

## 2019-04-27 DIAGNOSIS — M47817 Spondylosis without myelopathy or radiculopathy, lumbosacral region: Secondary | ICD-10-CM | POA: Diagnosis not present

## 2019-04-27 DIAGNOSIS — D3610 Benign neoplasm of peripheral nerves and autonomic nervous system, unspecified: Secondary | ICD-10-CM | POA: Diagnosis not present

## 2019-04-27 DIAGNOSIS — M5440 Lumbago with sciatica, unspecified side: Secondary | ICD-10-CM | POA: Diagnosis not present

## 2019-05-17 DIAGNOSIS — M48061 Spinal stenosis, lumbar region without neurogenic claudication: Secondary | ICD-10-CM | POA: Diagnosis not present

## 2019-05-17 DIAGNOSIS — M47816 Spondylosis without myelopathy or radiculopathy, lumbar region: Secondary | ICD-10-CM | POA: Diagnosis not present

## 2019-06-03 DIAGNOSIS — M47817 Spondylosis without myelopathy or radiculopathy, lumbosacral region: Secondary | ICD-10-CM | POA: Diagnosis not present

## 2019-06-15 ENCOUNTER — Other Ambulatory Visit: Payer: Self-pay | Admitting: Internal Medicine

## 2019-06-21 ENCOUNTER — Telehealth: Payer: Self-pay

## 2019-06-21 ENCOUNTER — Other Ambulatory Visit: Payer: Self-pay | Admitting: Internal Medicine

## 2019-06-21 NOTE — Telephone Encounter (Signed)
It is fine to wait until January to get more - it was stopped since she only had #30 starting last December.

## 2019-06-21 NOTE — Telephone Encounter (Signed)
Refill;s declined by PCP for Diazepam. Patient would like to try to wait even though her sleep is awful without meds. She does not feel she needs another appt when January she will come in. I advised her we need to document a visit every few months when on controlled meds.

## 2019-06-23 DIAGNOSIS — I10 Essential (primary) hypertension: Secondary | ICD-10-CM | POA: Diagnosis not present

## 2019-06-23 DIAGNOSIS — I480 Paroxysmal atrial fibrillation: Secondary | ICD-10-CM | POA: Diagnosis not present

## 2019-06-24 DIAGNOSIS — J4530 Mild persistent asthma, uncomplicated: Secondary | ICD-10-CM | POA: Diagnosis not present

## 2019-06-24 DIAGNOSIS — G894 Chronic pain syndrome: Secondary | ICD-10-CM | POA: Diagnosis not present

## 2019-06-24 DIAGNOSIS — Z79899 Other long term (current) drug therapy: Secondary | ICD-10-CM | POA: Diagnosis not present

## 2019-06-24 DIAGNOSIS — M169 Osteoarthritis of hip, unspecified: Secondary | ICD-10-CM | POA: Diagnosis not present

## 2019-06-24 DIAGNOSIS — M47896 Other spondylosis, lumbar region: Secondary | ICD-10-CM | POA: Diagnosis not present

## 2019-06-24 DIAGNOSIS — M5136 Other intervertebral disc degeneration, lumbar region: Secondary | ICD-10-CM | POA: Diagnosis not present

## 2019-06-24 DIAGNOSIS — M545 Low back pain: Secondary | ICD-10-CM | POA: Diagnosis not present

## 2019-06-24 DIAGNOSIS — Z86018 Personal history of other benign neoplasm: Secondary | ICD-10-CM | POA: Diagnosis not present

## 2019-06-25 DIAGNOSIS — Z5181 Encounter for therapeutic drug level monitoring: Secondary | ICD-10-CM | POA: Diagnosis not present

## 2019-06-25 DIAGNOSIS — Z79899 Other long term (current) drug therapy: Secondary | ICD-10-CM | POA: Diagnosis not present

## 2019-07-01 IMAGING — CR DG HIP (WITH OR WITHOUT PELVIS) 2-3V*R*
3 series · 3 of 3 positions shown · non-contrast
Comparison: None.

CLINICAL DATA: Right hip pain, right lower back pain, no history of
trauma

EXAM:
DG HIP (WITH OR WITHOUT PELVIS) 2-3V RIGHT

[pelvis ap]
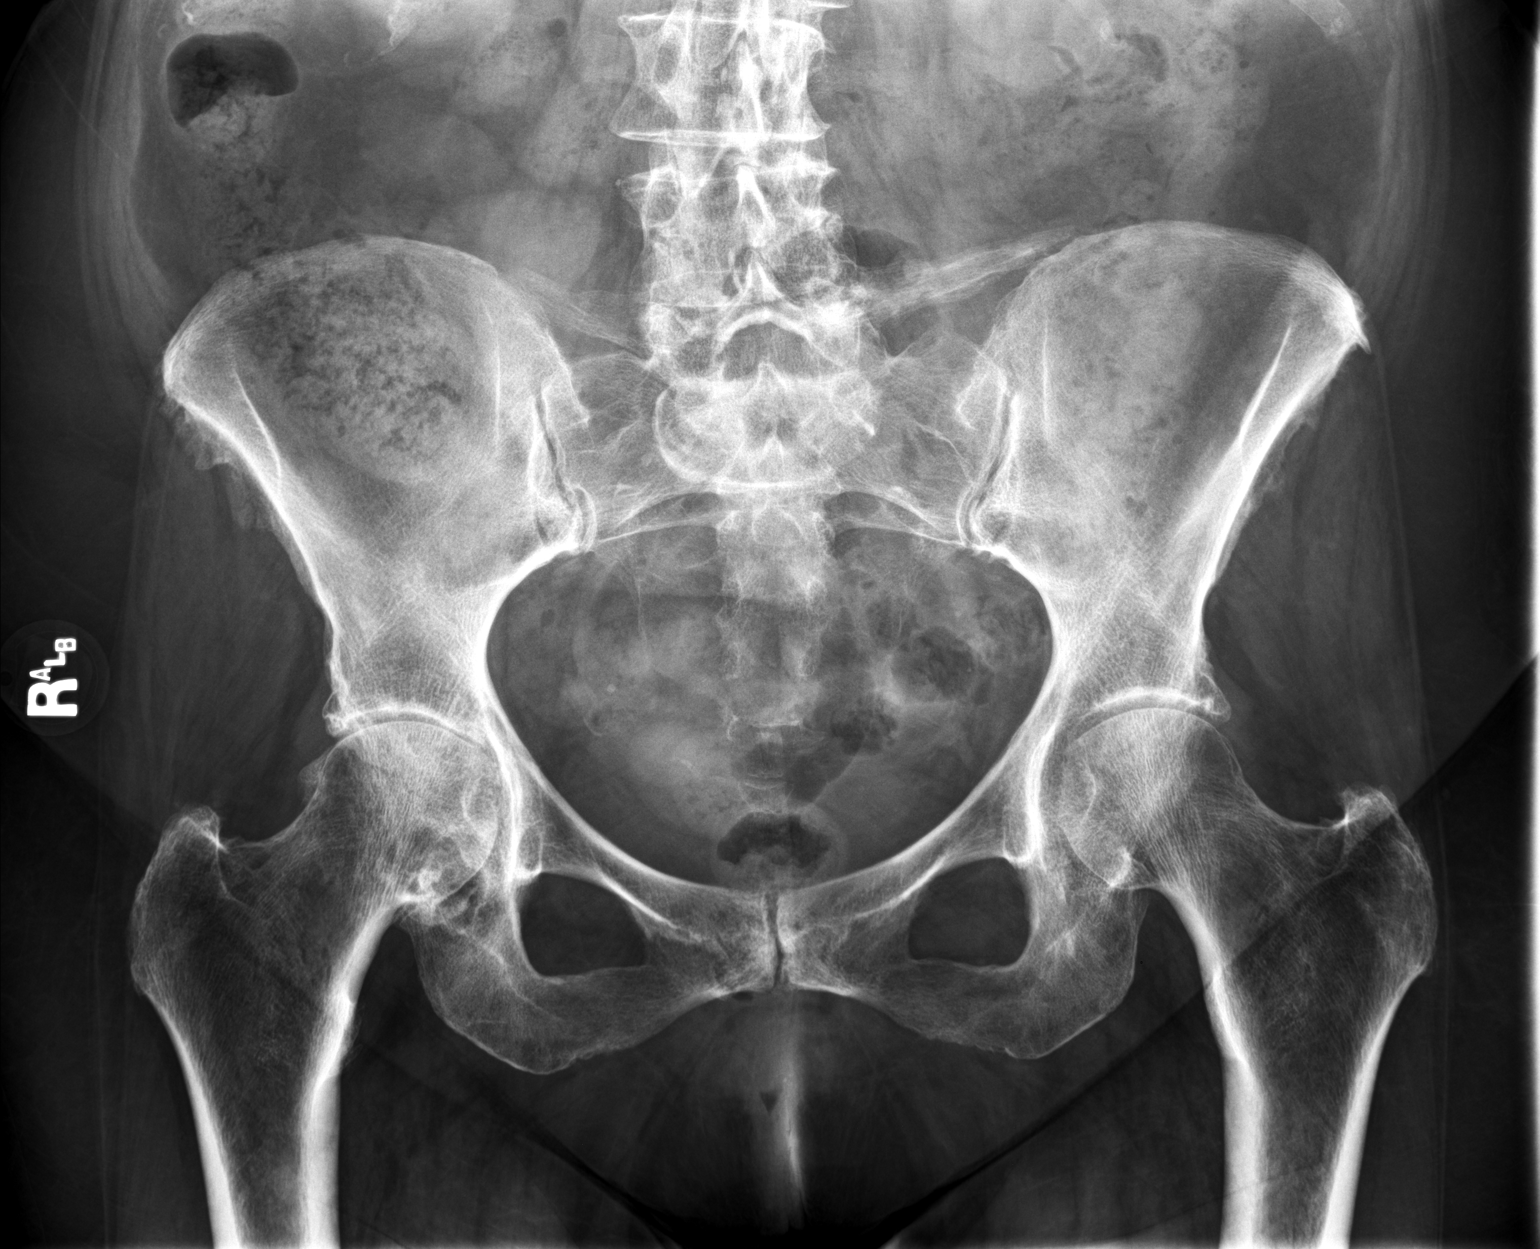

[hip ap]
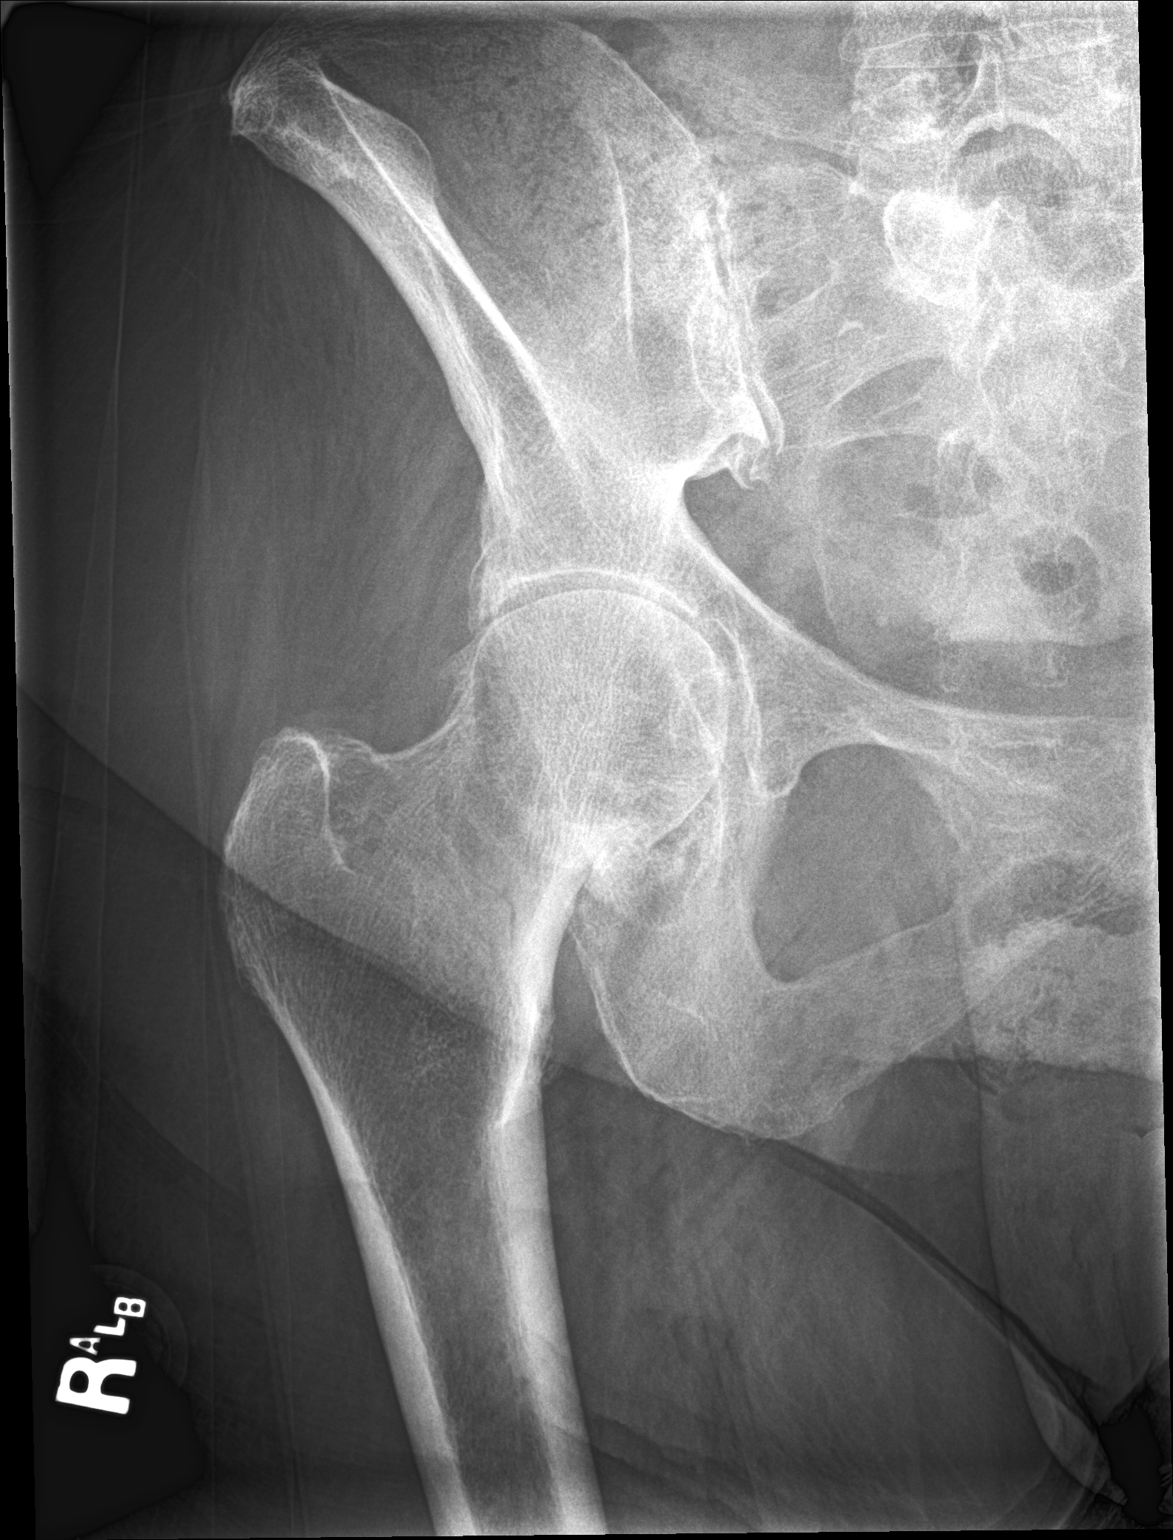

[hip lat]
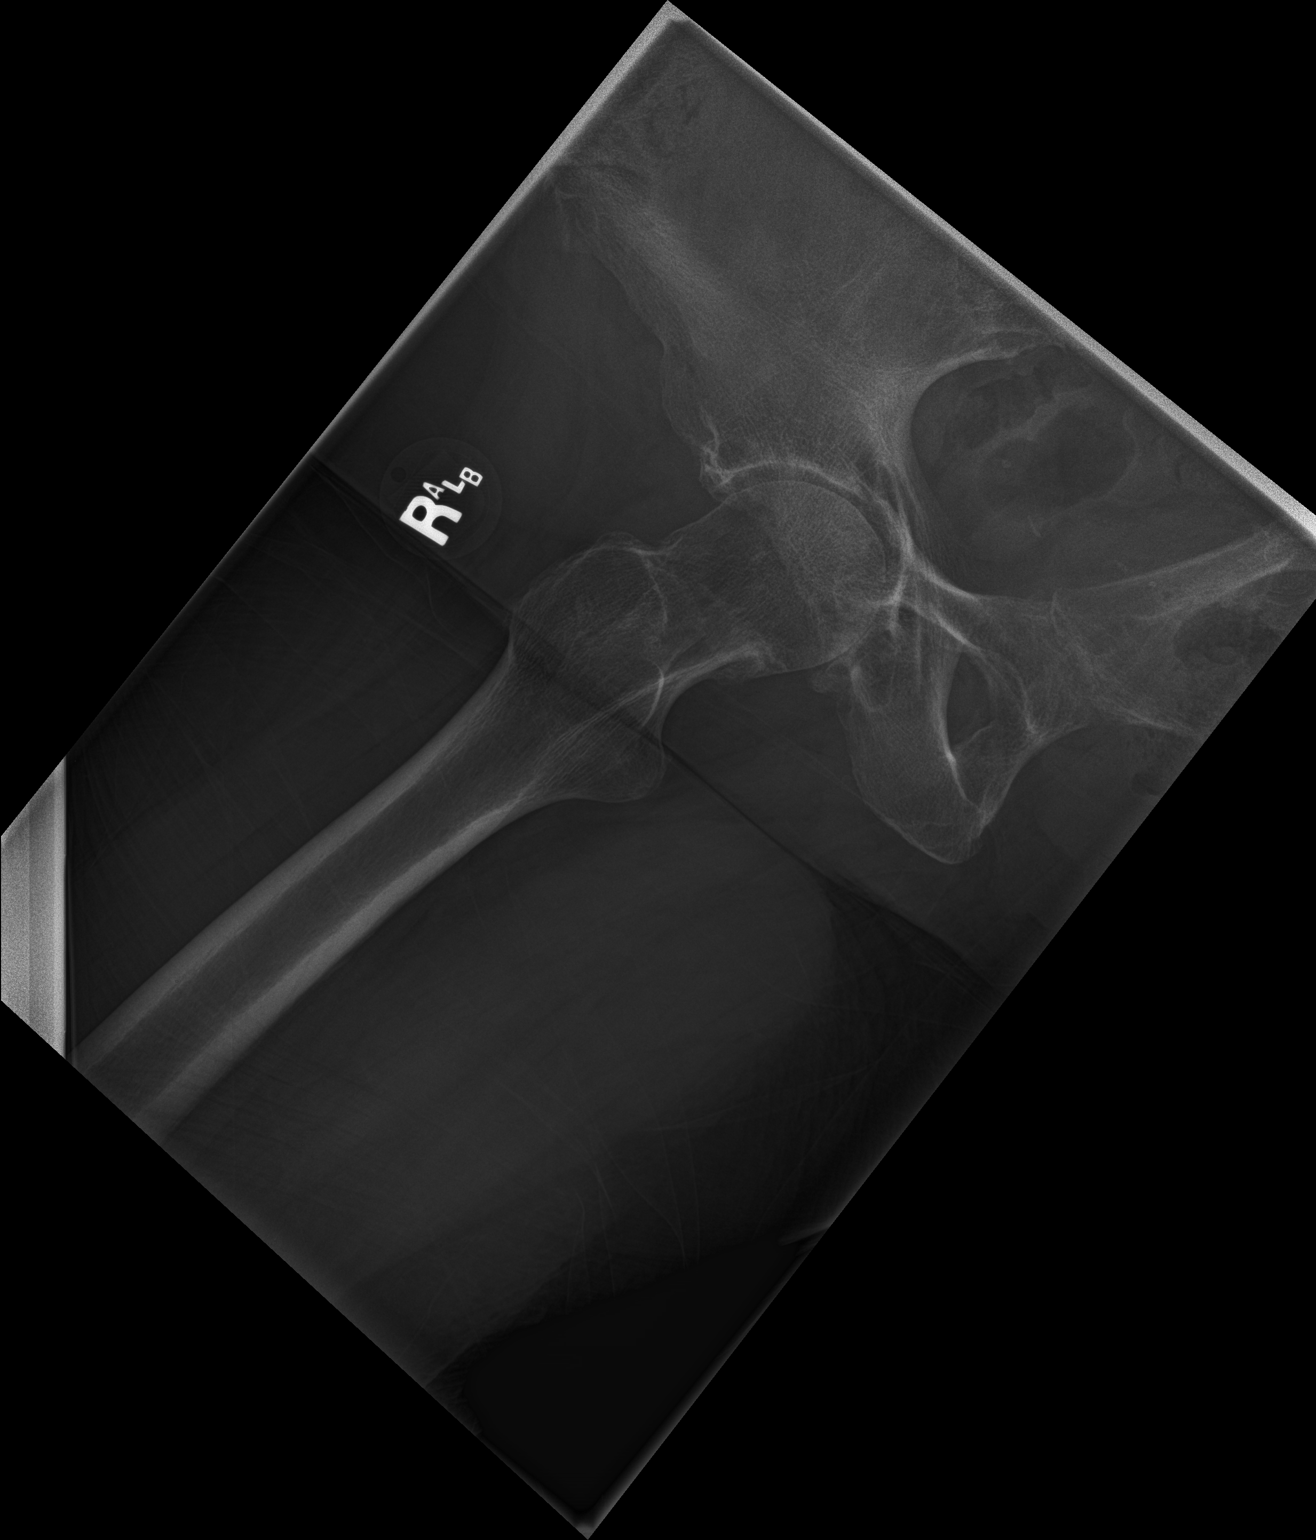

[3 of 3 positions shown; findings below may reference images not displayed]

FINDINGS: There is degenerative joint disease of both hips, right much greater
than left with more loss of joint space on the right with sclerosis
and spurring present. No acute fracture is seen. The pelvic rami are
intact. The SI joints are corticated.
IMPRESSION: Degenerative joint disease of the hips, right more advanced than
left. No acute abnormality.

## 2019-07-01 IMAGING — CR DG HIP (WITH OR WITHOUT PELVIS) 2-3V*L*
2 series · 2 of 2 positions shown · non-contrast
Comparison: No recent prior .

CLINICAL DATA: Hip pain.  Back pain.

EXAM:
DG HIP (WITH OR WITHOUT PELVIS) 2-3V LEFT

[hip ap]
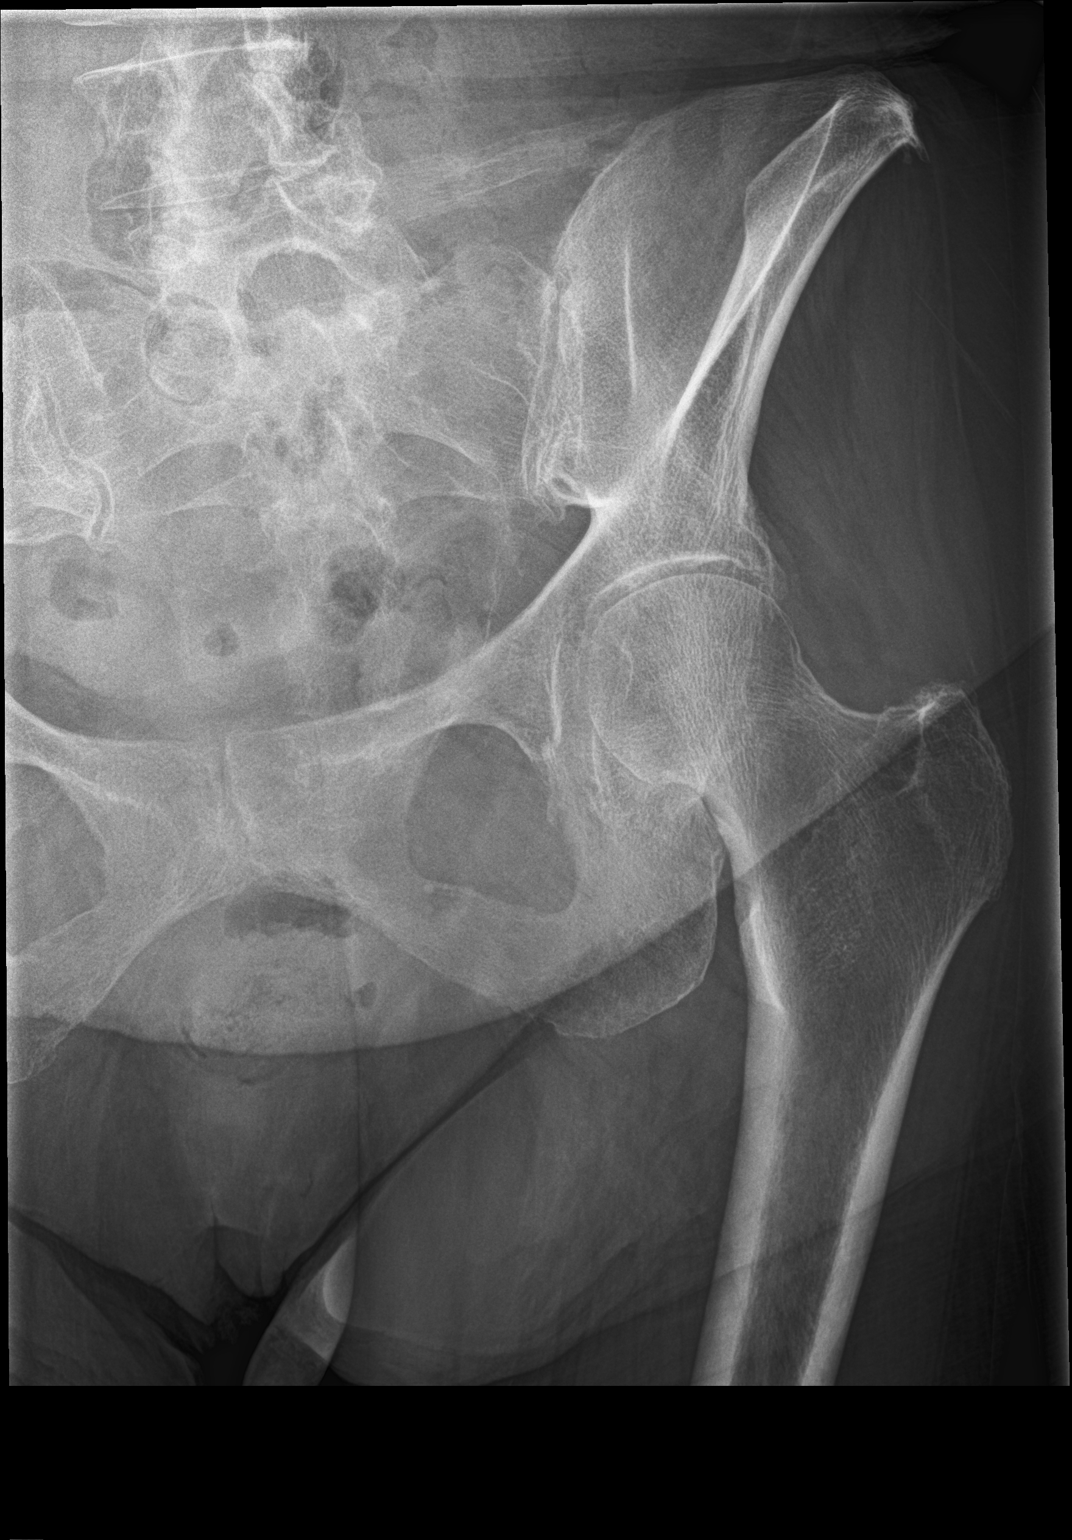

[hip lat]
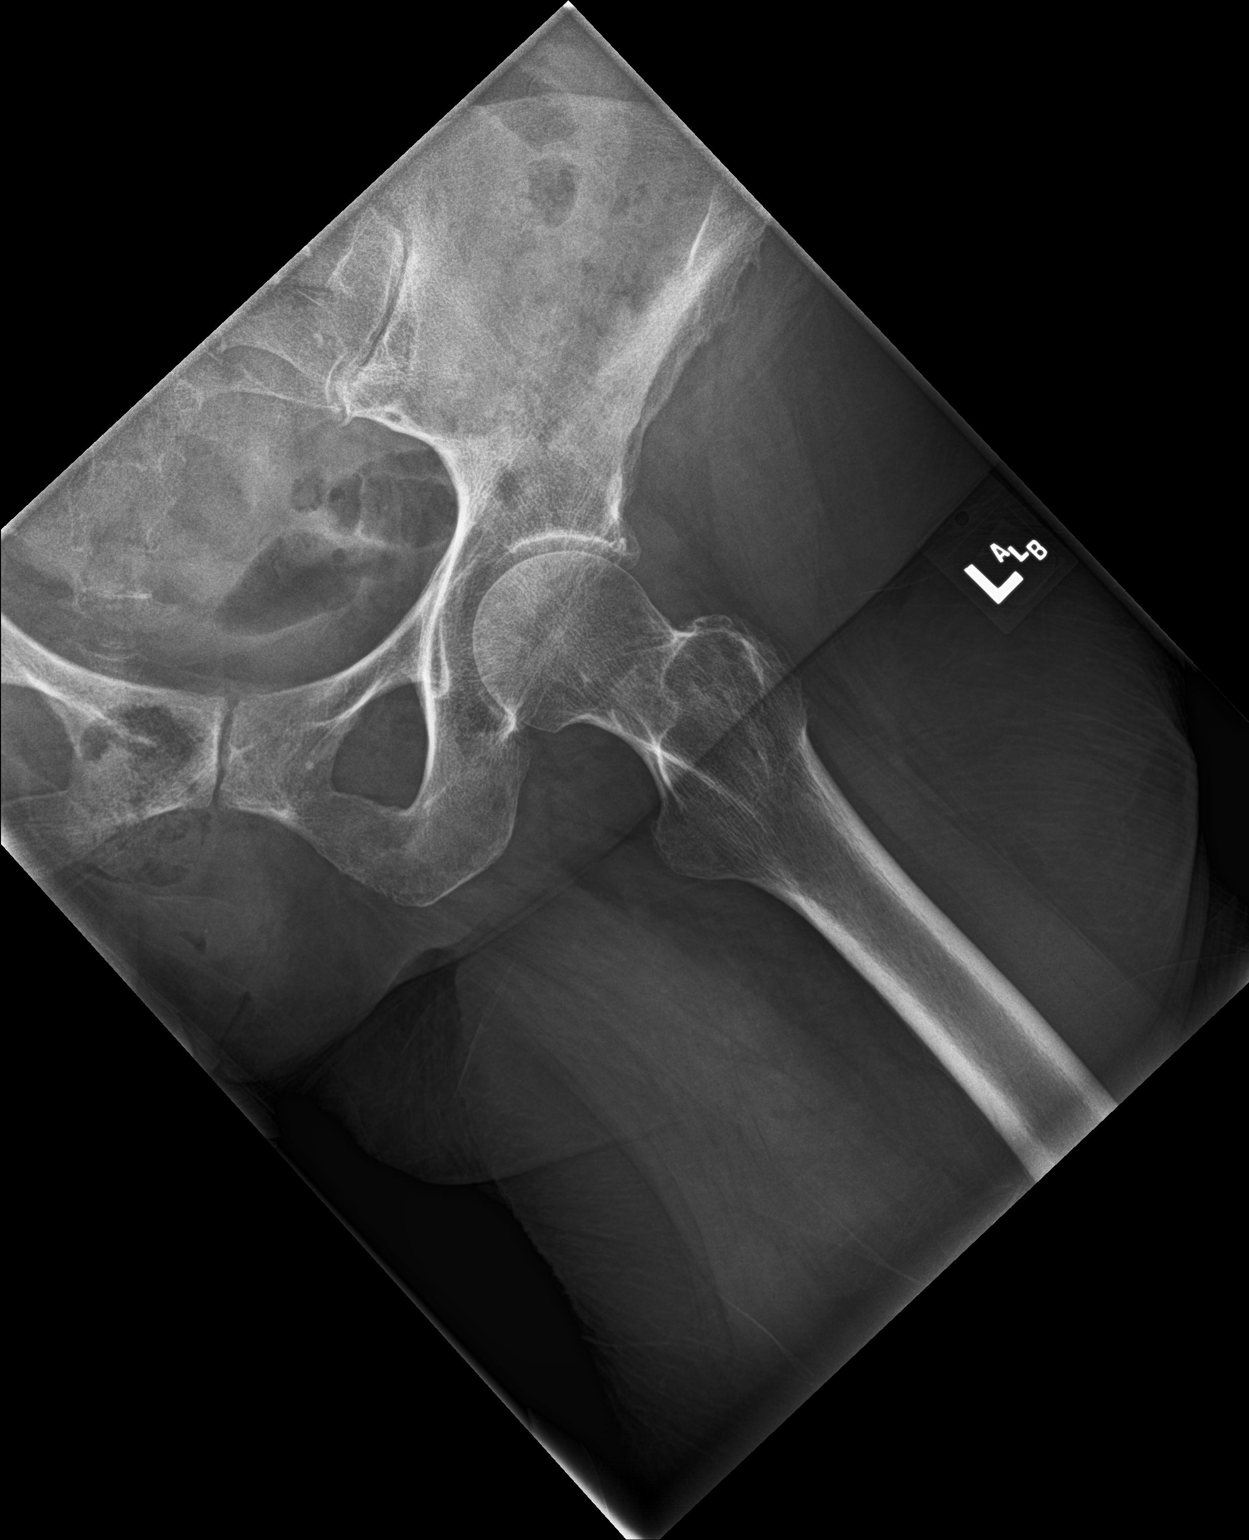

[2 of 2 positions shown; findings below may reference images not displayed]

FINDINGS: Degenerative changes lumbar spine and left hip. No evidence of
fracture or dislocation. No acute abnormality identified.
IMPRESSION: Degenerative changes lumbar spine and left hip. No acute abnormality
identified.

## 2019-08-11 DIAGNOSIS — M1712 Unilateral primary osteoarthritis, left knee: Secondary | ICD-10-CM | POA: Diagnosis not present

## 2019-08-18 DIAGNOSIS — M25559 Pain in unspecified hip: Secondary | ICD-10-CM | POA: Diagnosis not present

## 2019-08-18 DIAGNOSIS — G894 Chronic pain syndrome: Secondary | ICD-10-CM | POA: Diagnosis not present

## 2019-08-18 DIAGNOSIS — M545 Low back pain: Secondary | ICD-10-CM | POA: Diagnosis not present

## 2019-08-18 DIAGNOSIS — M47896 Other spondylosis, lumbar region: Secondary | ICD-10-CM | POA: Diagnosis not present

## 2019-08-18 DIAGNOSIS — M169 Osteoarthritis of hip, unspecified: Secondary | ICD-10-CM | POA: Diagnosis not present

## 2019-08-18 DIAGNOSIS — Z86018 Personal history of other benign neoplasm: Secondary | ICD-10-CM | POA: Diagnosis not present

## 2019-08-18 DIAGNOSIS — M5136 Other intervertebral disc degeneration, lumbar region: Secondary | ICD-10-CM | POA: Diagnosis not present

## 2019-08-18 DIAGNOSIS — Z79899 Other long term (current) drug therapy: Secondary | ICD-10-CM | POA: Diagnosis not present

## 2019-08-26 ENCOUNTER — Ambulatory Visit (INDEPENDENT_AMBULATORY_CARE_PROVIDER_SITE_OTHER): Payer: Medicare Other | Admitting: Internal Medicine

## 2019-08-26 ENCOUNTER — Encounter: Payer: Self-pay | Admitting: Internal Medicine

## 2019-08-26 ENCOUNTER — Other Ambulatory Visit: Payer: Self-pay

## 2019-08-26 VITALS — BP 116/74 | HR 67 | Temp 97.9°F | Ht 63.0 in | Wt 163.0 lb

## 2019-08-26 DIAGNOSIS — I10 Essential (primary) hypertension: Secondary | ICD-10-CM | POA: Diagnosis not present

## 2019-08-26 DIAGNOSIS — J4520 Mild intermittent asthma, uncomplicated: Secondary | ICD-10-CM | POA: Diagnosis not present

## 2019-08-26 DIAGNOSIS — E785 Hyperlipidemia, unspecified: Secondary | ICD-10-CM

## 2019-08-26 DIAGNOSIS — K2100 Gastro-esophageal reflux disease with esophagitis, without bleeding: Secondary | ICD-10-CM

## 2019-08-26 DIAGNOSIS — Z1231 Encounter for screening mammogram for malignant neoplasm of breast: Secondary | ICD-10-CM

## 2019-08-26 DIAGNOSIS — I480 Paroxysmal atrial fibrillation: Secondary | ICD-10-CM | POA: Diagnosis not present

## 2019-08-26 DIAGNOSIS — E034 Atrophy of thyroid (acquired): Secondary | ICD-10-CM | POA: Diagnosis not present

## 2019-08-26 LAB — POCT URINALYSIS DIPSTICK
Bilirubin, UA: NEGATIVE
Blood, UA: NEGATIVE
Glucose, UA: NEGATIVE
Ketones, UA: NEGATIVE
Leukocytes, UA: NEGATIVE
Nitrite, UA: NEGATIVE
Protein, UA: NEGATIVE
Spec Grav, UA: 1.02 (ref 1.010–1.025)
Urobilinogen, UA: 0.2 E.U./dL
pH, UA: 6.5 (ref 5.0–8.0)

## 2019-08-26 NOTE — Progress Notes (Signed)
Date:  08/26/2019   Name:  Crystal Zavala   DOB:  04-24-1932   MRN:  [id number]   Chief Complaint: Hypertension (Breast Exam. Medicare yearly. ) Crystal Zavala is a 86 y.o. female who presents today for her Complete Annual Exam. She feels fairly well. She reports exercising doing housework mostly. She reports she is sleeping fairly well. She is scheduled to get her Covid vaccine at [facility]. She denies breast issues.  Mammogram  09/2018 Colonoscopy - aged out Immunization History  Administered Date(s) Administered  . Fluad Quad(high Dose 65+) 03/31/2019  . Influenza, High Dose Seasonal PF 04/22/2017  . Influenza,inj,Quad PF,6+ Mos 04/22/2016  . Influenza-Unspecified 06/05/2018  . Pneumococcal Conjugate-13 09/13/2014  . Pneumococcal Polysaccharide-23 10/03/2004  . Tdap 08/27/2011    Hypertension This is a chronic problem. The problem is controlled (bp at home is normal). Pertinent negatives include no chest pain, headaches, palpitations or shortness of breath. Past treatments include calcium channel blockers. The current treatment provides significant improvement. Identifiable causes of hypertension include a thyroid problem.  Thyroid Problem Presents for follow-up visit. Patient reports no anxiety, constipation, depressed mood, diaphoresis, diarrhea, fatigue, hoarse voice, leg swelling, palpitations, tremors or weight loss.  Gastroesophageal Reflux She reports no abdominal pain, no chest pain, no coughing, no hoarse voice or no wheezing. Pertinent negatives include no fatigue or weight loss. She has tried a PPI for the symptoms. The treatment provided moderate (occasionally takes extra tums) relief.  Asthma There is no cough, difficulty breathing, hoarse voice, shortness of breath or wheezing. Pertinent negatives include no chest pain, fever, headaches, trouble swallowing or weight loss. Her symptoms are alleviated by beta-agonist and steroid inhaler (rarely uses albuterol rescue inhaler).  She reports significant improvement on treatment. Her past medical history is significant for asthma.  Back Pain This is a chronic problem. The problem occurs daily. Progression since onset: much better since starting Lyrica. The pain is present in the lumbar spine. Pertinent negatives include no abdominal pain, chest pain, dysuria, fever, headaches or weight loss.  Atrial fibrillation - she has had Afib and is on cardiazem for control.  She is on eliquis for anticoagulation and doing well.  She denies bleeding issues.  She has not been aware of any Afib symptoms.   Lab Results  Component Value Date   CREATININE 1.02 (H) 08/21/2018   BUN 20 08/21/2018   NA 140 08/21/2018   K 4.7 08/21/2018   CL 101 08/21/2018   CO2 24 08/21/2018   Lab Results  Component Value Date   CHOL 252 (H) 08/21/2018   HDL 80 08/21/2018   LDLCALC 153 (H) 08/21/2018   TRIG 94 08/21/2018   CHOLHDL 3.2 08/21/2018   Lab Results  Component Value Date   TSH 4.270 03/31/2019   Lab Results  Component Value Date   HGBA1C 5.2 04/22/2017     Review of Systems  Constitutional: Negative for chills, diaphoresis, fatigue, fever and weight loss.  HENT: Negative for congestion, hearing loss, hoarse voice, tinnitus, trouble swallowing and voice change.   Eyes: Negative for visual disturbance.  Respiratory: Negative for cough, chest tightness, shortness of breath and wheezing.   Cardiovascular: Negative for chest pain, palpitations and leg swelling.  Gastrointestinal: Negative for abdominal pain, constipation, diarrhea and vomiting.  Endocrine: Negative for polydipsia and polyuria.  Genitourinary: Negative for dysuria, frequency, genital sores, hematuria, vaginal bleeding and vaginal discharge.  Musculoskeletal: Positive for back pain. Negative for arthralgias, gait problem and joint swelling.  Skin:  Negative for color change and rash.  Neurological: Negative for dizziness, tremors, light-headedness and headaches.    Hematological: Negative for adenopathy. Does not bruise/bleed easily.  Psychiatric/Behavioral: Negative for dysphoric mood and sleep disturbance. The patient is not nervous/anxious.     Patient Active Problem List   Diagnosis Date Noted  . Atrial fibrillation, new onset ([place]) 02/23/2019  . Allergy to tramadol 09/03/2018  . Pain of both hip joints 04/22/2017  . Renal insufficiency 01/05/2017  . 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 Spondylosis of lumbar region without myelopathy or radiculopathy 10/21/2016  . Hypothyroidism due to acquired atrophy of thyroid 04/22/2016  . Insomnia 04/22/2016  . DDD (degenerative disc disease), lumbar 04/22/2016  . Breast mass, right 01/10/2016  . Aortic atherosclerosis ([place]) 01/18/2015  . Arthritis of neck 01/18/2015  . Dermatitis, eczematoid 01/18/2015  . Essential (primary) hypertension 01/18/2015  . Esophagitis, reflux 01/18/2015  . H/O adenomatous polyp of colon 01/18/2015  . Migraine without aura and responsive to treatment 01/18/2015  . Asthma, mild intermittent 01/18/2015  . Hyperlipidemia, mild 01/18/2015  . Lung nodule, multiple 01/18/2015  . Kidney cysts 01/18/2015  . Tobacco use disorder, moderate, in sustained remission 01/18/2015  . Temporary cerebral vascular dysfunction 01/18/2015    Allergies  Allergen Reactions  . Amlodipine Swelling  . Tramadol Nausea Only and Rash  . Ace Inhibitors Cough    Past Surgical History:  Procedure Laterality Date  . ABDOMINAL HYSTERECTOMY    . BLEPHAROPLASTY Bilateral 2013  . BREAST BIOPSY Left 2009   benign  . COLONOSCOPY  2006   Normal  . ESI  01/2017   L5-S1 @ [facility]  . ESOPHAGOGASTRODUODENOSCOPY  2015   Dilation of esophageal stricture  . TOTAL HIP ARTHROPLASTY Right 2019    Social History   Tobacco Use  . Smoking status: Former Smoker    Packs/day: 0.50    Years: 39.00    Pack years: 19.50    Types: Cigarettes    Quit date: 1992    Years  since quitting: 29.0  . Smokeless tobacco: Never Used  . Tobacco comment: smoking cessation materails not required  Substance Use Topics  . Alcohol use: No    Alcohol/week: 0.0 standard drinks  . Drug use: No     Medication list has been reviewed and updated.  Current Meds  Medication Sig  . calcium-vitamin D (OSCAL WITH D) 500-200 MG-UNIT tablet Take 1 tablet by mouth.  . cetirizine (ZYRTEC) 10 MG tablet Take 10 mg by mouth daily.  Marland Kitchen diltiazem (TIAZAC) 240 MG 24 hr capsule Take 1 capsule by mouth daily.  Marland Kitchen ELIQUIS 5 MG TABS tablet Take 5 mg by mouth 2 (two) times daily.  . Fluticasone-Salmeterol (ADVAIR DISKUS) 250-50 MCG/DOSE AEPB Inhale 1 puff into the lungs 2 (two) times daily.  . hydrochlorothiazide (HYDRODIURIL) 25 MG tablet Take 1 tablet (25 mg total) by mouth daily.  Marland Kitchen HYDROcodone-acetaminophen (NORCO/VICODIN) 5-325 MG tablet Take 1 tablet by mouth every 6 (six) hours as needed for moderate pain.  Marland Kitchen levothyroxine (SYNTHROID) 75 MCG tablet Take 1 tablet (75 mcg total) by mouth daily.  . pantoprazole (PROTONIX) 40 MG tablet Take 1 tablet (40 mg total) by mouth daily.  . pregabalin (LYRICA) 50 MG capsule Take 150 mg by mouth daily.     PHQ 2/9 Scores 08/26/2019 03/31/2019 11/30/2018 08/21/2018  PHQ - 2 Score 0 2 0 0  PHQ- 9 Score - 5 - -    BP Readings from  Last 3 Encounters:  08/26/19 116/74  03/31/19 118/82  11/30/18 128/78    Physical Exam Vitals and nursing note reviewed.  Constitutional:      General: She is not in acute distress.    Appearance: She is well-developed.  HENT:     Head: Normocephalic and atraumatic.     Right Ear: Tympanic membrane and ear canal normal.     Left Ear: Tympanic membrane and ear canal normal.     Nose:     Right Sinus: No maxillary sinus tenderness.     Left Sinus: No maxillary sinus tenderness.  Eyes:     General: No scleral icterus.       Right eye: No discharge.        Left eye: No discharge.     Conjunctiva/sclera:  Conjunctivae normal.  Neck:     Thyroid: No thyromegaly.     Vascular: No carotid bruit.  Cardiovascular:     Rate and Rhythm: Normal rate and regular rhythm.     Pulses: Normal pulses.     Heart sounds: Normal heart sounds.  Pulmonary:     Effort: Pulmonary effort is normal. No respiratory distress.     Breath sounds: No wheezing.  Chest:     Chest wall: Tenderness present.     Breasts:        Right: No mass, nipple discharge, skin change or tenderness.        Left: No mass, nipple discharge, skin change or tenderness.     Comments: Tender laterally under both axilla. Abdominal:     General: Bowel sounds are normal.     Palpations: Abdomen is soft.     Tenderness: There is no abdominal tenderness.  Musculoskeletal:     Cervical back: Normal range of motion. No erythema.     Right lower leg: No edema.     Left lower leg: No edema.  Lymphadenopathy:     Cervical: No cervical adenopathy.  Skin:    General: Skin is warm and dry.     Capillary Refill: Capillary refill takes less than 2 seconds.     Findings: No rash.  Neurological:     General: No focal deficit present.     Mental Status: She is alert and oriented to person, place, and time.     Cranial Nerves: No cranial nerve deficit.     Sensory: No sensory deficit.     Deep Tendon Reflexes: Reflexes are normal and symmetric.  Psychiatric:        Attention and Perception: Attention normal.        Mood and Affect: Mood normal.        Speech: Speech normal.        Behavior: Behavior normal.        Thought Content: Thought content normal.     Wt Readings from Last 3 Encounters:  08/26/19 163 lb (73.9 kg)  03/31/19 165 lb (74.8 kg)  11/30/18 169 lb (76.7 kg)    BP 116/74 (BP Location: Right Wrist, Patient Position: Sitting, Cuff Size: Normal)   Pulse 67   Temp 97.9 F (36.6 C) (Oral)   Ht 5\' 3"  (1.6 m)   Wt 163 lb (73.9 kg)   SpO2 97%   BMI 28.87 kg/m   Assessment and Plan: 1. Essential (primary)  hypertension Clinically stable exam with well controlled BP on hctz and cardiazem. Tolerating medications without side effects at this time. Pt to continue current regimen and low sodium diet; benefits  of regular exercise as able discussed. - Comprehensive metabolic panel - POCT urinalysis dipstick  2. Hypothyroidism due to acquired atrophy of thyroid supplemented - TSH + free T4  3. Mild intermittent asthma without complication Followed by Pulmonary - doing well with rare use of rescue inhaler  4. Gastroesophageal reflux disease with esophagitis without hemorrhage Symptoms well controlled on daily PPI No red flag signs such as weight loss, n/v, melena Will continue pantoprazole. - CBC with Differential/Platelet  5. Hyperlipidemia, mild Mild elevations - no medication recommended - Lipid panel  6. Encounter for screening mammogram for breast cancer To be scheduled in [place]; Future  7. Paroxysmal atrial fibrillation ([place]) Followed by cardiology - on cardizem and eliquis   Partially dictated using [place]. Any errors are unintentional.  Halina Maidens, MD [facility]  08/26/2019

## 2019-08-27 LAB — LIPID PANEL
Chol/HDL Ratio: 3.1 ratio (ref 0.0–4.4)
Cholesterol, Total: 240 mg/dL — ABNORMAL HIGH (ref 100–199)
HDL: 78 mg/dL (ref >39)
LDL Chol Calc (NIH): 147 mg/dL — ABNORMAL HIGH (ref 0–99)
Triglycerides: 85 mg/dL (ref 0–149)
VLDL Cholesterol Cal: 15 mg/dL (ref 5–40)

## 2019-08-27 LAB — CBC WITH DIFFERENTIAL/PLATELET
Basophils Absolute: 0 10*3/uL (ref 0.0–0.2)
Basos: 0 %
EOS (ABSOLUTE): 0.1 10*3/uL (ref 0.0–0.4)
Eos: 1 %
Hematocrit: 42.2 % (ref 34.0–46.6)
Hemoglobin: 14 g/dL (ref 11.1–15.9)
Immature Grans (Abs): 0 10*3/uL (ref 0.0–0.1)
Immature Granulocytes: 0 %
Lymphocytes Absolute: 1.4 10*3/uL (ref 0.7–3.1)
Lymphs: 13 %
MCH: 31.4 pg (ref 26.6–33.0)
MCHC: 33.2 g/dL (ref 31.5–35.7)
MCV: 95 fL (ref 79–97)
Monocytes Absolute: 0.8 10*3/uL (ref 0.1–0.9)
Monocytes: 7 %
Neutrophils Absolute: 8.5 10*3/uL — ABNORMAL HIGH (ref 1.4–7.0)
Neutrophils: 79 %
Platelets: 265 10*3/uL (ref 150–450)
RBC: 4.46 x10E6/uL (ref 3.77–5.28)
RDW: 12.3 % (ref 11.7–15.4)
WBC: 10.8 10*3/uL (ref 3.4–10.8)

## 2019-08-27 LAB — COMPREHENSIVE METABOLIC PANEL
ALT: 13 IU/L (ref 0–32)
AST: 14 IU/L (ref 0–40)
Albumin/Globulin Ratio: 1.9 (ref 1.2–2.2)
Albumin: 4 g/dL (ref 3.6–4.6)
Alkaline Phosphatase: 104 IU/L (ref 39–117)
BUN/Creatinine Ratio: 23 (ref 12–28)
BUN: 28 mg/dL — ABNORMAL HIGH (ref 8–27)
Bilirubin Total: 0.5 mg/dL (ref 0.0–1.2)
CO2: 25 mmol/L (ref 20–29)
Calcium: 10 mg/dL (ref 8.7–10.3)
Chloride: 103 mmol/L (ref 96–106)
Creatinine, Ser: 1.21 mg/dL — ABNORMAL HIGH (ref 0.57–1.00)
GFR calc Af Amer: 47 mL/min/{1.73_m2} — ABNORMAL LOW (ref >59)
GFR calc non Af Amer: 41 mL/min/{1.73_m2} — ABNORMAL LOW (ref >59)
Globulin, Total: 2.1 g/dL (ref 1.5–4.5)
Glucose: 89 mg/dL (ref 65–99)
Potassium: 4.3 mmol/L (ref 3.5–5.2)
Sodium: 140 mmol/L (ref 134–144)
Total Protein: 6.1 g/dL (ref 6.0–8.5)

## 2019-08-27 LAB — TSH+FREE T4
Free T4: 1.18 ng/dL (ref 0.82–1.77)
TSH: 17.6 u[IU]/mL — ABNORMAL HIGH (ref 0.450–4.500)

## 2019-08-28 DIAGNOSIS — Z23 Encounter for immunization: Secondary | ICD-10-CM | POA: Diagnosis not present

## 2019-09-08 DIAGNOSIS — N839 Noninflammatory disorder of ovary, fallopian tube and broad ligament, unspecified: Secondary | ICD-10-CM | POA: Diagnosis not present

## 2019-09-08 DIAGNOSIS — R229 Localized swelling, mass and lump, unspecified: Secondary | ICD-10-CM | POA: Diagnosis not present

## 2019-09-08 DIAGNOSIS — R1909 Other intra-abdominal and pelvic swelling, mass and lump: Secondary | ICD-10-CM | POA: Diagnosis not present

## 2019-09-09 DIAGNOSIS — M1712 Unilateral primary osteoarthritis, left knee: Secondary | ICD-10-CM | POA: Diagnosis not present

## 2019-09-14 DIAGNOSIS — D3610 Benign neoplasm of peripheral nerves and autonomic nervous system, unspecified: Secondary | ICD-10-CM | POA: Diagnosis not present

## 2019-09-16 DIAGNOSIS — M1712 Unilateral primary osteoarthritis, left knee: Secondary | ICD-10-CM | POA: Diagnosis not present

## 2019-09-19 DIAGNOSIS — Z23 Encounter for immunization: Secondary | ICD-10-CM | POA: Diagnosis not present

## 2019-09-24 DIAGNOSIS — M1712 Unilateral primary osteoarthritis, left knee: Secondary | ICD-10-CM | POA: Diagnosis not present

## 2019-09-30 DIAGNOSIS — M1712 Unilateral primary osteoarthritis, left knee: Secondary | ICD-10-CM | POA: Diagnosis not present

## 2019-10-25 DIAGNOSIS — Z1231 Encounter for screening mammogram for malignant neoplasm of breast: Secondary | ICD-10-CM | POA: Diagnosis not present

## 2019-10-28 DIAGNOSIS — M170 Bilateral primary osteoarthritis of knee: Secondary | ICD-10-CM | POA: Diagnosis not present

## 2019-11-04 DIAGNOSIS — I480 Paroxysmal atrial fibrillation: Secondary | ICD-10-CM | POA: Diagnosis not present

## 2019-11-08 DIAGNOSIS — H401132 Primary open-angle glaucoma, bilateral, moderate stage: Secondary | ICD-10-CM | POA: Diagnosis not present

## 2019-11-10 DIAGNOSIS — M47896 Other spondylosis, lumbar region: Secondary | ICD-10-CM | POA: Diagnosis not present

## 2019-11-10 DIAGNOSIS — G894 Chronic pain syndrome: Secondary | ICD-10-CM | POA: Diagnosis not present

## 2019-11-10 DIAGNOSIS — Z79899 Other long term (current) drug therapy: Secondary | ICD-10-CM | POA: Diagnosis not present

## 2019-11-10 DIAGNOSIS — Z86018 Personal history of other benign neoplasm: Secondary | ICD-10-CM | POA: Diagnosis not present

## 2019-11-10 DIAGNOSIS — M25559 Pain in unspecified hip: Secondary | ICD-10-CM | POA: Diagnosis not present

## 2019-11-10 DIAGNOSIS — M533 Sacrococcygeal disorders, not elsewhere classified: Secondary | ICD-10-CM | POA: Diagnosis not present

## 2019-11-10 DIAGNOSIS — M545 Low back pain: Secondary | ICD-10-CM | POA: Diagnosis not present

## 2019-11-10 DIAGNOSIS — Z5181 Encounter for therapeutic drug level monitoring: Secondary | ICD-10-CM | POA: Diagnosis not present

## 2019-11-10 DIAGNOSIS — M169 Osteoarthritis of hip, unspecified: Secondary | ICD-10-CM | POA: Diagnosis not present

## 2019-11-10 DIAGNOSIS — Z79891 Long term (current) use of opiate analgesic: Secondary | ICD-10-CM | POA: Diagnosis not present

## 2019-11-10 DIAGNOSIS — M5136 Other intervertebral disc degeneration, lumbar region: Secondary | ICD-10-CM | POA: Diagnosis not present

## 2019-11-29 DIAGNOSIS — M533 Sacrococcygeal disorders, not elsewhere classified: Secondary | ICD-10-CM | POA: Diagnosis not present

## 2019-12-01 ENCOUNTER — Ambulatory Visit (INDEPENDENT_AMBULATORY_CARE_PROVIDER_SITE_OTHER): Payer: Medicare Other

## 2019-12-01 DIAGNOSIS — Z0000 Encounter for general adult medical examination without abnormal findings: Secondary | ICD-10-CM

## 2019-12-01 DIAGNOSIS — Z87891 Personal history of nicotine dependence: Secondary | ICD-10-CM

## 2019-12-01 NOTE — Patient Instructions (Signed)
Crystal Zavala , Thank you for taking time to come for your Medicare Wellness Visit. I appreciate your ongoing commitment to your health goals. Please review the following plan we discussed and let me know if I can assist you in the future.   Screening recommendations/referrals: Colonoscopy: no longer required Mammogram: done 10/25/19 Bone Density: no longer required Recommended yearly ophthalmology/optometry visit for glaucoma screening and checkup Recommended yearly dental visit for hygiene and checkup  Vaccinations: Influenza vaccine: done 03/31/19 Pneumococcal vaccine: done 09/13/14 Tdap vaccine: done 08/27/11 Shingles vaccine: Shingrix discussed. Please contact your pharmacy for coverage information.  Covid-19: done 08/28/19 & 09/19/19  Advanced directives: Please bring a copy of your health care power of attorney and living will to the office at your convenience.  Conditions/risks identified: Recommend increasing physical activity  Next appointment: Please follow up in one year for your Medicare Annual Wellness visit.     Preventive Care 53 Years and Older, Female Preventive care refers to lifestyle choices and visits with your health care provider that can promote health and wellness. What does preventive care include?  A yearly physical exam. This is also called an annual well check.  Dental exams once or twice a year.  Routine eye exams. Ask your health care provider how often you should have your eyes checked.  Personal lifestyle choices, including:  Daily care of your teeth and gums.  Regular physical activity.  Eating a healthy diet.  Avoiding tobacco and drug use.  Limiting alcohol use.  Practicing safe sex.  Taking low-dose aspirin every day.  Taking vitamin and mineral supplements as recommended by your health care provider. What happens during an annual well check? The services and screenings done by your health care provider during your annual well check  will depend on your age, overall health, lifestyle risk factors, and family history of disease. Counseling  Your health care provider may ask you questions about your:  Alcohol use.  Tobacco use.  Drug use.  Emotional well-being.  Home and relationship well-being.  Sexual activity.  Eating habits.  History of falls.  Memory and ability to understand (cognition).  Work and work Statistician.  Reproductive health. Screening  You may have the following tests or measurements:  Height, weight, and BMI.  Blood pressure.  Lipid and cholesterol levels. These may be checked every 5 years, or more frequently if you are over 49 years old.  Skin check.  Lung cancer screening. You may have this screening every year starting at age 60 if you have a 30-pack-year history of smoking and currently smoke or have quit within the past 15 years.  Fecal occult blood test (FOBT) of the stool. You may have this test every year starting at age 48.  Flexible sigmoidoscopy or colonoscopy. You may have a sigmoidoscopy every 5 years or a colonoscopy every 10 years starting at age 54.  Hepatitis C blood test.  Hepatitis B blood test.  Sexually transmitted disease (STD) testing.  Diabetes screening. This is done by checking your blood sugar (glucose) after you have not eaten for a while (fasting). You may have this done every 1-3 years.  Bone density scan. This is done to screen for osteoporosis. You may have this done starting at age 65.  Mammogram. This may be done every 1-2 years. Talk to your health care provider about how often you should have regular mammograms. Talk with your health care provider about your test results, treatment options, and if necessary, the need for more tests.  Vaccines  Your health care provider may recommend certain vaccines, such as:  Influenza vaccine. This is recommended every year.  Tetanus, diphtheria, and acellular pertussis (Tdap, Td) vaccine. You may  need a Td booster every 10 years.  Zoster vaccine. You may need this after age 54.  Pneumococcal 13-valent conjugate (PCV13) vaccine. One dose is recommended after age 77.  Pneumococcal polysaccharide (PPSV23) vaccine. One dose is recommended after age 26. Talk to your health care provider about which screenings and vaccines you need and how often you need them. This information is not intended to replace advice given to you by your health care provider. Make sure you discuss any questions you have with your health care provider. Document Released: 08/18/2015 Document Revised: 04/10/2016 Document Reviewed: 05/23/2015 Elsevier Interactive Patient Education  2017 [place] Prevention in the Home Falls can cause injuries. They can happen to people of all ages. There are many things you can do to make your home safe and to help prevent falls. What can I do on the outside of my home?  Regularly fix the edges of walkways and driveways and fix any cracks.  Remove anything that might make you trip as you walk through a door, such as a raised step or threshold.  Trim any bushes or trees on the path to your home.  Use bright outdoor lighting.  Clear any walking paths of anything that might make someone trip, such as rocks or tools.  Regularly check to see if handrails are loose or broken. Make sure that both sides of any steps have handrails.  Any raised decks and porches should have guardrails on the edges.  Have any leaves, snow, or ice cleared regularly.  Use sand or salt on walking paths during winter.  Clean up any spills in your garage right away. This includes oil or grease spills. What can I do in the bathroom?  Use night lights.  Install grab bars by the toilet and in the tub and shower. Do not use towel bars as grab bars.  Use non-skid mats or decals in the tub or shower.  If you need to sit down in the shower, use a plastic, non-slip stool.  Keep the floor  dry. Clean up any water that spills on the floor as soon as it happens.  Remove soap buildup in the tub or shower regularly.  Attach bath mats securely with double-sided non-slip rug tape.  Do not have throw rugs and other things on the floor that can make you trip. What can I do in the bedroom?  Use night lights.  Make sure that you have a light by your bed that is easy to reach.  Do not use any sheets or blankets that are too big for your bed. They should not hang down onto the floor.  Have a firm chair that has side arms. You can use this for support while you get dressed.  Do not have throw rugs and other things on the floor that can make you trip. What can I do in the kitchen?  Clean up any spills right away.  Avoid walking on wet floors.  Keep items that you use a lot in easy-to-reach places.  If you need to reach something above you, use a strong step stool that has a grab bar.  Keep electrical cords out of the way.  Do not use floor polish or wax that makes floors slippery. If you must use wax, use non-skid floor wax.  Do not have throw rugs and other things on the floor that can make you trip. What can I do with my stairs?  Do not leave any items on the stairs.  Make sure that there are handrails on both sides of the stairs and use them. Fix handrails that are broken or loose. Make sure that handrails are as long as the stairways.  Check any carpeting to make sure that it is firmly attached to the stairs. Fix any carpet that is loose or worn.  Avoid having throw rugs at the top or bottom of the stairs. If you do have throw rugs, attach them to the floor with carpet tape.  Make sure that you have a light switch at the top of the stairs and the bottom of the stairs. If you do not have them, ask someone to add them for you. What else can I do to help prevent falls?  Wear shoes that:  Do not have high heels.  Have rubber bottoms.  Are comfortable and fit you  well.  Are closed at the toe. Do not wear sandals.  If you use a stepladder:  Make sure that it is fully opened. Do not climb a closed stepladder.  Make sure that both sides of the stepladder are locked into place.  Ask someone to hold it for you, if possible.  Clearly mark and make sure that you can see:  Any grab bars or handrails.  First and last steps.  Where the edge of each step is.  Use tools that help you move around (mobility aids) if they are needed. These include:  Canes.  Walkers.  Scooters.  Crutches.  Turn on the lights when you go into a dark area. Replace any light bulbs as soon as they burn out.  Set up your furniture so you have a clear path. Avoid moving your furniture around.  If any of your floors are uneven, fix them.  If there are any pets around you, be aware of where they are.  Review your medicines with your doctor. Some medicines can make you feel dizzy. This can increase your chance of falling. Ask your doctor what other things that you can do to help prevent falls. This information is not intended to replace advice given to you by your health care provider. Make sure you discuss any questions you have with your health care provider. Document Released: 05/18/2009 Document Revised: 12/28/2015 Document Reviewed: 08/26/2014 Elsevier Interactive Patient Education  2017 [employer].

## 2019-12-01 NOTE — Progress Notes (Signed)
Subjective:   Crystal Zavala is a 86 y.o. female who presents for Medicare Annual (Subsequent) preventive examination.  Virtual Visit via Telephone Note  I connected with  Crystal Zavala on 12/01/19 at 10:00 AM EDT by telephone and verified that I am speaking with the correct person using two identifiers.  Medicare Annual Wellness visit completed telephonically due to Covid-19 pandemic.   Location: Patient: home Provider: office   I discussed the limitations, risks, security and privacy concerns of performing an evaluation and management service by telephone and the availability of in person appointments. The patient expressed understanding and agreed to proceed.  Unable to perform video visit due to patient does not have video capability.   Some vital signs may be absent or patient reported.   Clemetine Marker, LPN    Review of Systems:   Cardiac Risk Factors include: advanced age (>31men, >36 women);hypertension     Objective:     Vitals: There were no vitals taken for this visit.  There is no height or weight on file to calculate BMI.  Advanced Directives 12/01/2019 11/30/2018 10/23/2017 10/21/2016 09/12/2015  Does Patient Have a Medical Advance Directive? Yes Yes Yes Yes No  Type of Paramedic of [place];Living will [place];Living will [place];Living will [place];Living will -  Copy of [place] in Chart? No - copy requested No - copy requested No - copy requested No - copy requested -  Would patient like information on creating a medical advance directive? - - - - No - patient declined information    Tobacco Social History   Tobacco Use  Smoking Status Former Smoker  . Packs/day: 0.50  . Years: 39.00  . Pack years: 19.50  . Types: Cigarettes  . Quit date: 94  . Years since quitting: 29.3  Smokeless Tobacco Never Used  Tobacco Comment   smoking cessation  materails not required     Counseling given: Not Answered Comment: smoking cessation materails not required   Clinical Intake:  Pre-visit preparation completed: Yes  Pain : 0-10 Pain Score: 7  Pain Type: Chronic pain Pain Location: Back Pain Orientation: Mid, Upper Pain Descriptors / Indicators: Aching, Sore Pain Onset: More than a month ago Pain Frequency: Constant     Nutritional Risks: None Diabetes: No  How often do you need to have someone help you when you read instructions, pamphlets, or other written materials from your doctor or pharmacy?: 1 - Never  Interpreter Needed?: No  Information entered by :: Clemetine Marker LPN  Past Medical History:  Diagnosis Date  . Allergy to tramadol 09/03/2018  . Asthma   . GERD (gastroesophageal reflux disease)   . Hyperlipidemia   . Hypertension   . Osteoarthritis    in back  . Scoliosis   . Spondylosis   . Thyroid disease    Past Surgical History:  Procedure Laterality Date  . ABDOMINAL HYSTERECTOMY    . BLEPHAROPLASTY Bilateral 2013  . BREAST BIOPSY Left 2009   benign  . COLONOSCOPY  2006   Normal  . ESI  01/2017   L5-S1 @ [facility]  . ESOPHAGOGASTRODUODENOSCOPY  2015   Dilation of esophageal stricture  . TOTAL HIP ARTHROPLASTY Right 2019   Family History  Problem Relation Age of Onset  . Tuberculosis Mother   . Stroke Father   . Bipolar disorder Daughter   . Alzheimer's disease Brother   . Heart attack Brother   .  Heart disease Brother   . Bipolar disorder Sister   . Diabetes Son   . Heart attack Brother   . Heart disease Brother   . Heart attack Brother   . Heart disease Brother    Social History   Socioeconomic History  . Marital status: Widowed    Spouse name: Not on file  . Number of children: 2  . Years of education: Not on file  . Highest education level: 12th grade  Occupational History  . Occupation: Retired  Tobacco Use  . Smoking status: Former Smoker    Packs/day: 0.50    Years: 39.00      Pack years: 19.50    Types: Cigarettes    Quit date: 1992    Years since quitting: 29.3  . Smokeless tobacco: Never Used  . Tobacco comment: smoking cessation materails not required  Substance and Sexual Activity  . Alcohol use: No    Alcohol/week: 0.0 standard drinks  . Drug use: No  . Sexual activity: Never  Other Topics Concern  . Not on file  Social History Narrative  . Not on file   Social Determinants of Health   Financial Resource Strain: Low Risk   . Difficulty of Paying Living Expenses: Not hard at all  Food Insecurity: No Food Insecurity  . Worried About Charity fundraiser in the Last Year: Never true  . Ran Out of Food in the Last Year: Never true  Transportation Needs: No Transportation Needs  . Lack of Transportation (Medical): No  . Lack of Transportation (Non-Medical): No  Physical Activity: Inactive  . Days of Exercise per Week: 0 days  . Minutes of Exercise per Session: 0 min  Stress: No Stress Concern Present  . Feeling of Stress : Not at all  Social Connections: Somewhat Isolated  . Frequency of Communication with Friends and Family: More than three times a week  . Frequency of Social Gatherings with Friends and Family: More than three times a week  . Attends Religious Services: More than 4 times per year  . Active Member of Clubs or Organizations: No  . Attends Archivist Meetings: Never  . Marital Status: Widowed    Outpatient Encounter Medications as of 12/01/2019  Medication Sig  . cetirizine (ZYRTEC) 10 MG tablet Take 10 mg by mouth daily.  Marland Kitchen diltiazem (CARDIZEM CD) 240 MG 24 hr capsule Take 240 mg by mouth daily.  Marland Kitchen ELIQUIS 5 MG TABS tablet Take 5 mg by mouth 2 (two) times daily.  . Fluticasone-Salmeterol (ADVAIR DISKUS) 250-50 MCG/DOSE AEPB Inhale 1 puff into the lungs 2 (two) times daily.  . hydrochlorothiazide (HYDRODIURIL) 25 MG tablet Take 1 tablet (25 mg total) by mouth daily.  Marland Kitchen HYDROcodone-acetaminophen (NORCO/VICODIN)  5-325 MG tablet Take 1 tablet by mouth every 6 (six) hours as needed for moderate pain.  Marland Kitchen levothyroxine (SYNTHROID) 75 MCG tablet Take 1 tablet (75 mcg total) by mouth daily.  . pantoprazole (PROTONIX) 40 MG tablet Take 1 tablet (40 mg total) by mouth daily.  . pregabalin (LYRICA) 50 MG capsule Take 150 mg by mouth daily.   . Vitamin D, Cholecalciferol, 50 MCG (2000 UT) CAPS Take 2 capsules by mouth daily.  . [DISCONTINUED] calcium-vitamin D (OSCAL WITH D) 500-200 MG-UNIT tablet Take 1 tablet by mouth.  . [DISCONTINUED] diltiazem (TIAZAC) 240 MG 24 hr capsule Take 1 capsule by mouth daily.   No facility-administered encounter medications on file as of 12/01/2019.    Activities of Daily  Living In your present state of health, do you have any difficulty performing the following activities: 12/01/2019  Hearing? N  Comment declines hearing aid  Vision? N  Difficulty concentrating or making decisions? N  Walking or climbing stairs? N  Dressing or bathing? N  Doing errands, shopping? N  Preparing Food and eating ? N  Using the Toilet? N  In the past six months, have you accidently leaked urine? Y  Comment wears pads for protection  Do you have problems with loss of bowel control? N  Managing your Medications? N  Managing your Finances? N  Housekeeping or managing your Housekeeping? N  Some recent data might be hidden    Patient Care Team: Glean Hess, MD as PCP - General (Internal Medicine) Tyler Pita, MD as Referring Physician (Physical Medicine and Rehabilitation) Bayard Hugger, MD as Consulting Physician (Neurosurgery) Loann Quill, MD as Referring Physician (Gastroenterology) Parke Simmers, NP as Nurse Practitioner (Pulmonary Disease)    Assessment:   This is a routine wellness examination for Yanin.  Exercise Activities and Dietary recommendations Current Exercise Habits: The patient does not participate in regular exercise at present, Exercise limited  by: orthopedic condition(s)  Goals    . DIET - INCREASE WATER INTAKE     Recommend to drink at least 6-8 8oz glasses of water per day.    . Increase physical activity     Pt would like to walk more as tolerated with back pain        Fall Risk Fall Risk  12/01/2019 03/31/2019 11/30/2018 08/21/2018 10/23/2017  Falls in the past year? 0 0 1 0 No  Number falls in past yr: 0 0 0 0 -  Injury with Fall? 0 0 0 0 -  Risk for fall due to : Impaired balance/gait History of fall(s) - - History of fall(s)  Risk for fall due to: Comment - - - - trips over dog  Follow up Falls prevention discussed Falls evaluation completed;Falls prevention discussed Falls prevention discussed Falls evaluation completed -   FALL RISK PREVENTION PERTAINING TO THE HOME:  Any stairs in or around the home? Yes  If so, do they handrails? No - 3 steps outside  Home free of loose throw rugs in walkways, pet beds, electrical cords, etc? Yes  Adequate lighting in your home to reduce risk of falls? Yes   ASSISTIVE DEVICES UTILIZED TO PREVENT FALLS:  Life alert? No  Use of a cane, walker or w/c? Yes  Grab bars in the bathroom? No  Shower chair or bench in shower? No  Elevated toilet seat or a handicapped toilet? Yes   DME ORDERS:  DME order needed?  No   TIMED UP AND GO:  Was the test performed? No . Telephonic visit.   Education: Fall risk prevention has been discussed.  Intervention(s) required? No   Depression Screen PHQ 2/9 Scores 12/01/2019 08/26/2019 03/31/2019 11/30/2018  PHQ - 2 Score 0 0 2 0  PHQ- 9 Score - - 5 -     Cognitive Function     6CIT Screen 12/01/2019 11/30/2018 10/23/2017 10/21/2016  What Year? 0 points 0 points 0 points 0 points  What month? 0 points 0 points 0 points 0 points  What time? 0 points 0 points 0 points 0 points  Count back from 20 0 points 0 points 0 points 0 points  Months in reverse 0 points 0 points 0 points 0 points  Repeat phrase 0 points 0 points 0  points 2 points   Total Score 0 0 0 2    Immunization History  Administered Date(s) Administered  . Fluad Quad(high Dose 65+) 03/31/2019  . Influenza, High Dose Seasonal PF 04/22/2017  . Influenza,inj,Quad PF,6+ Mos 04/22/2016  . Influenza-Unspecified 06/05/2018  . PFIZER SARS-COV-2 Vaccination 08/28/2019, 09/19/2019  . Pneumococcal Conjugate-13 09/13/2014  . Pneumococcal Polysaccharide-23 10/03/2004  . Tdap 08/27/2011    Qualifies for Shingles Vaccine? Yes . Due for Shingrix. Education has been provided regarding the importance of this vaccine. Pt has been advised to call insurance company to determine out of pocket expense. Advised may also receive vaccine at local pharmacy or Health Dept. Verbalized acceptance and understanding.  Tdap: Up to date  Flu Vaccine: Up to date  Pneumococcal Vaccine: Up to date  Covid-19 Vaccine: Up to date   Screening Tests Health Maintenance  Topic Date Due  . INFLUENZA VACCINE  03/05/2020  . MAMMOGRAM  10/24/2020  . TETANUS/TDAP  08/26/2021  . DEXA SCAN  Completed  . COVID-19 Vaccine  Completed  . PNA vac Low Risk Adult  Completed    Cancer Screenings:  Colorectal Screening: No longer required.   Mammogram: Completed 10/25/19 [facility], will request records.  Repeat every year.   Bone Density: no longer required.   Lung Cancer Screening: (Low Dose CT Chest recommended if Age 38-80 years, 30 pack-year currently smoking OR have quit w/in 15years.) does not qualify.   Additional Screening:  Hepatitis C Screening: no longer required  Vision Screening: Recommended annual ophthalmology exams for early detection of glaucoma and other disorders of the eye. Is the patient up to date with their annual eye exam?  Yes  Who is the provider or what is the name of the office in which the pt attends annual eye exams? [facility]  Dental Screening: Recommended annual dental exams for proper oral hygiene  Community Resource Referral:  CRR required  this visit?  No      Plan:     I have personally reviewed and addressed the Medicare Annual Wellness questionnaire and have noted the following in the patient's chart:  A. Medical and social history B. Use of alcohol, tobacco or illicit drugs  C. Current medications and supplements D. Functional ability and status E.  Nutritional status F.  Physical activity G. Advance directives H. List of other physicians I.  Hospitalizations, surgeries, and ER visits in previous 12 months J.  [place] such as hearing and vision if needed, cognitive and depression L. Referrals and appointments   In addition, I have reviewed and discussed with patient certain preventive protocols, quality metrics, and best practice recommendations. A written personalized care plan for preventive services as well as general preventive health recommendations were provided to patient.   Signed,  Clemetine Marker, LPN Nurse Health Advisor   Nurse Notes: pt c/o swelling in right leg and foot. She is wearing her compression hose and keeping it elevated and also plans to contact her cardiologist but states it is much better today than recently. Pt also c/o urinary frequency at night going every 1-2 hours to the restroom and has to wear thick pad during the day for occasional incontinence. Pt inquired about possible medication for this or referral to urology. She also states she has added more fiber to her diet, eating a lot of fresh fruits and vegetables which is causing her to have more uncontrollable gas; advised may try OTC gas-x for relief. Please advise patient if you would  like to see her before scheduled appt in July to review these concerns.

## 2019-12-02 DIAGNOSIS — M79604 Pain in right leg: Secondary | ICD-10-CM | POA: Diagnosis not present

## 2019-12-13 DIAGNOSIS — Z79899 Other long term (current) drug therapy: Secondary | ICD-10-CM | POA: Diagnosis not present

## 2019-12-13 DIAGNOSIS — M169 Osteoarthritis of hip, unspecified: Secondary | ICD-10-CM | POA: Diagnosis not present

## 2019-12-13 DIAGNOSIS — G894 Chronic pain syndrome: Secondary | ICD-10-CM | POA: Diagnosis not present

## 2019-12-13 DIAGNOSIS — M533 Sacrococcygeal disorders, not elsewhere classified: Secondary | ICD-10-CM | POA: Diagnosis not present

## 2019-12-13 DIAGNOSIS — M25559 Pain in unspecified hip: Secondary | ICD-10-CM | POA: Diagnosis not present

## 2019-12-13 DIAGNOSIS — M47896 Other spondylosis, lumbar region: Secondary | ICD-10-CM | POA: Diagnosis not present

## 2019-12-13 DIAGNOSIS — M5136 Other intervertebral disc degeneration, lumbar region: Secondary | ICD-10-CM | POA: Diagnosis not present

## 2019-12-13 DIAGNOSIS — M545 Low back pain: Secondary | ICD-10-CM | POA: Diagnosis not present

## 2019-12-13 DIAGNOSIS — Z86018 Personal history of other benign neoplasm: Secondary | ICD-10-CM | POA: Diagnosis not present

## 2019-12-27 ENCOUNTER — Telehealth: Payer: Self-pay | Admitting: Internal Medicine

## 2019-12-27 NOTE — Telephone Encounter (Signed)
Copied from [place] [id number]. Topic: General - Other >> Dec 27, 2019  9:20 AM Keene Breath wrote: Reason for CRM: Patient's cardiologist suggests that patient speak with the nurse or PCP concerning swelling in feet and ankles.  Thinks that patient needs a stronger diuretic to get rid of the fluid.  Patient states that she fell a week ago and she can't drive right now.  If appt. Is needed, she would like a phone visit.  Please advise at CB# [phone]

## 2019-12-27 NOTE — Telephone Encounter (Signed)
She has been in touch with her cardiologist.  One month ago went to ED with swelling.  Did not have a DVT, used compression and elevation and it improved.  10 days ago called cardiology with BP - started low dose amlodipine (which made her swell last year)!   If cardiology wants her to have a higher dose diuretic, they can do that since they are already changing her medication around.  Especially since they added amlodipine.

## 2019-12-28 NOTE — Telephone Encounter (Signed)
Called and spoke with the patient over the phone. Informed Dr. Army Melia wants pt to follow cardiology for htn and diuretic medications.  CM

## 2019-12-29 DIAGNOSIS — S7011XA Contusion of right thigh, initial encounter: Secondary | ICD-10-CM | POA: Diagnosis not present

## 2019-12-29 DIAGNOSIS — W19XXXA Unspecified fall, initial encounter: Secondary | ICD-10-CM | POA: Diagnosis not present

## 2019-12-29 DIAGNOSIS — R0600 Dyspnea, unspecified: Secondary | ICD-10-CM | POA: Diagnosis not present

## 2019-12-29 DIAGNOSIS — S0990XA Unspecified injury of head, initial encounter: Secondary | ICD-10-CM | POA: Diagnosis not present

## 2019-12-29 DIAGNOSIS — E034 Atrophy of thyroid (acquired): Secondary | ICD-10-CM | POA: Diagnosis not present

## 2019-12-29 DIAGNOSIS — R0902 Hypoxemia: Secondary | ICD-10-CM | POA: Diagnosis not present

## 2019-12-29 DIAGNOSIS — M545 Low back pain: Secondary | ICD-10-CM | POA: Diagnosis not present

## 2019-12-29 DIAGNOSIS — I5031 Acute diastolic (congestive) heart failure: Secondary | ICD-10-CM | POA: Diagnosis not present

## 2019-12-29 DIAGNOSIS — I110 Hypertensive heart disease with heart failure: Secondary | ICD-10-CM | POA: Diagnosis not present

## 2019-12-29 DIAGNOSIS — R600 Localized edema: Secondary | ICD-10-CM | POA: Diagnosis not present

## 2019-12-29 DIAGNOSIS — R4182 Altered mental status, unspecified: Secondary | ICD-10-CM | POA: Diagnosis not present

## 2019-12-29 DIAGNOSIS — D689 Coagulation defect, unspecified: Secondary | ICD-10-CM | POA: Diagnosis not present

## 2019-12-29 DIAGNOSIS — R0601 Orthopnea: Secondary | ICD-10-CM | POA: Diagnosis not present

## 2019-12-29 DIAGNOSIS — E871 Hypo-osmolality and hyponatremia: Secondary | ICD-10-CM | POA: Diagnosis not present

## 2019-12-29 DIAGNOSIS — Y92009 Unspecified place in unspecified non-institutional (private) residence as the place of occurrence of the external cause: Secondary | ICD-10-CM | POA: Diagnosis not present

## 2019-12-29 DIAGNOSIS — R0602 Shortness of breath: Secondary | ICD-10-CM | POA: Diagnosis not present

## 2019-12-29 DIAGNOSIS — R079 Chest pain, unspecified: Secondary | ICD-10-CM | POA: Diagnosis not present

## 2019-12-29 DIAGNOSIS — E039 Hypothyroidism, unspecified: Secondary | ICD-10-CM | POA: Diagnosis not present

## 2019-12-29 DIAGNOSIS — Z87891 Personal history of nicotine dependence: Secondary | ICD-10-CM | POA: Diagnosis not present

## 2019-12-30 DIAGNOSIS — G8929 Other chronic pain: Secondary | ICD-10-CM | POA: Diagnosis present

## 2019-12-30 DIAGNOSIS — Z20822 Contact with and (suspected) exposure to covid-19: Secondary | ICD-10-CM | POA: Diagnosis present

## 2019-12-30 DIAGNOSIS — J9601 Acute respiratory failure with hypoxia: Secondary | ICD-10-CM | POA: Diagnosis not present

## 2019-12-30 DIAGNOSIS — R252 Cramp and spasm: Secondary | ICD-10-CM | POA: Diagnosis present

## 2019-12-30 DIAGNOSIS — S8001XA Contusion of right knee, initial encounter: Secondary | ICD-10-CM | POA: Diagnosis present

## 2019-12-30 DIAGNOSIS — R0600 Dyspnea, unspecified: Secondary | ICD-10-CM | POA: Diagnosis not present

## 2019-12-30 DIAGNOSIS — E039 Hypothyroidism, unspecified: Secondary | ICD-10-CM | POA: Diagnosis not present

## 2019-12-30 DIAGNOSIS — R58 Hemorrhage, not elsewhere classified: Secondary | ICD-10-CM | POA: Diagnosis not present

## 2019-12-30 DIAGNOSIS — I5031 Acute diastolic (congestive) heart failure: Secondary | ICD-10-CM | POA: Diagnosis present

## 2019-12-30 DIAGNOSIS — H269 Unspecified cataract: Secondary | ICD-10-CM | POA: Diagnosis present

## 2019-12-30 DIAGNOSIS — Y92009 Unspecified place in unspecified non-institutional (private) residence as the place of occurrence of the external cause: Secondary | ICD-10-CM | POA: Diagnosis not present

## 2019-12-30 DIAGNOSIS — R2689 Other abnormalities of gait and mobility: Secondary | ICD-10-CM | POA: Diagnosis present

## 2019-12-30 DIAGNOSIS — I110 Hypertensive heart disease with heart failure: Secondary | ICD-10-CM | POA: Diagnosis present

## 2019-12-30 DIAGNOSIS — I480 Paroxysmal atrial fibrillation: Secondary | ICD-10-CM | POA: Diagnosis present

## 2019-12-30 DIAGNOSIS — R0902 Hypoxemia: Secondary | ICD-10-CM | POA: Diagnosis present

## 2019-12-30 DIAGNOSIS — R0602 Shortness of breath: Secondary | ICD-10-CM | POA: Diagnosis not present

## 2019-12-30 DIAGNOSIS — D689 Coagulation defect, unspecified: Secondary | ICD-10-CM | POA: Diagnosis present

## 2019-12-30 DIAGNOSIS — H401132 Primary open-angle glaucoma, bilateral, moderate stage: Secondary | ICD-10-CM | POA: Diagnosis present

## 2019-12-30 DIAGNOSIS — E876 Hypokalemia: Secondary | ICD-10-CM | POA: Diagnosis present

## 2019-12-30 DIAGNOSIS — S7011XA Contusion of right thigh, initial encounter: Secondary | ICD-10-CM | POA: Diagnosis present

## 2019-12-30 DIAGNOSIS — K219 Gastro-esophageal reflux disease without esophagitis: Secondary | ICD-10-CM | POA: Diagnosis present

## 2019-12-30 DIAGNOSIS — R350 Frequency of micturition: Secondary | ICD-10-CM | POA: Diagnosis present

## 2019-12-30 DIAGNOSIS — E871 Hypo-osmolality and hyponatremia: Secondary | ICD-10-CM | POA: Diagnosis present

## 2019-12-30 DIAGNOSIS — J45909 Unspecified asthma, uncomplicated: Secondary | ICD-10-CM | POA: Diagnosis present

## 2019-12-30 DIAGNOSIS — M545 Low back pain: Secondary | ICD-10-CM | POA: Diagnosis present

## 2019-12-30 DIAGNOSIS — J42 Unspecified chronic bronchitis: Secondary | ICD-10-CM | POA: Diagnosis present

## 2019-12-30 DIAGNOSIS — W010XXA Fall on same level from slipping, tripping and stumbling without subsequent striking against object, initial encounter: Secondary | ICD-10-CM | POA: Diagnosis not present

## 2019-12-30 DIAGNOSIS — Z87891 Personal history of nicotine dependence: Secondary | ICD-10-CM | POA: Diagnosis not present

## 2019-12-30 DIAGNOSIS — K5909 Other constipation: Secondary | ICD-10-CM | POA: Diagnosis present

## 2019-12-30 DIAGNOSIS — R2681 Unsteadiness on feet: Secondary | ICD-10-CM | POA: Diagnosis present

## 2019-12-30 DIAGNOSIS — E034 Atrophy of thyroid (acquired): Secondary | ICD-10-CM | POA: Diagnosis present

## 2019-12-31 ENCOUNTER — Telehealth: Payer: Self-pay | Admitting: Internal Medicine

## 2019-12-31 NOTE — Telephone Encounter (Signed)
Copied from [place] [id number]. Topic: General - Other >> Dec 31, 2019  4:15 PM Keene Breath wrote: Reason for CRM: Called to verify that Dr. Army Melia would be able to follow for patient's home health svcs.  Please verify and call back with any questions at [phone]

## 2020-01-04 NOTE — Telephone Encounter (Signed)
Called and gave verbal orders to Sarah with [facility].  KP

## 2020-01-15 DIAGNOSIS — R110 Nausea: Secondary | ICD-10-CM | POA: Diagnosis not present

## 2020-01-15 DIAGNOSIS — Z79899 Other long term (current) drug therapy: Secondary | ICD-10-CM | POA: Diagnosis not present

## 2020-01-15 DIAGNOSIS — R0789 Other chest pain: Secondary | ICD-10-CM | POA: Diagnosis not present

## 2020-01-15 DIAGNOSIS — R000 Tachycardia, unspecified: Secondary | ICD-10-CM | POA: Diagnosis not present

## 2020-01-15 DIAGNOSIS — Z87891 Personal history of nicotine dependence: Secondary | ICD-10-CM | POA: Diagnosis not present

## 2020-01-15 DIAGNOSIS — E876 Hypokalemia: Secondary | ICD-10-CM | POA: Diagnosis not present

## 2020-01-15 DIAGNOSIS — R079 Chest pain, unspecified: Secondary | ICD-10-CM | POA: Diagnosis not present

## 2020-01-15 DIAGNOSIS — Z5181 Encounter for therapeutic drug level monitoring: Secondary | ICD-10-CM | POA: Diagnosis not present

## 2020-01-15 DIAGNOSIS — Z7901 Long term (current) use of anticoagulants: Secondary | ICD-10-CM | POA: Diagnosis not present

## 2020-01-18 ENCOUNTER — Ambulatory Visit (INDEPENDENT_AMBULATORY_CARE_PROVIDER_SITE_OTHER): Payer: Medicare Other | Admitting: Internal Medicine

## 2020-01-18 ENCOUNTER — Other Ambulatory Visit: Payer: Self-pay

## 2020-01-18 ENCOUNTER — Encounter: Payer: Self-pay | Admitting: Internal Medicine

## 2020-01-18 VITALS — BP 126/64 | HR 68 | Ht 63.0 in | Wt 156.0 lb

## 2020-01-18 DIAGNOSIS — I10 Essential (primary) hypertension: Secondary | ICD-10-CM | POA: Diagnosis not present

## 2020-01-18 DIAGNOSIS — T148XXA Other injury of unspecified body region, initial encounter: Secondary | ICD-10-CM | POA: Diagnosis not present

## 2020-01-18 DIAGNOSIS — R296 Repeated falls: Secondary | ICD-10-CM | POA: Diagnosis not present

## 2020-01-18 DIAGNOSIS — E876 Hypokalemia: Secondary | ICD-10-CM | POA: Diagnosis not present

## 2020-01-18 DIAGNOSIS — L089 Local infection of the skin and subcutaneous tissue, unspecified: Secondary | ICD-10-CM

## 2020-01-18 DIAGNOSIS — K2100 Gastro-esophageal reflux disease with esophagitis, without bleeding: Secondary | ICD-10-CM

## 2020-01-18 DIAGNOSIS — E034 Atrophy of thyroid (acquired): Secondary | ICD-10-CM

## 2020-01-18 DIAGNOSIS — D6869 Other thrombophilia: Secondary | ICD-10-CM

## 2020-01-18 MED ORDER — CEPHALEXIN 500 MG PO CAPS: 500.0000 mg | ORAL_CAPSULE | Freq: Four times a day (QID) | ORAL | 0 refills | Status: AC

## 2020-01-18 NOTE — Progress Notes (Signed)
Date:  01/18/2020   Name:  Crystal Zavala   DOB:  24-May-1932   MRN:  [id number]   Chief Complaint: Fall (Fall follow up. Patient is with her son Crystal Zavala- POA. ) and Wound Check (X 1 week- leaking and thinks it may be infected. Did not show the hospital so wanted to check with doctor today. ) Admitted to [facility] 12/29/19 to 12/31/19 with hypoxia, fall and closed head injury. She was monitored and then discharged home with [facility]. Result Date: 12/29/2019 Exam: Two view chest Indication: Chest pain. Comparison: 02/22/2019. Technique: Frontal and lateral views of the chest were performed. Findings: Heart size and mediastinal contour unchanged. Atherosclerosis of aortic arch. Chronic atelectasis or scarring of the left lower lobe. Mild apical pleural thickening. No consolidation or pleural effusion. No acute bony abnormality. Increased density of the left costophrenic angle is favored represent confluent/overlapping shadows. It is not redemonstrated on the lateral view. IMPRESSION: No acute abnormalities. Electronically Signed by: Elmore Guise, MD, [facility] Electronically Signed on: 12/29/2019 4:23 PM  X-ray lumbar spine 2 to 3 views  Result Date: 12/29/2019 Lumbar spine two views History: Low back pain after a fall Comparison: Lumbar spine MRI 12/16/2016 Technique: AP and lateral views of the lumbar spine are obtained. Findings: There are 5 nonrib-bearing lumbar vertebral bodies. There is a mild apex right lumbar spinal curvature. There is no spondylolisthesis seen on the lateral view. The lumbar vertebral bodies are normal in height. There is moderate to severe disc space narrowing at L5-S1 and mild to moderate disc space narrowing in the remainder the lumbar spine. Relatively mild vertebral endplate osteophytes are present. There are degenerative changes in the facet joints. Contrast noted in the renal collecting systems, ureters, and bladder from recent intravenous contrast administration. Impression:  1. No acute osseous abnormalities of the lumbar spine. There is multilevel degenerative disc disease and facet arthrosis, with L5-S1 demonstrating the greatest degree of disc space narrowing. Electronically Signed by: Laurena Bering, MD, [facility] Electronically Signed on: 12/29/2019 10:25 PM  CT chest PE protocol incl CT angiogram chest w wo contrast  Result Date: 12/29/2019 EXAM: Chest CTA with contrast - pulmonary artery protocol INDICATION: Dyspnea TECHNIQUE: Helical axial images of the chest were obtained immediately following intravenous contrast administration. Post-processing images were obtained via workstation manipulation to include 3D MIP images. Dose reduction was obtained with Automatic Exposure Control (AEC) or, if AEC could not be utilized, by manual adjustment of the mA and/or kV according to patient size. CONTRAST DOSE: 100 mL Isovue 370 COMPARISON: Chest CT 06/11/2016. FINDINGS: The main pulmonary artery is not dilated. There is no evidence of pulmonary embolus. There is no thoracic aortic aneurysm or dissection. Scattered atherosclerotic calcifications are present in the aortic arch and descending thoracic aorta. There is no pleural or pericardial effusion. There is no mediastinal or hilar lymphadenopathy. The bronchi are patent, although there are mild areas of bronchial wall thickening present. Subtle mosaic attenuation in the upper lobes may be secondary to airways disease. There is no consolidation. Minimal dependent atelectasis is seen in the lower lobes. There is a stable faint 8 mm groundglass nodule in the right lower lobe (series 8 image 319), stable since 2017, favoring a benign process. Stable focus of pleural thickening adjacent to the posterolateral aspect of the basilar left lower lobe. IMPRESSION: 1. No evidence of pulmonary emboli. 2. Mild bronchial wall thickening which could represent mild bronchitis. There is no evidence of pneumonia. Electronically Signed by:  Harrell Gave  Janna Arch, MD, [facility] Radiology Electronically Signed on: 12/29/2019 7:31 PM  Assessment & Plan  Crystal Zavala is a 86 y.o. female admitted for the following problems: Principal Problem: Hypoxia Active Problems: Hypothyroid, unspecified Fall at home, initial encounter  #Hypoxic respiratory insufficiency P/w dyspnea found to have hypoxia on ambulation to 85%. Unclear etiology; differential includes viral bronchitis in setting of possible viral illness (possible developing bronchitis noted on CT), mild volume overload in setting of uncontrolled hypothyroidism. Low suspicion of pneumonia with no fever, normal CXR, normal WBC. CTPE without PE or other pulmonary disease. Endorses "fleeting" moments of chest pressure, ECG without changes and trop negative, no known hx CAD, normal ECG stress test 02/2019. 1+ LE edema but BNP normal and echo 02/2019 normal; low c/f CHF or significant pulmonary HTN. She does have hx asthma but no wheezes on exam, CXR without hyperinflation. COVID test negative, pt fully vaccinated - Will give 20mg  IV lasix and assess response - Repeat O2 walk test in AM - Consider effects of hypothyroidism below - If no response to lasix and no clear etiology established, consider rare causes such as neuromuscular disease, consider outpatient PFTs  #Hypothyroidism Hx hypothyroid thought 2/2 atrophy of thyroid gland, has been on levothyroxine for years. Appears her dose has been stable over past year at 23mcg, however TSH found to be 17.6 at PCP office 08/26/19. Appears that her dose did not change after this and she continues on 66mcg. TSH 25 on admission. Suspect this could be contributing to her symptoms. - Requires levothyroxine dose increase, f/u FT4 values to determine dose  #Hx AF on AC NSR on ECG, denies palpitations.  - Tele - Continue diltiazem and Eloquis  #HTN Continue HCTZ, monitor and replete electrolytes  #Fall #Gait instability Mechanical fall 2 weeks PTA  with mild head trauma, CTH/neck negative for injury. Evaluated by EMS and determined safe to stay at home. Bruising on leg but no e/o trauma requiring attention. Neuro exam normal on admission, however pt was unsteady on her feet during walk test. - PT/OT  Comorbid Conditions: Electrolyte Disorders:  Hyponatremia: Hyponatremia present with lowest sodium of 133. Will continue to monitor.  Hypokalemia: Hypokalemia present with lowest potassium of 3.2. Potassium supplement administered. Will continue to monitor.  Hypomagnesemia: Hypomagnesemia present with lowest magnesium of 1.7. Magnesium supplement administered. Will continue to monitor.  Coagulation Disorders:  Elevated INR: Coagulopathy present with highest INR of 1.7. Will continue to monitor and assess for bleeding complications.   On 01/15/20 she presented to [facility] after a fall.  Her potassium was very low and this was supplemented IV the po. This is an 86 year old with extensive past medical history presenting with somewhat atypical chest pain. She has had a negative CTA and negative nuclear medicine stress test recently. She appears well and her vitals are normal.  EKG NSR nl EKG.   CBC normal white count normal H&H. Chemistries show a sodium of 133 with a potassium of 2.8, normal renal function. PT/INR is 1.8 on Eliquis. Cardiac enzymes and BNP are normal. Chest x-ray no active disease.  Patient has atypical pain with a reassuring initial work-up and a negative recent stress test and CTA certainly placing her at low risk. I have advised her that the primary reason to admit her for today's presentation would be to do a heart cath since anything short of that has been done. She does not want to have a heart catheterization. Her degree of hypokalemia is of concern given her history of  A. fib although she is certainly noted to be in normal sinus rhythm on this visit. IV and oral potassium are ordered. She is on HCTZ which is likely the cause  of this.  Hypertension This is a chronic problem. The problem is controlled. Associated symptoms include shortness of breath. Pertinent negatives include no chest pain, headaches or palpitations. Past treatments include calcium channel blockers (recently stopped amlodipine due to edema).  Wound Check Treated in ED: skin tear on dorsum of right foot. There has been clear discharge from the wound. The redness has not changed. The swelling has not changed. The pain has not changed. There is difficulty moving the extremity or digit due to pain.  Gastroesophageal Reflux She reports no abdominal pain, no chest pain, no coughing or no wheezing. This is a recurrent problem. Pertinent negatives include no fatigue. Risk factors: hx of stricture. She has tried a PPI (and dilatation) for the symptoms. Past procedures include an EGD. and dilatation.    Lab Results  Component Value Date   CREATININE 1.21 (H) 08/26/2019   BUN 28 (H) 08/26/2019   NA 140 08/26/2019   K 4.3 08/26/2019   CL 103 08/26/2019   CO2 25 08/26/2019   Lab Results  Component Value Date   CHOL 240 (H) 08/26/2019   HDL 78 08/26/2019   LDLCALC 147 (H) 08/26/2019   TRIG 85 08/26/2019   CHOLHDL 3.1 08/26/2019   Lab Results  Component Value Date   TSH 17.600 (H) 08/26/2019   Lab Results  Component Value Date   HGBA1C 5.2 04/22/2017   Lab Results  Component Value Date   WBC 10.8 08/26/2019   HGB 14.0 08/26/2019   HCT 42.2 08/26/2019   MCV 95 08/26/2019   PLT 265 08/26/2019   Lab Results  Component Value Date   ALT 13 08/26/2019   AST 14 08/26/2019   ALKPHOS 104 08/26/2019   BILITOT 0.5 08/26/2019     Review of Systems  Constitutional: Positive for appetite change. Negative for fatigue, fever and unexpected weight change.  HENT: Negative for tinnitus and trouble swallowing (feels like food sticks in upper esophagus ).   Eyes: Negative for visual disturbance.  Respiratory: Positive for shortness of breath.  Negative for cough, chest tightness and wheezing.   Cardiovascular: Positive for leg swelling. Negative for chest pain and palpitations.  Gastrointestinal: Negative for abdominal pain, blood in stool, constipation, diarrhea and vomiting (has not had to regurgitate food).  Genitourinary: Negative for dysuria and hematuria.  Musculoskeletal: Positive for gait problem (uses rolator for ambulation). Negative for arthralgias.  Skin: Positive for color change and wound.  Neurological: Positive for weakness. Negative for dizziness, tremors, numbness and headaches.  Hematological: Bruises/bleeds easily.  Psychiatric/Behavioral: Negative for dysphoric mood and sleep disturbance.    Patient Active Problem List   Diagnosis Date Noted  . Paroxysmal atrial fibrillation ([place]) 02/23/2019  . Allergy to tramadol 09/03/2018  . Pain of both hip joints 04/22/2017  . Renal insufficiency 01/05/2017  . 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 Spondylosis of lumbar region without myelopathy or radiculopathy 10/21/2016  . Hypothyroidism due to acquired atrophy of thyroid 04/22/2016  . Insomnia 04/22/2016  . DDD (degenerative disc disease), lumbar 04/22/2016  . Breast mass, right 01/10/2016  . Aortic atherosclerosis ([place]) 01/18/2015  . Arthritis of neck 01/18/2015  . Dermatitis, eczematoid 01/18/2015  . Essential (primary) hypertension 01/18/2015  . Esophagitis, reflux 01/18/2015  . H/O adenomatous polyp of colon 01/18/2015  .  Migraine without aura and responsive to treatment 01/18/2015  . Asthma, mild intermittent 01/18/2015  . Hyperlipidemia, mild 01/18/2015  . Lung nodule, multiple 01/18/2015  . Kidney cysts 01/18/2015  . Tobacco use disorder, moderate, in sustained remission 01/18/2015  . Temporary cerebral vascular dysfunction 01/18/2015    Allergies  Allergen Reactions  . Amlodipine Swelling  . Tramadol Nausea Only and Rash  . Ace Inhibitors Cough    Past  Surgical History:  Procedure Laterality Date  . ABDOMINAL HYSTERECTOMY    . BLEPHAROPLASTY Bilateral 2013  . BREAST BIOPSY Left 2009   benign  . COLONOSCOPY  2006   Normal  . ESI  01/2017   L5-S1 @ [facility]  . ESOPHAGOGASTRODUODENOSCOPY  2015   Dilation of esophageal stricture  . TOTAL HIP ARTHROPLASTY Right 2019    Social History   Tobacco Use  . Smoking status: Former Smoker    Packs/day: 0.50    Years: 39.00    Pack years: 19.50    Types: Cigarettes    Quit date: 1992    Years since quitting: 29.4  . Smokeless tobacco: Never Used  . Tobacco comment: smoking cessation materails not required  Vaping Use  . Vaping Use: Never used  Substance Use Topics  . Alcohol use: No    Alcohol/week: 0.0 standard drinks  . Drug use: No     Medication list has been reviewed and updated.  Current Meds  Medication Sig  . albuterol (VENTOLIN HFA) 108 (90 Base) MCG/ACT inhaler Inhale into the lungs every 6 (six) hours as needed for wheezing or shortness of breath.  . cetirizine (ZYRTEC) 10 MG tablet Take 10 mg by mouth daily.  Marland Kitchen diltiazem (TIAZAC) 120 MG 24 hr capsule Take 120 mg by mouth daily.  Marland Kitchen ELIQUIS 5 MG TABS tablet Take 5 mg by mouth 2 (two) times daily.  . Fluticasone-Salmeterol (ADVAIR DISKUS) 250-50 MCG/DOSE AEPB Inhale 1 puff into the lungs 2 (two) times daily.  . hydrochlorothiazide (HYDRODIURIL) 25 MG tablet Take 1 tablet (25 mg total) by mouth daily.  Marland Kitchen HYDROcodone-acetaminophen (NORCO/VICODIN) 5-325 MG tablet Take 1 tablet by mouth every 6 (six) hours as needed for moderate pain.  Marland Kitchen levothyroxine (SYNTHROID) 88 MCG tablet Take 88 mcg by mouth daily before breakfast. Monday through Saturday  And take 1/2 tablet on Sundays.  . nortriptyline (PAMELOR) 10 MG capsule Take 10 mg by mouth at bedtime. 1 cap by mouth 1 hour before bedtime Then 2 caps by mouth 1 hour before bedtime.  . ondansetron (ZOFRAN) 4 MG tablet Take 4 mg by mouth 2 (two) times daily.  . pantoprazole  (PROTONIX) 40 MG tablet Take 1 tablet (40 mg total) by mouth daily.  . potassium chloride 20 MEQ/15ML (10%) SOLN Take 10 mEq by mouth. 15 ml/ 20 meg daily  . Vitamin D, Cholecalciferol, 50 MCG (2000 UT) CAPS Take 2 capsules by mouth daily.    PHQ 2/9 Scores 01/18/2020 12/01/2019 08/26/2019 03/31/2019  PHQ - 2 Score 0 0 0 2  PHQ- 9 Score 6 - - 5    GAD 7 : Generalized Anxiety Score 01/18/2020  Nervous, Anxious, on Edge 1  Control/stop worrying 3  Worry too much - different things 3  Trouble relaxing 0  Restless 0  Easily annoyed or irritable 1  Afraid - awful might happen 0  Total GAD 7 Score 8  Anxiety Difficulty Not difficult at all    BP Readings from Last 3 Encounters:  01/18/20 126/64  08/26/19 116/74  03/31/19 118/82    Physical Exam Vitals and nursing note reviewed.  Constitutional:      General: She is not in acute distress.    Appearance: Normal appearance. She is well-developed.  HENT:     Head: Normocephalic and atraumatic.  Cardiovascular:     Rate and Rhythm: Normal rate and regular rhythm.     Pulses: Normal pulses.  Pulmonary:     Effort: Pulmonary effort is normal. No respiratory distress.     Breath sounds: No wheezing or rhonchi.  Musculoskeletal:     Cervical back: Normal range of motion.     Right lower leg: Edema present.     Left lower leg: Edema present.  Lymphadenopathy:     Cervical: No cervical adenopathy.  Skin:    General: Skin is warm and dry.     Capillary Refill: Capillary refill takes less than 2 seconds.     Findings: Wound present. No rash.     Comments: 1x1 inch skin tear on dorsum of right foot; mild surrounding erythema, mild serous drainage secondary to edema  Neurological:     General: No focal deficit present.     Mental Status: She is alert and oriented to person, place, and time.     Gait: Gait abnormal.  Psychiatric:        Attention and Perception: Attention normal.        Mood and Affect: Mood normal.        Speech:  Speech normal.     Wt Readings from Last 3 Encounters:  01/18/20 156 lb (70.8 kg)  08/26/19 163 lb (73.9 kg)  03/31/19 165 lb (74.8 kg)    BP 126/64 (BP Location: Left Wrist, Patient Position: Sitting, Cuff Size: Normal)   Pulse 68   Ht 5\' 3"  (1.6 m)   Wt 156 lb (70.8 kg)   SpO2 95%   BMI 27.63 kg/m   Assessment and Plan: 1. Hypokalemia Continue oral liquid supplement twice a day Recheck labs today - Basic metabolic panel  2. Infected skin tear Continue local care with TAO, gauze  Elevate several hours per day "toes above the nose" - cephALEXin (KEFLEX) 500 MG capsule; Take 1 capsule (500 mg total) by mouth 4 (four) times daily for 10 days.  Dispense: 40 capsule; Refill: 0  3. Essential (primary) hypertension Clinically stable exam with well controlled BP on cardiazem. Amlodipine stopped due to edema Tolerating medications without side effects at this time. Pt to continue current regimen and low sodium diet; benefits of regular exercise as able discussed.  4. Falls frequently - Ambulatory referral to [place]  5. Gastroesophageal reflux disease with esophagitis, unspecified whether hemorrhage Continue PPI Concern for recurrence of stricture with dysphagia symptoms per patient Try ensure or similar supplement since until appetite returns  6. Hypothyroidism due to acquired atrophy of thyroid Dose changed in the hospital in May Repeat labs this week were much improved Recommend she follow up with Endo for labs and refills/dose adjustments  7. Acquired thrombophilia ([place]) On Eliquis for Afib No bleeding issues noted  Partially dictated using Editor, commissioning. Any errors are unintentional.  Halina Maidens, MD [facility]  01/18/2020

## 2020-01-19 LAB — BASIC METABOLIC PANEL
BUN/Creatinine Ratio: 17 (ref 12–28)
BUN: 16 mg/dL (ref 8–27)
CO2: 27 mmol/L (ref 20–29)
Calcium: 11.3 mg/dL — ABNORMAL HIGH (ref 8.7–10.3)
Chloride: 94 mmol/L — ABNORMAL LOW (ref 96–106)
Creatinine, Ser: 0.94 mg/dL (ref 0.57–1.00)
GFR calc Af Amer: 64 mL/min/{1.73_m2} (ref >59)
GFR calc non Af Amer: 55 mL/min/{1.73_m2} — ABNORMAL LOW (ref >59)
Glucose: 102 mg/dL — ABNORMAL HIGH (ref 65–99)
Potassium: 4 mmol/L (ref 3.5–5.2)
Sodium: 135 mmol/L (ref 134–144)

## 2020-01-20 DIAGNOSIS — J4520 Mild intermittent asthma, uncomplicated: Secondary | ICD-10-CM | POA: Diagnosis not present

## 2020-01-20 DIAGNOSIS — E871 Hypo-osmolality and hyponatremia: Secondary | ICD-10-CM | POA: Diagnosis not present

## 2020-01-20 DIAGNOSIS — D689 Coagulation defect, unspecified: Secondary | ICD-10-CM | POA: Diagnosis not present

## 2020-01-20 DIAGNOSIS — L309 Dermatitis, unspecified: Secondary | ICD-10-CM | POA: Diagnosis not present

## 2020-01-20 DIAGNOSIS — Z8603 Personal history of neoplasm of uncertain behavior: Secondary | ICD-10-CM | POA: Diagnosis not present

## 2020-01-20 DIAGNOSIS — Z9181 History of falling: Secondary | ICD-10-CM | POA: Diagnosis not present

## 2020-01-20 DIAGNOSIS — R918 Other nonspecific abnormal finding of lung field: Secondary | ICD-10-CM | POA: Diagnosis not present

## 2020-01-20 DIAGNOSIS — E034 Atrophy of thyroid (acquired): Secondary | ICD-10-CM | POA: Diagnosis not present

## 2020-01-20 DIAGNOSIS — F17211 Nicotine dependence, cigarettes, in remission: Secondary | ICD-10-CM | POA: Diagnosis not present

## 2020-01-20 DIAGNOSIS — G43909 Migraine, unspecified, not intractable, without status migrainosus: Secondary | ICD-10-CM | POA: Diagnosis not present

## 2020-01-20 DIAGNOSIS — S91311D Laceration without foreign body, right foot, subsequent encounter: Secondary | ICD-10-CM | POA: Diagnosis not present

## 2020-01-20 DIAGNOSIS — I700 Atherosclerosis of aorta: Secondary | ICD-10-CM | POA: Diagnosis not present

## 2020-01-20 DIAGNOSIS — M47816 Spondylosis without myelopathy or radiculopathy, lumbar region: Secondary | ICD-10-CM | POA: Diagnosis not present

## 2020-01-20 DIAGNOSIS — I10 Essential (primary) hypertension: Secondary | ICD-10-CM | POA: Diagnosis not present

## 2020-01-20 DIAGNOSIS — K2100 Gastro-esophageal reflux disease with esophagitis, without bleeding: Secondary | ICD-10-CM | POA: Diagnosis not present

## 2020-01-20 DIAGNOSIS — E876 Hypokalemia: Secondary | ICD-10-CM | POA: Diagnosis not present

## 2020-01-20 DIAGNOSIS — I480 Paroxysmal atrial fibrillation: Secondary | ICD-10-CM | POA: Diagnosis not present

## 2020-01-20 DIAGNOSIS — R296 Repeated falls: Secondary | ICD-10-CM | POA: Diagnosis not present

## 2020-01-20 DIAGNOSIS — N289 Disorder of kidney and ureter, unspecified: Secondary | ICD-10-CM | POA: Diagnosis not present

## 2020-01-20 DIAGNOSIS — G4700 Insomnia, unspecified: Secondary | ICD-10-CM | POA: Diagnosis not present

## 2020-01-20 DIAGNOSIS — M47812 Spondylosis without myelopathy or radiculopathy, cervical region: Secondary | ICD-10-CM | POA: Diagnosis not present

## 2020-01-20 DIAGNOSIS — N281 Cyst of kidney, acquired: Secondary | ICD-10-CM | POA: Diagnosis not present

## 2020-01-20 DIAGNOSIS — E785 Hyperlipidemia, unspecified: Secondary | ICD-10-CM | POA: Diagnosis not present

## 2020-01-20 DIAGNOSIS — M5137 Other intervertebral disc degeneration, lumbosacral region: Secondary | ICD-10-CM | POA: Diagnosis not present

## 2020-01-21 ENCOUNTER — Telehealth: Payer: Self-pay | Admitting: Internal Medicine

## 2020-01-21 DIAGNOSIS — E871 Hypo-osmolality and hyponatremia: Secondary | ICD-10-CM | POA: Diagnosis not present

## 2020-01-21 DIAGNOSIS — I10 Essential (primary) hypertension: Secondary | ICD-10-CM | POA: Diagnosis not present

## 2020-01-21 DIAGNOSIS — E876 Hypokalemia: Secondary | ICD-10-CM | POA: Diagnosis not present

## 2020-01-21 DIAGNOSIS — S91311D Laceration without foreign body, right foot, subsequent encounter: Secondary | ICD-10-CM | POA: Diagnosis not present

## 2020-01-21 DIAGNOSIS — R296 Repeated falls: Secondary | ICD-10-CM | POA: Diagnosis not present

## 2020-01-21 NOTE — Telephone Encounter (Signed)
Pt was admitted yesterday to home health with PT/ but care giver stated to Marlowe Kays that the Pt is very uncomfortable and possible impacted and hasnt had a bowel movement in 3 days/Pt has taken laxatives and softeners/ Marlowe Kays thinks she needs her rectal area stimulated and wanted to see if its ok if the care giver can pick up some suppositories or inomas

## 2020-01-21 NOTE — Telephone Encounter (Signed)
She can try glycerin suppositories first; if no benefit then can try dulcolax suppositories.

## 2020-01-21 NOTE — Telephone Encounter (Signed)
Marlowe Kays informed of Dr Gaspar Cola instructions.  CM

## 2020-01-22 ENCOUNTER — Other Ambulatory Visit: Payer: Self-pay | Admitting: Internal Medicine

## 2020-01-22 DIAGNOSIS — E034 Atrophy of thyroid (acquired): Secondary | ICD-10-CM

## 2020-01-22 DIAGNOSIS — I10 Essential (primary) hypertension: Secondary | ICD-10-CM

## 2020-01-22 DIAGNOSIS — K2100 Gastro-esophageal reflux disease with esophagitis, without bleeding: Secondary | ICD-10-CM

## 2020-01-22 NOTE — Telephone Encounter (Signed)
Requested Prescriptions  Pending Prescriptions Disp Refills   hydrochlorothiazide (HYDRODIURIL) 25 MG tablet [Pharmacy Med Name: HYDROCHLOROT TAB 25MG ] 90 tablet 1    Sig: TAKE 1 TABLET DAILY     Cardiovascular: Diuretics - Thiazide Failed - 01/22/2020  4:05 PM      Failed - Ca in normal range and within 360 days    Calcium  Date Value Ref Range Status  01/18/2020 11.3 (H) 8.7 - 10.3 mg/dL Final         Passed - Cr in normal range and within 360 days    Creatinine, Ser  Date Value Ref Range Status  01/18/2020 0.94 0.57 - 1.00 mg/dL Final         Passed - K in normal range and within 360 days    Potassium  Date Value Ref Range Status  01/18/2020 4.0 3.5 - 5.2 mmol/L Final         Passed - Na in normal range and within 360 days    Sodium  Date Value Ref Range Status  01/18/2020 135 134 - 144 mmol/L Final         Passed - Last BP in normal range    BP Readings from Last 1 Encounters:  01/18/20 126/64         Passed - Valid encounter within last 6 months    Recent Outpatient Visits          4 days ago Hypokalemia   [facility] Glean Hess, MD   4 months ago Essential (primary) hypertension   [facility] Glean Hess, MD   9 months ago Hypothyroidism due to acquired atrophy of thyroid   [facility] Glean Hess, MD   1 year ago Spondylosis of lumbar region without myelopathy or radiculopathy   [facility] Glean Hess, MD   1 year ago Essential (primary) hypertension   [place], Laura H, MD      Future Appointments            In 2 weeks Army Melia Jesse Sans, MD [facility], [place]   In 7 months Army Melia Jesse Sans, MD [facility]            levothyroxine (SYNTHROID) 75 MCG tablet [Pharmacy Med Name: LEVOTHYROXIN TAB 0.075MG ] 90 tablet     Sig: TAKE 1 TABLET DAILY     Endocrinology:  Hypothyroid Agents Failed - 01/22/2020  4:05 PM      Failed - TSH needs to be  rechecked within 3 months after an abnormal result. Refill until TSH is due.      Failed - TSH in normal range and within 360 days    TSH  Date Value Ref Range Status  08/26/2019 17.600 (H) 0.450 - 4.500 uIU/mL Final         Passed - Valid encounter within last 12 months    Recent Outpatient Visits          4 days ago Hypokalemia   [facility] Glean Hess, MD   4 months ago Essential (primary) hypertension   [facility] Glean Hess, MD   9 months ago Hypothyroidism due to acquired atrophy of thyroid   [facility] Glean Hess, MD   1 year ago Spondylosis of lumbar region without myelopathy or radiculopathy   [facility] Glean Hess, MD   1 year ago Essential (primary) hypertension   [facility] Glean Hess, MD      Future Appointments            In 2 weeks Army Melia Jesse Sans, MD [facility], [place]   In 7 months Army Melia Jesse Sans, MD [facility]            pantoprazole ([place]) 40 MG tablet [Pharmacy Med Name: PANTOPRAZOLE TAB 40MG  DR] 90 tablet 1    Sig: TAKE 1 TABLET DAILY     Gastroenterology: Proton Pump Inhibitors Passed - 01/22/2020  4:05 PM      Passed - Valid encounter within last 12 months    Recent Outpatient Visits          4 days ago Hypokalemia   [facility] Glean Hess, MD   4 months ago Essential (primary) hypertension   [facility] Glean Hess, MD   9 months ago Hypothyroidism due to acquired atrophy of thyroid   [facility] Glean Hess, MD   1 year ago Spondylosis of lumbar region without myelopathy or radiculopathy   [facility] Glean Hess, MD   1 year ago Essential (primary) hypertension   [facility] Glean Hess, MD      Future Appointments            In 2 weeks Army Melia Jesse Sans, MD [facility], [place]   In 7 months Army Melia Jesse Sans, MD [facility], [facility]

## 2020-01-25 DIAGNOSIS — R079 Chest pain, unspecified: Secondary | ICD-10-CM | POA: Diagnosis not present

## 2020-01-25 DIAGNOSIS — E860 Dehydration: Secondary | ICD-10-CM | POA: Diagnosis not present

## 2020-01-25 DIAGNOSIS — I10 Essential (primary) hypertension: Secondary | ICD-10-CM | POA: Diagnosis not present

## 2020-01-25 DIAGNOSIS — S91311D Laceration without foreign body, right foot, subsequent encounter: Secondary | ICD-10-CM | POA: Diagnosis not present

## 2020-01-25 DIAGNOSIS — I471 Supraventricular tachycardia: Secondary | ICD-10-CM | POA: Diagnosis not present

## 2020-01-25 DIAGNOSIS — R0789 Other chest pain: Secondary | ICD-10-CM | POA: Diagnosis not present

## 2020-01-25 DIAGNOSIS — E876 Hypokalemia: Secondary | ICD-10-CM | POA: Diagnosis not present

## 2020-01-25 DIAGNOSIS — R0902 Hypoxemia: Secondary | ICD-10-CM | POA: Diagnosis not present

## 2020-01-25 DIAGNOSIS — E871 Hypo-osmolality and hyponatremia: Secondary | ICD-10-CM | POA: Diagnosis not present

## 2020-01-25 DIAGNOSIS — R296 Repeated falls: Secondary | ICD-10-CM | POA: Diagnosis not present

## 2020-01-26 ENCOUNTER — Telehealth: Payer: Self-pay | Admitting: Internal Medicine

## 2020-01-26 NOTE — Telephone Encounter (Signed)
Crystal Zavala with [facility] states he had to call EMS yesterday when he got to pt's home. Pt's resting HR was 155, and she was complaining of chest pain. Pt refused to go to the hospital with EMS.  They started an IV, and Gerald Stabs states he had to leave because he had other clients to see.  He is not sure if she went to hospital.  cb [phone]

## 2020-01-26 NOTE — Telephone Encounter (Signed)
Called pt son [facility]) made him aware of what happened. Told him that we would recommenced her to go to the hospital. Pt son stated that he knew what was going on and that he will try to get her to go to the hospital.   KP

## 2020-01-27 DIAGNOSIS — E871 Hypo-osmolality and hyponatremia: Secondary | ICD-10-CM | POA: Diagnosis not present

## 2020-01-27 DIAGNOSIS — I10 Essential (primary) hypertension: Secondary | ICD-10-CM | POA: Diagnosis not present

## 2020-01-27 DIAGNOSIS — E876 Hypokalemia: Secondary | ICD-10-CM | POA: Diagnosis not present

## 2020-01-27 DIAGNOSIS — R296 Repeated falls: Secondary | ICD-10-CM | POA: Diagnosis not present

## 2020-01-27 DIAGNOSIS — S91311D Laceration without foreign body, right foot, subsequent encounter: Secondary | ICD-10-CM | POA: Diagnosis not present

## 2020-01-28 DIAGNOSIS — E876 Hypokalemia: Secondary | ICD-10-CM | POA: Diagnosis not present

## 2020-01-28 DIAGNOSIS — S91311D Laceration without foreign body, right foot, subsequent encounter: Secondary | ICD-10-CM | POA: Diagnosis not present

## 2020-01-28 DIAGNOSIS — E039 Hypothyroidism, unspecified: Secondary | ICD-10-CM | POA: Diagnosis not present

## 2020-01-28 DIAGNOSIS — I10 Essential (primary) hypertension: Secondary | ICD-10-CM | POA: Diagnosis not present

## 2020-01-28 DIAGNOSIS — R296 Repeated falls: Secondary | ICD-10-CM | POA: Diagnosis not present

## 2020-01-28 DIAGNOSIS — E871 Hypo-osmolality and hyponatremia: Secondary | ICD-10-CM | POA: Diagnosis not present

## 2020-01-28 DIAGNOSIS — L97511 Non-pressure chronic ulcer of other part of right foot limited to breakdown of skin: Secondary | ICD-10-CM | POA: Diagnosis not present

## 2020-01-31 ENCOUNTER — Other Ambulatory Visit: Payer: Self-pay | Admitting: Internal Medicine

## 2020-01-31 DIAGNOSIS — I10 Essential (primary) hypertension: Secondary | ICD-10-CM

## 2020-01-31 DIAGNOSIS — L309 Dermatitis, unspecified: Secondary | ICD-10-CM

## 2020-01-31 DIAGNOSIS — R296 Repeated falls: Secondary | ICD-10-CM

## 2020-01-31 DIAGNOSIS — G43009 Migraine without aura, not intractable, without status migrainosus: Secondary | ICD-10-CM

## 2020-01-31 DIAGNOSIS — E034 Atrophy of thyroid (acquired): Secondary | ICD-10-CM

## 2020-01-31 DIAGNOSIS — J4520 Mild intermittent asthma, uncomplicated: Secondary | ICD-10-CM

## 2020-01-31 DIAGNOSIS — K2100 Gastro-esophageal reflux disease with esophagitis, without bleeding: Secondary | ICD-10-CM

## 2020-01-31 DIAGNOSIS — M5136 Other intervertebral disc degeneration, lumbar region: Secondary | ICD-10-CM

## 2020-01-31 DIAGNOSIS — F5101 Primary insomnia: Secondary | ICD-10-CM

## 2020-01-31 DIAGNOSIS — E871 Hypo-osmolality and hyponatremia: Secondary | ICD-10-CM | POA: Diagnosis not present

## 2020-01-31 DIAGNOSIS — E876 Hypokalemia: Secondary | ICD-10-CM | POA: Diagnosis not present

## 2020-01-31 DIAGNOSIS — D6869 Other thrombophilia: Secondary | ICD-10-CM

## 2020-01-31 DIAGNOSIS — S91311D Laceration without foreign body, right foot, subsequent encounter: Secondary | ICD-10-CM | POA: Diagnosis not present

## 2020-01-31 DIAGNOSIS — M47816 Spondylosis without myelopathy or radiculopathy, lumbar region: Secondary | ICD-10-CM

## 2020-01-31 DIAGNOSIS — I700 Atherosclerosis of aorta: Secondary | ICD-10-CM

## 2020-01-31 DIAGNOSIS — N281 Cyst of kidney, acquired: Secondary | ICD-10-CM

## 2020-01-31 DIAGNOSIS — E785 Hyperlipidemia, unspecified: Secondary | ICD-10-CM

## 2020-01-31 DIAGNOSIS — I480 Paroxysmal atrial fibrillation: Secondary | ICD-10-CM

## 2020-01-31 DIAGNOSIS — F17201 Nicotine dependence, unspecified, in remission: Secondary | ICD-10-CM

## 2020-01-31 NOTE — Progress Notes (Signed)
Received orders from [facility]. Start of care 01/20/20.   Orders from 01/20/20 through 03/19/20. are reviewed, signed and faxed.

## 2020-02-01 DIAGNOSIS — R197 Diarrhea, unspecified: Secondary | ICD-10-CM | POA: Diagnosis not present

## 2020-02-01 DIAGNOSIS — E876 Hypokalemia: Secondary | ICD-10-CM | POA: Diagnosis not present

## 2020-02-01 DIAGNOSIS — R6889 Other general symptoms and signs: Secondary | ICD-10-CM | POA: Insufficient documentation

## 2020-02-01 DIAGNOSIS — L0390 Cellulitis, unspecified: Secondary | ICD-10-CM | POA: Diagnosis not present

## 2020-02-01 DIAGNOSIS — L03119 Cellulitis of unspecified part of limb: Secondary | ICD-10-CM | POA: Diagnosis not present

## 2020-02-04 ENCOUNTER — Telehealth: Payer: Self-pay | Admitting: Internal Medicine

## 2020-02-04 DIAGNOSIS — R1310 Dysphagia, unspecified: Secondary | ICD-10-CM | POA: Diagnosis not present

## 2020-02-04 NOTE — Telephone Encounter (Signed)
Called patient son and POA Crystal Zavala. He was upset saying that he is trying to figure out where the ball had been dropped for his mother. He said she had labs done with Endocrinology on 01/28/2020 and her potassium, and sodium was low along with her thyroid being abnormal. He was told by [facility] ENDO that they referred the labs to [name] for review and the ball was left in our court to call the pt about labs.  Reviewed pts chart. She is currently in the hospital, but doing better. Read off the lab comparison from 01/28/20 Endo visit to today and he see's that the labs dropped more after her Endo visit.   Called Endo and left a VM at their office just to make sure there is no further recommendation for the patient with her thyroid.   Informed Timothy. He thanked me.   CM

## 2020-02-04 NOTE — Telephone Encounter (Unsigned)
Copied from [place] [id number]. Topic: General - Other >> Feb 04, 2020  8:22 AM Celene Kras wrote: Reason for CRM: Pts son called stating that the pt is in the hospital again. He states that there was a mix up in pts blood work with endocrinologist. He is requesting to have a call back from PCP regarding her blood work. Please advise.

## 2020-02-08 ENCOUNTER — Telehealth: Payer: Self-pay | Admitting: Internal Medicine

## 2020-02-08 DIAGNOSIS — I10 Essential (primary) hypertension: Secondary | ICD-10-CM | POA: Diagnosis not present

## 2020-02-08 DIAGNOSIS — R296 Repeated falls: Secondary | ICD-10-CM | POA: Diagnosis not present

## 2020-02-08 DIAGNOSIS — E871 Hypo-osmolality and hyponatremia: Secondary | ICD-10-CM | POA: Diagnosis not present

## 2020-02-08 DIAGNOSIS — S91311D Laceration without foreign body, right foot, subsequent encounter: Secondary | ICD-10-CM | POA: Diagnosis not present

## 2020-02-08 DIAGNOSIS — E876 Hypokalemia: Secondary | ICD-10-CM | POA: Diagnosis not present

## 2020-02-08 NOTE — Telephone Encounter (Unsigned)
Copied from [place] [phone]. Topic: General - Inquiry >> Feb 08, 2020  4:35 PM Alease Frame wrote: Reason for CRM: Ashok Croon from [facility] called in states she needs verbal order to re admit pt  to home care. She was recently in hospital due to a potassium being to high . Please advise   call back [phone]

## 2020-02-09 ENCOUNTER — Telehealth: Payer: Self-pay | Admitting: Internal Medicine

## 2020-02-09 ENCOUNTER — Ambulatory Visit: Payer: Medicare Other | Admitting: Internal Medicine

## 2020-02-09 DIAGNOSIS — I10 Essential (primary) hypertension: Secondary | ICD-10-CM | POA: Diagnosis not present

## 2020-02-09 DIAGNOSIS — S91311D Laceration without foreign body, right foot, subsequent encounter: Secondary | ICD-10-CM | POA: Diagnosis not present

## 2020-02-09 DIAGNOSIS — E871 Hypo-osmolality and hyponatremia: Secondary | ICD-10-CM | POA: Diagnosis not present

## 2020-02-09 DIAGNOSIS — E876 Hypokalemia: Secondary | ICD-10-CM | POA: Diagnosis not present

## 2020-02-09 DIAGNOSIS — R296 Repeated falls: Secondary | ICD-10-CM | POA: Diagnosis not present

## 2020-02-09 NOTE — Telephone Encounter (Unsigned)
Copied from [place] [phone]. Topic: General - Call Back - No Documentation >> Feb 09, 2020  2:18 PM Jaynie Collins D wrote: Reason for CRM: Patient is requesting " Crystal Zavala" nurse to please call her backk, patient has a bump on head due to fall and PT advised patient today to contact her PCP to let them know how she should move forward.

## 2020-02-09 NOTE — Telephone Encounter (Signed)
Called Ashok Croon from [facility]  left VM giving verbal orders. Leigh VM stated her name.  KP

## 2020-02-09 NOTE — Telephone Encounter (Signed)
Called pt back she is not experiencing any symptoms at this time she said she feels fine and that there is no open wounds only bruising pt stated she fell Friday 02/04/2020. Also said that her legs was swelling I told pt to keep her legs propped up. Pt verbalized understanding.  KP

## 2020-02-14 DIAGNOSIS — E876 Hypokalemia: Secondary | ICD-10-CM | POA: Diagnosis not present

## 2020-02-14 DIAGNOSIS — E871 Hypo-osmolality and hyponatremia: Secondary | ICD-10-CM | POA: Diagnosis not present

## 2020-02-14 DIAGNOSIS — R296 Repeated falls: Secondary | ICD-10-CM | POA: Diagnosis not present

## 2020-02-14 DIAGNOSIS — I10 Essential (primary) hypertension: Secondary | ICD-10-CM | POA: Diagnosis not present

## 2020-02-14 DIAGNOSIS — S91311D Laceration without foreign body, right foot, subsequent encounter: Secondary | ICD-10-CM | POA: Diagnosis not present

## 2020-02-15 DIAGNOSIS — E871 Hypo-osmolality and hyponatremia: Secondary | ICD-10-CM | POA: Diagnosis not present

## 2020-02-15 DIAGNOSIS — E876 Hypokalemia: Secondary | ICD-10-CM | POA: Diagnosis not present

## 2020-02-15 DIAGNOSIS — R296 Repeated falls: Secondary | ICD-10-CM | POA: Diagnosis not present

## 2020-02-15 DIAGNOSIS — S91311D Laceration without foreign body, right foot, subsequent encounter: Secondary | ICD-10-CM | POA: Diagnosis not present

## 2020-02-15 DIAGNOSIS — I10 Essential (primary) hypertension: Secondary | ICD-10-CM | POA: Diagnosis not present

## 2020-02-17 DIAGNOSIS — E871 Hypo-osmolality and hyponatremia: Secondary | ICD-10-CM | POA: Diagnosis not present

## 2020-02-17 DIAGNOSIS — E876 Hypokalemia: Secondary | ICD-10-CM | POA: Diagnosis not present

## 2020-02-17 DIAGNOSIS — S91311D Laceration without foreign body, right foot, subsequent encounter: Secondary | ICD-10-CM | POA: Diagnosis not present

## 2020-02-17 DIAGNOSIS — I10 Essential (primary) hypertension: Secondary | ICD-10-CM | POA: Diagnosis not present

## 2020-02-17 DIAGNOSIS — R296 Repeated falls: Secondary | ICD-10-CM | POA: Diagnosis not present

## 2020-02-18 DIAGNOSIS — I10 Essential (primary) hypertension: Secondary | ICD-10-CM | POA: Diagnosis not present

## 2020-02-18 DIAGNOSIS — E876 Hypokalemia: Secondary | ICD-10-CM | POA: Diagnosis not present

## 2020-02-18 DIAGNOSIS — S91311D Laceration without foreign body, right foot, subsequent encounter: Secondary | ICD-10-CM | POA: Diagnosis not present

## 2020-02-18 DIAGNOSIS — E871 Hypo-osmolality and hyponatremia: Secondary | ICD-10-CM | POA: Diagnosis not present

## 2020-02-18 DIAGNOSIS — R296 Repeated falls: Secondary | ICD-10-CM | POA: Diagnosis not present

## 2020-02-19 DIAGNOSIS — G43909 Migraine, unspecified, not intractable, without status migrainosus: Secondary | ICD-10-CM | POA: Diagnosis not present

## 2020-02-19 DIAGNOSIS — D689 Coagulation defect, unspecified: Secondary | ICD-10-CM | POA: Diagnosis not present

## 2020-02-19 DIAGNOSIS — R296 Repeated falls: Secondary | ICD-10-CM | POA: Diagnosis not present

## 2020-02-19 DIAGNOSIS — I480 Paroxysmal atrial fibrillation: Secondary | ICD-10-CM | POA: Diagnosis not present

## 2020-02-19 DIAGNOSIS — M5137 Other intervertebral disc degeneration, lumbosacral region: Secondary | ICD-10-CM | POA: Diagnosis not present

## 2020-02-19 DIAGNOSIS — K5790 Diverticulosis of intestine, part unspecified, without perforation or abscess without bleeding: Secondary | ICD-10-CM | POA: Diagnosis not present

## 2020-02-19 DIAGNOSIS — K222 Esophageal obstruction: Secondary | ICD-10-CM | POA: Diagnosis not present

## 2020-02-19 DIAGNOSIS — I10 Essential (primary) hypertension: Secondary | ICD-10-CM | POA: Diagnosis not present

## 2020-02-19 DIAGNOSIS — J42 Unspecified chronic bronchitis: Secondary | ICD-10-CM | POA: Diagnosis not present

## 2020-02-19 DIAGNOSIS — M1990 Unspecified osteoarthritis, unspecified site: Secondary | ICD-10-CM | POA: Diagnosis not present

## 2020-02-19 DIAGNOSIS — N289 Disorder of kidney and ureter, unspecified: Secondary | ICD-10-CM | POA: Diagnosis not present

## 2020-02-19 DIAGNOSIS — M47812 Spondylosis without myelopathy or radiculopathy, cervical region: Secondary | ICD-10-CM | POA: Diagnosis not present

## 2020-02-19 DIAGNOSIS — I700 Atherosclerosis of aorta: Secondary | ICD-10-CM | POA: Diagnosis not present

## 2020-02-19 DIAGNOSIS — R634 Abnormal weight loss: Secondary | ICD-10-CM | POA: Diagnosis not present

## 2020-02-19 DIAGNOSIS — S91311D Laceration without foreign body, right foot, subsequent encounter: Secondary | ICD-10-CM | POA: Diagnosis not present

## 2020-02-19 DIAGNOSIS — S50312D Abrasion of left elbow, subsequent encounter: Secondary | ICD-10-CM | POA: Diagnosis not present

## 2020-02-19 DIAGNOSIS — M47816 Spondylosis without myelopathy or radiculopathy, lumbar region: Secondary | ICD-10-CM | POA: Diagnosis not present

## 2020-02-19 DIAGNOSIS — R918 Other nonspecific abnormal finding of lung field: Secondary | ICD-10-CM | POA: Diagnosis not present

## 2020-02-19 DIAGNOSIS — J4520 Mild intermittent asthma, uncomplicated: Secondary | ICD-10-CM | POA: Diagnosis not present

## 2020-02-19 DIAGNOSIS — G4700 Insomnia, unspecified: Secondary | ICD-10-CM | POA: Diagnosis not present

## 2020-02-19 DIAGNOSIS — F17211 Nicotine dependence, cigarettes, in remission: Secondary | ICD-10-CM | POA: Diagnosis not present

## 2020-02-19 DIAGNOSIS — E034 Atrophy of thyroid (acquired): Secondary | ICD-10-CM | POA: Diagnosis not present

## 2020-02-19 DIAGNOSIS — Z9181 History of falling: Secondary | ICD-10-CM | POA: Diagnosis not present

## 2020-02-19 DIAGNOSIS — K2100 Gastro-esophageal reflux disease with esophagitis, without bleeding: Secondary | ICD-10-CM | POA: Diagnosis not present

## 2020-02-19 DIAGNOSIS — S60512D Abrasion of left hand, subsequent encounter: Secondary | ICD-10-CM | POA: Diagnosis not present

## 2020-02-21 DIAGNOSIS — S60512D Abrasion of left hand, subsequent encounter: Secondary | ICD-10-CM | POA: Diagnosis not present

## 2020-02-21 DIAGNOSIS — J4520 Mild intermittent asthma, uncomplicated: Secondary | ICD-10-CM | POA: Diagnosis not present

## 2020-02-21 DIAGNOSIS — I480 Paroxysmal atrial fibrillation: Secondary | ICD-10-CM | POA: Diagnosis not present

## 2020-02-21 DIAGNOSIS — S91311D Laceration without foreign body, right foot, subsequent encounter: Secondary | ICD-10-CM | POA: Diagnosis not present

## 2020-02-21 DIAGNOSIS — I10 Essential (primary) hypertension: Secondary | ICD-10-CM | POA: Diagnosis not present

## 2020-02-21 DIAGNOSIS — S50312D Abrasion of left elbow, subsequent encounter: Secondary | ICD-10-CM | POA: Diagnosis not present

## 2020-02-22 DIAGNOSIS — I480 Paroxysmal atrial fibrillation: Secondary | ICD-10-CM | POA: Diagnosis not present

## 2020-02-22 DIAGNOSIS — J4520 Mild intermittent asthma, uncomplicated: Secondary | ICD-10-CM | POA: Diagnosis not present

## 2020-02-22 DIAGNOSIS — S91311D Laceration without foreign body, right foot, subsequent encounter: Secondary | ICD-10-CM | POA: Diagnosis not present

## 2020-02-22 DIAGNOSIS — S50312D Abrasion of left elbow, subsequent encounter: Secondary | ICD-10-CM | POA: Diagnosis not present

## 2020-02-22 DIAGNOSIS — S60512D Abrasion of left hand, subsequent encounter: Secondary | ICD-10-CM | POA: Diagnosis not present

## 2020-02-22 DIAGNOSIS — I10 Essential (primary) hypertension: Secondary | ICD-10-CM | POA: Diagnosis not present

## 2020-02-23 ENCOUNTER — Encounter: Payer: Self-pay | Admitting: Internal Medicine

## 2020-02-23 ENCOUNTER — Other Ambulatory Visit: Payer: Self-pay

## 2020-02-23 ENCOUNTER — Ambulatory Visit (INDEPENDENT_AMBULATORY_CARE_PROVIDER_SITE_OTHER): Payer: Medicare Other | Admitting: Internal Medicine

## 2020-02-23 VITALS — BP 153/85 | HR 86 | Temp 97.9°F | Ht 63.0 in | Wt 155.0 lb

## 2020-02-23 DIAGNOSIS — E876 Hypokalemia: Secondary | ICD-10-CM | POA: Diagnosis not present

## 2020-02-23 DIAGNOSIS — I700 Atherosclerosis of aorta: Secondary | ICD-10-CM | POA: Diagnosis not present

## 2020-02-23 DIAGNOSIS — R600 Localized edema: Secondary | ICD-10-CM

## 2020-02-23 DIAGNOSIS — I10 Essential (primary) hypertension: Secondary | ICD-10-CM

## 2020-02-23 DIAGNOSIS — S91301A Unspecified open wound, right foot, initial encounter: Secondary | ICD-10-CM

## 2020-02-23 DIAGNOSIS — E034 Atrophy of thyroid (acquired): Secondary | ICD-10-CM

## 2020-02-23 MED ORDER — SPIRONOLACTONE 25 MG PO TABS: 25.0000 mg | ORAL_TABLET | Freq: Every day | ORAL | 0 refills | Status: DC

## 2020-02-23 NOTE — Progress Notes (Signed)
Date:  02/23/2020   Name:  Crystal Zavala   DOB:  May 05, 1932   MRN:  [id number]   Chief Complaint: Hospitalization Follow-up Follow up.  Hospitalized again 6/29 - 02/04/20.  Hospital Course by Problem: # Dysphagia due to esophageal stricture # Hypokalemia (resolved) # Hypomagnesemia (resolved) # Hyponatremia (resolved) Pt with worsening dysphagia consistent with prior episodes of esophageal stricture. Underwent EGD with dilation. Biopsies taken but no macroscopic evidence of malignancy. Symptoms improved after dilation and she was tolerating a regular diet.  Hydrochlorothiazide discontinued given difficulties with potassium and sodium balance.     >>She has completed the potassium Rx.   She continues to take magnesium.  She is now eating regular diet without dysphagia.  No vomiting.  # Concern for C. Diff colitis vs # Diverticulitis vs. Laxative use (resolved) Patient initially had some diarrhea and had recent been on a course of antibiotics for SSTI of foot, initial concern for C diff and started on po vanc but PCR returned negative and diarrhea resolved so po vanc was stopped. CT scan showed possible mild diverticulitis but she was not clinically infected so antibiotics were deferred and GI symptoms resolved.      >> No diarrhea has recurred.  She is a bit on the constipation side at time and is using Miralax PRN.  # R foot wound Right foot wound present without significant drainage, but erythema is present but not warm to touch. Just completed 10-day course of Keflex.  -Wound care consulted recommending putting Anasept gel and Mepilex foam daily to the foot.  - Aquaphor prescribed twice daily to the arms and elbows for Xerosis of arms  - did not start antibiotics but low threshold to do so as outpatient if developing infection suspected.       >>Home health is treating and dressing the foot wound weekly and it is improving.  She is no longer on antibiotics.  # Functional Decline -  PT/OT consulted and recommending home with continuation of [facility] aide.      >>she is still getting home PTx.  # Afib - Continue taking apixaban on 7/3 (held for 72 hours surrounding EGD)        >>she has good rate control on Diltiazem and takes Eliquis for anticoagulation.   Chronic conditions  #Hypothyroidism - continued home synthroid #Insomnia - continued home nortriptyline, melatonin #Asthma -continued home ICS/LABA #OA - Continued home Norco once daily #HTN -Discontinued hydrochlorothiazide. Continued diltiazem   Surgeries and Procedures Performed: Procedure(s): ESOPHAGOGASTRODUODENOSCOPY, FLEXIBLE, TRANSORAL; WITH BIOPSY, SINGLE OR MULTIPLE ESOPHAGOGASTRODUODENOSCOPY, FLEXIBLE, TRANSORAL; WITH TRANSENDOSCOPIC BALLOON DILATION OF ESOPHAGUS (LESS THAN 30 MM DIAMETER)  LE edema - this has significantly worsened since stopping HCTZ - stopped due to low potassium and magnesium.  She has completed the K+ supplement.  The edema will improve slightly overnight but returns quickly upon rising.  HTN - she won't allow BP cuff to her arms due to easy bruising and tenderness.  Her last reading by [facility] was noted below.  Lab Results  Component Value Date   CREATININE 0.94 01/18/2020   BUN 16 01/18/2020   NA 135 01/18/2020   K 4.0 01/18/2020   CL 94 (L) 01/18/2020   CO2 27 01/18/2020   Lab Results  Component Value Date   CHOL 240 (H) 08/26/2019   HDL 78 08/26/2019   LDLCALC 147 (H) 08/26/2019   TRIG 85 08/26/2019   CHOLHDL 3.1 08/26/2019   Lab Results  Component Value Date  TSH 17.600 (H) 08/26/2019   Lab Results  Component Value Date   HGBA1C 5.2 04/22/2017   Lab Results  Component Value Date   WBC 10.8 08/26/2019   HGB 14.0 08/26/2019   HCT 42.2 08/26/2019   MCV 95 08/26/2019   PLT 265 08/26/2019   Lab Results  Component Value Date   ALT 13 08/26/2019   AST 14 08/26/2019   ALKPHOS 104 08/26/2019   BILITOT 0.5 08/26/2019     Review of Systems  Constitutional:  Negative for appetite change, chills, fatigue and fever.  Respiratory: Positive for shortness of breath (with exertion). Negative for chest tightness.   Cardiovascular: Positive for leg swelling. Negative for chest pain.  Gastrointestinal: Positive for constipation (treated with miralax). Negative for abdominal pain and diarrhea.  Skin: Positive for wound (being treated by [facility] RN).  Neurological: Positive for light-headedness. Negative for dizziness and headaches.    Patient Active Problem List   Diagnosis Date Noted  . Unspecified inflammatory spondylopathy, cervical region ([place]) 02/23/2020  . Decreased functional activity tolerance 02/01/2020  . Acquired thrombophilia ([place]) 01/18/2020  . Paroxysmal atrial fibrillation ([place]) 02/23/2019  . Allergy to tramadol 09/03/2018  . Pain of both hip joints 04/22/2017  . Renal insufficiency 01/05/2017  . 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 Spondylosis of lumbar region without myelopathy or radiculopathy 10/21/2016  . Hypothyroidism due to acquired atrophy of thyroid 04/22/2016  . Insomnia 04/22/2016  . DDD (degenerative disc disease), lumbar 04/22/2016  . Breast mass, right 01/10/2016  . Aortic atherosclerosis ([place]) 01/18/2015  . Arthritis of neck 01/18/2015  . Dermatitis, eczematoid 01/18/2015  . Essential (primary) hypertension 01/18/2015  . Esophagitis, reflux 01/18/2015  . H/O adenomatous polyp of colon 01/18/2015  . Migraine without aura and responsive to treatment 01/18/2015  . Asthma, mild intermittent 01/18/2015  . Hyperlipidemia, mild 01/18/2015  . Lung nodule, multiple 01/18/2015  . Kidney cysts 01/18/2015  . Tobacco use disorder, moderate, in sustained remission 01/18/2015  . Temporary cerebral vascular dysfunction 01/18/2015  . Dysphagia 10/26/2013    Allergies  Allergen Reactions  . Amlodipine Swelling  . Tramadol Nausea Only and Rash  . Ace Inhibitors Cough    Past Surgical History:    Procedure Laterality Date  . ABDOMINAL HYSTERECTOMY    . BLEPHAROPLASTY Bilateral 2013  . BREAST BIOPSY Left 2009   benign  . COLONOSCOPY  2006   Normal  . ESI  01/2017   L5-S1 @ [facility]  . ESOPHAGOGASTRODUODENOSCOPY  2015   Dilation of esophageal stricture  . TOTAL HIP ARTHROPLASTY Right 2019    Social History   Tobacco Use  . Smoking status: Former Smoker    Packs/day: 0.50    Years: 39.00    Pack years: 19.50    Types: Cigarettes    Quit date: 1992    Years since quitting: 29.5  . Smokeless tobacco: Never Used  . Tobacco comment: smoking cessation materails not required  Vaping Use  . Vaping Use: Never used  Substance Use Topics  . Alcohol use: No    Alcohol/week: 0.0 standard drinks  . Drug use: No     Medication list has been reviewed and updated.  Current Meds  Medication Sig  . albuterol (VENTOLIN HFA) 108 (90 Base) MCG/ACT inhaler Inhale into the lungs every 6 (six) hours as needed for wheezing or shortness of breath.  . cetirizine (ZYRTEC) 10 MG tablet Take 10 mg by mouth daily.  Marland Kitchen diltiazem (TIAZAC) 120 MG  24 hr capsule Take 120 mg by mouth daily.  Marland Kitchen ELIQUIS 5 MG TABS tablet Take 5 mg by mouth 2 (two) times daily.  Marland Kitchen Emollient [facility]) OINT Apply topically in the morning and at bedtime.   . Fluticasone-Salmeterol (ADVAIR DISKUS) 250-50 MCG/DOSE AEPB Inhale 1 puff into the lungs 2 (two) times daily.  Marland Kitchen HYDROcodone-acetaminophen (NORCO/VICODIN) 5-325 MG tablet Take 1 tablet by mouth every 6 (six) hours as needed for moderate pain.  Marland Kitchen levothyroxine (SYNTHROID) 88 MCG tablet Take 88 mcg by mouth daily before breakfast. Monday through Saturday  And take 1/2 tablet on Sundays.  . magnesium chloride (SLOW-MAG) 64 MG TBEC SR tablet Take by mouth.  . nortriptyline (PAMELOR) 10 MG capsule Take 10 mg by mouth at bedtime. 1 cap by mouth 1 hour before bedtime Then 2 caps by mouth 1 hour before bedtime.  . ondansetron (ZOFRAN) 4 MG tablet Take 4 mg by mouth 2 (two)  times daily.  . pantoprazole (PROTONIX) 40 MG tablet TAKE 1 TABLET DAILY  . Vitamin D, Cholecalciferol, 50 MCG (2000 UT) CAPS Take 2 capsules by mouth daily.    PHQ 2/9 Scores 02/23/2020 01/18/2020 12/01/2019 08/26/2019  PHQ - 2 Score 0 0 0 0  PHQ- 9 Score 1 6 - -    GAD 7 : Generalized Anxiety Score 02/23/2020 01/18/2020  Nervous, Anxious, on Edge 0 1  Control/stop worrying 0 3  Worry too much - different things 0 3  Trouble relaxing 0 0  Restless 0 0  Easily annoyed or irritable 0 1  Afraid - awful might happen 0 0  Total GAD 7 Score 0 8  Anxiety Difficulty Not difficult at all Not difficult at all    BP Readings from Last 3 Encounters:  01/18/20 126/64  08/26/19 116/74  03/31/19 118/82    Physical Exam Constitutional:      Appearance: Normal appearance.  Cardiovascular:     Rate and Rhythm: Normal rate. Rhythm irregular.     Pulses: Normal pulses.     Heart sounds: No murmur heard.   Pulmonary:     Effort: Pulmonary effort is normal.     Breath sounds: No wheezing or rhonchi.  Musculoskeletal:     Cervical back: Normal range of motion.     Right lower leg: Edema (pitting edema both lower legs and feet) present.     Left lower leg: Edema present.  Lymphadenopathy:     Cervical: No cervical adenopathy.  Skin:    General: Skin is warm.     Findings: Bruising (on arms and legs) present.  Neurological:     General: No focal deficit present.     Mental Status: She is alert and oriented to person, place, and time.     Wt Readings from Last 3 Encounters:  02/23/20 155 lb (70.3 kg)  01/18/20 156 lb (70.8 kg)  08/26/19 163 lb (73.9 kg)    Pulse 86   Temp 97.9 F (36.6 C) (Oral)   Ht 5\' 3"  (1.6 m)   Wt 155 lb (70.3 kg)   SpO2 97%   BMI 27.46 kg/m   Assessment and Plan: 1. Bilateral leg edema Will start spironolactone Continue elevation Check electrolytes now and on follow up in one month  2. Wound of right foot Dressing intact - not removed  3.  Essential (primary) hypertension BP only fair control per [facility] May improve with addition of spironolactone Return in one month and bring home wrist cuff to the visit - spironolactone (  ALDACTONE) 25 MG tablet; Take 1 tablet (25 mg total) by mouth daily.  Dispense: 30 tablet; Refill: 0 - Basic metabolic panel  4. Aortic atherosclerosis (HCC) Not currently on statin therapy secondary to age She is on Eliquis  5. Hypokalemia Completed out patient supplementation - Basic metabolic panel  6. Hypomagnesemia Continue oral magnesium and check levels - Magnesium  7. Hypothyroidism due to acquired atrophy of thyroid Medication refilled by Endocrinology - TSH+T4F+T3Free   Partially dictated using Dragon software. Any errors are unintentional.  Halina Maidens, MD [facility]  02/23/2020

## 2020-02-24 LAB — BASIC METABOLIC PANEL
BUN/Creatinine Ratio: 12 (ref 12–28)
BUN: 11 mg/dL (ref 8–27)
CO2: 24 mmol/L (ref 20–29)
Calcium: 10.5 mg/dL — ABNORMAL HIGH (ref 8.7–10.3)
Chloride: 103 mmol/L (ref 96–106)
Creatinine, Ser: 0.91 mg/dL (ref 0.57–1.00)
GFR calc Af Amer: 66 mL/min/{1.73_m2} (ref >59)
GFR calc non Af Amer: 57 mL/min/{1.73_m2} — ABNORMAL LOW (ref >59)
Glucose: 113 mg/dL — ABNORMAL HIGH (ref 65–99)
Potassium: 4.1 mmol/L (ref 3.5–5.2)
Sodium: 140 mmol/L (ref 134–144)

## 2020-02-24 LAB — TSH+T4F+T3FREE
Free T4: 1.93 ng/dL — ABNORMAL HIGH (ref 0.82–1.77)
T3, Free: 2.7 pg/mL (ref 2.0–4.4)
TSH: 1.8 u[IU]/mL (ref 0.450–4.500)

## 2020-02-24 LAB — MAGNESIUM: Magnesium: 2.1 mg/dL (ref 1.6–2.3)

## 2020-02-29 DIAGNOSIS — S60512D Abrasion of left hand, subsequent encounter: Secondary | ICD-10-CM | POA: Diagnosis not present

## 2020-02-29 DIAGNOSIS — S91311D Laceration without foreign body, right foot, subsequent encounter: Secondary | ICD-10-CM | POA: Diagnosis not present

## 2020-02-29 DIAGNOSIS — I10 Essential (primary) hypertension: Secondary | ICD-10-CM | POA: Diagnosis not present

## 2020-02-29 DIAGNOSIS — J4520 Mild intermittent asthma, uncomplicated: Secondary | ICD-10-CM | POA: Diagnosis not present

## 2020-02-29 DIAGNOSIS — I480 Paroxysmal atrial fibrillation: Secondary | ICD-10-CM | POA: Diagnosis not present

## 2020-02-29 DIAGNOSIS — S50312D Abrasion of left elbow, subsequent encounter: Secondary | ICD-10-CM | POA: Diagnosis not present

## 2020-03-02 DIAGNOSIS — S60512D Abrasion of left hand, subsequent encounter: Secondary | ICD-10-CM | POA: Diagnosis not present

## 2020-03-02 DIAGNOSIS — S91311D Laceration without foreign body, right foot, subsequent encounter: Secondary | ICD-10-CM | POA: Diagnosis not present

## 2020-03-02 DIAGNOSIS — I10 Essential (primary) hypertension: Secondary | ICD-10-CM | POA: Diagnosis not present

## 2020-03-02 DIAGNOSIS — S50312D Abrasion of left elbow, subsequent encounter: Secondary | ICD-10-CM | POA: Diagnosis not present

## 2020-03-02 DIAGNOSIS — I480 Paroxysmal atrial fibrillation: Secondary | ICD-10-CM | POA: Diagnosis not present

## 2020-03-02 DIAGNOSIS — J4520 Mild intermittent asthma, uncomplicated: Secondary | ICD-10-CM | POA: Diagnosis not present

## 2020-03-06 DIAGNOSIS — I480 Paroxysmal atrial fibrillation: Secondary | ICD-10-CM | POA: Diagnosis not present

## 2020-03-06 DIAGNOSIS — I10 Essential (primary) hypertension: Secondary | ICD-10-CM | POA: Diagnosis not present

## 2020-03-06 DIAGNOSIS — S60512D Abrasion of left hand, subsequent encounter: Secondary | ICD-10-CM | POA: Diagnosis not present

## 2020-03-06 DIAGNOSIS — J4520 Mild intermittent asthma, uncomplicated: Secondary | ICD-10-CM | POA: Diagnosis not present

## 2020-03-06 DIAGNOSIS — S50312D Abrasion of left elbow, subsequent encounter: Secondary | ICD-10-CM | POA: Diagnosis not present

## 2020-03-06 DIAGNOSIS — S91311D Laceration without foreign body, right foot, subsequent encounter: Secondary | ICD-10-CM | POA: Diagnosis not present

## 2020-03-07 DIAGNOSIS — J4520 Mild intermittent asthma, uncomplicated: Secondary | ICD-10-CM | POA: Diagnosis not present

## 2020-03-07 DIAGNOSIS — S50312D Abrasion of left elbow, subsequent encounter: Secondary | ICD-10-CM | POA: Diagnosis not present

## 2020-03-07 DIAGNOSIS — S60512D Abrasion of left hand, subsequent encounter: Secondary | ICD-10-CM | POA: Diagnosis not present

## 2020-03-07 DIAGNOSIS — I10 Essential (primary) hypertension: Secondary | ICD-10-CM | POA: Diagnosis not present

## 2020-03-07 DIAGNOSIS — S91311D Laceration without foreign body, right foot, subsequent encounter: Secondary | ICD-10-CM | POA: Diagnosis not present

## 2020-03-07 DIAGNOSIS — I480 Paroxysmal atrial fibrillation: Secondary | ICD-10-CM | POA: Diagnosis not present

## 2020-03-09 DIAGNOSIS — J4520 Mild intermittent asthma, uncomplicated: Secondary | ICD-10-CM | POA: Diagnosis not present

## 2020-03-09 DIAGNOSIS — I480 Paroxysmal atrial fibrillation: Secondary | ICD-10-CM | POA: Diagnosis not present

## 2020-03-09 DIAGNOSIS — S91311D Laceration without foreign body, right foot, subsequent encounter: Secondary | ICD-10-CM | POA: Diagnosis not present

## 2020-03-09 DIAGNOSIS — S60512D Abrasion of left hand, subsequent encounter: Secondary | ICD-10-CM | POA: Diagnosis not present

## 2020-03-09 DIAGNOSIS — S50312D Abrasion of left elbow, subsequent encounter: Secondary | ICD-10-CM | POA: Diagnosis not present

## 2020-03-09 DIAGNOSIS — I10 Essential (primary) hypertension: Secondary | ICD-10-CM | POA: Diagnosis not present

## 2020-03-14 ENCOUNTER — Telehealth: Payer: Self-pay | Admitting: Internal Medicine

## 2020-03-14 NOTE — Telephone Encounter (Signed)
Patient inquired if Dr. Army Melia would prescribe her a medication to help her sleep. Patient states she has taken valium before and inquired if that were a possibility. Patient declined appointment.

## 2020-03-15 DIAGNOSIS — I10 Essential (primary) hypertension: Secondary | ICD-10-CM | POA: Diagnosis not present

## 2020-03-15 DIAGNOSIS — S60512D Abrasion of left hand, subsequent encounter: Secondary | ICD-10-CM | POA: Diagnosis not present

## 2020-03-15 DIAGNOSIS — I480 Paroxysmal atrial fibrillation: Secondary | ICD-10-CM | POA: Diagnosis not present

## 2020-03-15 DIAGNOSIS — S50312D Abrasion of left elbow, subsequent encounter: Secondary | ICD-10-CM | POA: Diagnosis not present

## 2020-03-15 DIAGNOSIS — J4520 Mild intermittent asthma, uncomplicated: Secondary | ICD-10-CM | POA: Diagnosis not present

## 2020-03-15 DIAGNOSIS — S91311D Laceration without foreign body, right foot, subsequent encounter: Secondary | ICD-10-CM | POA: Diagnosis not present

## 2020-03-17 ENCOUNTER — Telehealth: Payer: Self-pay | Admitting: Internal Medicine

## 2020-03-17 NOTE — Telephone Encounter (Signed)
Cliff OT with [facility] called in to make provider aware that pt declined OT services/ evaluation.   CB: [phone]

## 2020-03-17 NOTE — Telephone Encounter (Signed)
Noted  KP 

## 2020-03-18 DIAGNOSIS — S91311D Laceration without foreign body, right foot, subsequent encounter: Secondary | ICD-10-CM | POA: Diagnosis not present

## 2020-03-18 DIAGNOSIS — S50312D Abrasion of left elbow, subsequent encounter: Secondary | ICD-10-CM | POA: Diagnosis not present

## 2020-03-18 DIAGNOSIS — I10 Essential (primary) hypertension: Secondary | ICD-10-CM | POA: Diagnosis not present

## 2020-03-18 DIAGNOSIS — J4520 Mild intermittent asthma, uncomplicated: Secondary | ICD-10-CM | POA: Diagnosis not present

## 2020-03-18 DIAGNOSIS — S60512D Abrasion of left hand, subsequent encounter: Secondary | ICD-10-CM | POA: Diagnosis not present

## 2020-03-18 DIAGNOSIS — I480 Paroxysmal atrial fibrillation: Secondary | ICD-10-CM | POA: Diagnosis not present

## 2020-03-20 DIAGNOSIS — M47816 Spondylosis without myelopathy or radiculopathy, lumbar region: Secondary | ICD-10-CM | POA: Diagnosis not present

## 2020-03-20 DIAGNOSIS — E034 Atrophy of thyroid (acquired): Secondary | ICD-10-CM | POA: Diagnosis not present

## 2020-03-20 DIAGNOSIS — R634 Abnormal weight loss: Secondary | ICD-10-CM | POA: Diagnosis not present

## 2020-03-20 DIAGNOSIS — G43909 Migraine, unspecified, not intractable, without status migrainosus: Secondary | ICD-10-CM | POA: Diagnosis not present

## 2020-03-20 DIAGNOSIS — J4520 Mild intermittent asthma, uncomplicated: Secondary | ICD-10-CM | POA: Diagnosis not present

## 2020-03-20 DIAGNOSIS — Z9181 History of falling: Secondary | ICD-10-CM | POA: Diagnosis not present

## 2020-03-20 DIAGNOSIS — J42 Unspecified chronic bronchitis: Secondary | ICD-10-CM | POA: Diagnosis not present

## 2020-03-20 DIAGNOSIS — M5137 Other intervertebral disc degeneration, lumbosacral region: Secondary | ICD-10-CM | POA: Diagnosis not present

## 2020-03-20 DIAGNOSIS — I480 Paroxysmal atrial fibrillation: Secondary | ICD-10-CM | POA: Diagnosis not present

## 2020-03-20 DIAGNOSIS — I700 Atherosclerosis of aorta: Secondary | ICD-10-CM | POA: Diagnosis not present

## 2020-03-20 DIAGNOSIS — R918 Other nonspecific abnormal finding of lung field: Secondary | ICD-10-CM | POA: Diagnosis not present

## 2020-03-20 DIAGNOSIS — N289 Disorder of kidney and ureter, unspecified: Secondary | ICD-10-CM | POA: Diagnosis not present

## 2020-03-20 DIAGNOSIS — G4700 Insomnia, unspecified: Secondary | ICD-10-CM | POA: Diagnosis not present

## 2020-03-20 DIAGNOSIS — K2100 Gastro-esophageal reflux disease with esophagitis, without bleeding: Secondary | ICD-10-CM | POA: Diagnosis not present

## 2020-03-20 DIAGNOSIS — I10 Essential (primary) hypertension: Secondary | ICD-10-CM | POA: Diagnosis not present

## 2020-03-20 DIAGNOSIS — M47812 Spondylosis without myelopathy or radiculopathy, cervical region: Secondary | ICD-10-CM | POA: Diagnosis not present

## 2020-03-20 DIAGNOSIS — M1990 Unspecified osteoarthritis, unspecified site: Secondary | ICD-10-CM | POA: Diagnosis not present

## 2020-03-20 DIAGNOSIS — F17211 Nicotine dependence, cigarettes, in remission: Secondary | ICD-10-CM | POA: Diagnosis not present

## 2020-03-20 DIAGNOSIS — K5790 Diverticulosis of intestine, part unspecified, without perforation or abscess without bleeding: Secondary | ICD-10-CM | POA: Diagnosis not present

## 2020-03-20 DIAGNOSIS — D689 Coagulation defect, unspecified: Secondary | ICD-10-CM | POA: Diagnosis not present

## 2020-03-20 DIAGNOSIS — K222 Esophageal obstruction: Secondary | ICD-10-CM | POA: Diagnosis not present

## 2020-03-24 ENCOUNTER — Other Ambulatory Visit: Payer: Self-pay | Admitting: Internal Medicine

## 2020-03-24 DIAGNOSIS — I10 Essential (primary) hypertension: Secondary | ICD-10-CM

## 2020-03-24 DIAGNOSIS — M47816 Spondylosis without myelopathy or radiculopathy, lumbar region: Secondary | ICD-10-CM

## 2020-03-24 DIAGNOSIS — N289 Disorder of kidney and ureter, unspecified: Secondary | ICD-10-CM

## 2020-03-24 DIAGNOSIS — G43009 Migraine without aura, not intractable, without status migrainosus: Secondary | ICD-10-CM

## 2020-03-24 DIAGNOSIS — I480 Paroxysmal atrial fibrillation: Secondary | ICD-10-CM

## 2020-03-24 DIAGNOSIS — D6869 Other thrombophilia: Secondary | ICD-10-CM

## 2020-03-24 DIAGNOSIS — R6889 Other general symptoms and signs: Secondary | ICD-10-CM

## 2020-03-24 DIAGNOSIS — F5101 Primary insomnia: Secondary | ICD-10-CM

## 2020-03-24 DIAGNOSIS — I700 Atherosclerosis of aorta: Secondary | ICD-10-CM

## 2020-03-24 DIAGNOSIS — K2100 Gastro-esophageal reflux disease with esophagitis, without bleeding: Secondary | ICD-10-CM

## 2020-03-24 DIAGNOSIS — E034 Atrophy of thyroid (acquired): Secondary | ICD-10-CM

## 2020-03-24 NOTE — Progress Notes (Signed)
Received orders from [facility]. Start of care 01/20/20.   Orders from 03/20/20 through 05/18/20 are reviewed, signed and faxed.

## 2020-03-27 ENCOUNTER — Telehealth: Payer: Self-pay | Admitting: Internal Medicine

## 2020-03-27 NOTE — Telephone Encounter (Signed)
Called to inform doctor that patient has refused home health visits.  Would like a verbal D/C order.  Please advise and call at [phone]

## 2020-03-27 NOTE — Telephone Encounter (Signed)
Called and gave verbal to Leigh to D/C orders.  CM

## 2020-03-28 DIAGNOSIS — I10 Essential (primary) hypertension: Secondary | ICD-10-CM | POA: Diagnosis not present

## 2020-03-28 DIAGNOSIS — J4520 Mild intermittent asthma, uncomplicated: Secondary | ICD-10-CM | POA: Diagnosis not present

## 2020-03-28 DIAGNOSIS — J42 Unspecified chronic bronchitis: Secondary | ICD-10-CM | POA: Diagnosis not present

## 2020-03-28 DIAGNOSIS — K222 Esophageal obstruction: Secondary | ICD-10-CM | POA: Diagnosis not present

## 2020-03-28 DIAGNOSIS — I480 Paroxysmal atrial fibrillation: Secondary | ICD-10-CM | POA: Diagnosis not present

## 2020-03-28 DIAGNOSIS — I700 Atherosclerosis of aorta: Secondary | ICD-10-CM | POA: Diagnosis not present

## 2020-04-03 ENCOUNTER — Other Ambulatory Visit: Payer: Self-pay

## 2020-04-03 ENCOUNTER — Encounter: Payer: Self-pay | Admitting: Internal Medicine

## 2020-04-03 ENCOUNTER — Ambulatory Visit (INDEPENDENT_AMBULATORY_CARE_PROVIDER_SITE_OTHER): Payer: Medicare Other | Admitting: Internal Medicine

## 2020-04-03 VITALS — BP 132/80 | HR 98 | Temp 98.1°F | Ht 63.0 in | Wt 151.0 lb

## 2020-04-03 DIAGNOSIS — I10 Essential (primary) hypertension: Secondary | ICD-10-CM

## 2020-04-03 DIAGNOSIS — F5101 Primary insomnia: Secondary | ICD-10-CM

## 2020-04-03 DIAGNOSIS — L853 Xerosis cutis: Secondary | ICD-10-CM | POA: Diagnosis not present

## 2020-04-03 DIAGNOSIS — M47816 Spondylosis without myelopathy or radiculopathy, lumbar region: Secondary | ICD-10-CM | POA: Diagnosis not present

## 2020-04-03 MED ORDER — BELSOMRA 10 MG PO TABS: 1.0000 | ORAL_TABLET | Freq: Every day | ORAL | 0 refills | Status: DC

## 2020-04-03 MED ORDER — SPIRONOLACTONE 25 MG PO TABS: 25.0000 mg | ORAL_TABLET | Freq: Every day | ORAL | 5 refills | Status: DC

## 2020-04-03 NOTE — Progress Notes (Signed)
Date:  04/03/2020   Name:  Crystal Zavala   DOB:  1932/01/08   MRN:  [id number]   Chief Complaint: Hypertension (168/82 checked at home/ will not let me check it ), Edema (started back x3 dyas ago, both feet, raising feet up helps a little), Insomnia, driving (when can she start back driving?), and epideral (wants your opinion about getting it)  Hypertension This is a chronic problem. Pertinent negatives include no chest pain, headaches, palpitations or shortness of breath. Past treatments include diuretics and calcium channel blockers (diltiazem; added spironolactone last month). The current treatment provides significant (she did well until she ran out of meds a week ago) improvement. There are no compliance problems.   Insomnia Primary symptoms: sleep disturbance, frequent awakening, premature morning awakening.  The problem occurs nightly. Treatments tried: nortriptyline. The treatment provided no relief. Typical bedtime:  8-10 P.M..   Back Pain This is a chronic problem. The problem occurs daily. The problem has been gradually worsening since onset. The pain is present in the lumbar spine. Pertinent negatives include no abdominal pain, chest pain, dysuria, fever, headaches or numbness.  Wound - sustained in June to top of her right foot.  Initially treated with keflex then wound care by [facility].  However, pt has dismissed home health at this time.  Wound is now healed.  Lab Results  Component Value Date   CREATININE 0.91 02/23/2020   BUN 11 02/23/2020   NA 140 02/23/2020   K 4.1 02/23/2020   CL 103 02/23/2020   CO2 24 02/23/2020   Lab Results  Component Value Date   CHOL 240 (H) 08/26/2019   HDL 78 08/26/2019   LDLCALC 147 (H) 08/26/2019   TRIG 85 08/26/2019   CHOLHDL 3.1 08/26/2019   Lab Results  Component Value Date   TSH 1.800 02/23/2020   Lab Results  Component Value Date   HGBA1C 5.2 04/22/2017   Lab Results  Component Value Date   WBC 10.8 08/26/2019   HGB 14.0  08/26/2019   HCT 42.2 08/26/2019   MCV 95 08/26/2019   PLT 265 08/26/2019   Lab Results  Component Value Date   ALT 13 08/26/2019   AST 14 08/26/2019   ALKPHOS 104 08/26/2019   BILITOT 0.5 08/26/2019     Review of Systems  Constitutional: Negative for appetite change, fatigue, fever and unexpected weight change.  HENT: Negative for trouble swallowing.   Eyes: Negative for visual disturbance.  Respiratory: Negative for cough, chest tightness and shortness of breath.   Cardiovascular: Negative for chest pain, palpitations and leg swelling.  Gastrointestinal: Negative for abdominal pain.  Genitourinary: Negative for dysuria and hematuria.  Musculoskeletal: Positive for back pain. Negative for arthralgias.  Skin: Positive for wound.  Neurological: Negative for tremors, numbness and headaches.  Psychiatric/Behavioral: Positive for sleep disturbance. Negative for dysphoric mood. The patient has insomnia. The patient is not nervous/anxious.     Patient Active Problem List   Diagnosis Date Noted  . Decreased functional activity tolerance 02/01/2020  . Acquired thrombophilia ([place]) 01/18/2020  . Paroxysmal atrial fibrillation ([place]) 02/23/2019  . Allergy to tramadol 09/03/2018  . Pain of both hip joints 04/22/2017  . Renal insufficiency 01/05/2017  . 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 Spondylosis of lumbar region without myelopathy or radiculopathy 10/21/2016  . Hypothyroidism due to acquired atrophy of thyroid 04/22/2016  . Insomnia 04/22/2016  . Breast mass, right 01/10/2016  . Aortic atherosclerosis ([place]) 01/18/2015  .  Arthritis of neck 01/18/2015  . Dermatitis, eczematoid 01/18/2015  . Essential (primary) hypertension 01/18/2015  . Esophagitis, reflux 01/18/2015  . H/O adenomatous polyp of colon 01/18/2015  . Migraine without aura and responsive to treatment 01/18/2015  . Asthma, mild intermittent 01/18/2015  . Hyperlipidemia, mild  01/18/2015  . Lung nodule, multiple 01/18/2015  . Kidney cysts 01/18/2015  . Tobacco use disorder, moderate, in sustained remission 01/18/2015  . Temporary cerebral vascular dysfunction 01/18/2015  . Dysphagia 10/26/2013    Allergies  Allergen Reactions  . Amlodipine Swelling  . Tramadol Nausea Only and Rash  . Ace Inhibitors Cough    Past Surgical History:  Procedure Laterality Date  . ABDOMINAL HYSTERECTOMY    . BLEPHAROPLASTY Bilateral 2013  . BREAST BIOPSY Left 2009   benign  . COLONOSCOPY  2006   Normal  . ESI  01/2017   L5-S1 @ [facility]  . ESOPHAGOGASTRODUODENOSCOPY  2015   Dilation of esophageal stricture  . TOTAL HIP ARTHROPLASTY Right 2019    Social History   Tobacco Use  . Smoking status: Former Smoker    Packs/day: 0.50    Years: 39.00    Pack years: 19.50    Types: Cigarettes    Quit date: 1992    Years since quitting: 29.6  . Smokeless tobacco: Never Used  . Tobacco comment: smoking cessation materails not required  Vaping Use  . Vaping Use: Never used  Substance Use Topics  . Alcohol use: No    Alcohol/week: 0.0 standard drinks  . Drug use: No     Medication list has been reviewed and updated.  Current Meds  Medication Sig  . albuterol (VENTOLIN HFA) 108 (90 Base) MCG/ACT inhaler Inhale into the lungs every 6 (six) hours as needed for wheezing or shortness of breath.  . cetirizine (ZYRTEC) 10 MG tablet Take 10 mg by mouth daily.  Marland Kitchen diltiazem (TIAZAC) 120 MG 24 hr capsule Take 120 mg by mouth daily.   Marland Kitchen ELIQUIS 5 MG TABS tablet Take 5 mg by mouth 2 (two) times daily.  . Fluticasone-Salmeterol (ADVAIR DISKUS) 250-50 MCG/DOSE AEPB Inhale 1 puff into the lungs 2 (two) times daily.  Marland Kitchen HYDROcodone-acetaminophen (NORCO/VICODIN) 5-325 MG tablet Take 1 tablet by mouth every 6 (six) hours as needed for moderate pain.  Marland Kitchen levothyroxine (SYNTHROID) 88 MCG tablet Take 88 mcg by mouth daily before breakfast. Monday through Saturday  And take 1/2 tablet on  Sundays.  . magnesium chloride (SLOW-MAG) 64 MG TBEC SR tablet Take by mouth.  . ondansetron (ZOFRAN) 4 MG tablet Take 4 mg by mouth 2 (two) times daily.  . pantoprazole (PROTONIX) 40 MG tablet TAKE 1 TABLET DAILY (Patient taking differently: No sig reported)  . Vitamin D, Cholecalciferol, 50 MCG (2000 UT) CAPS Take 2 capsules by mouth daily.  . [DISCONTINUED] nortriptyline (PAMELOR) 10 MG capsule Take 10 mg by mouth at bedtime. 1 cap by mouth 1 hour before bedtime Then 2 caps by mouth 1 hour before bedtime.    PHQ 2/9 Scores 02/23/2020 01/18/2020 12/01/2019 08/26/2019  PHQ - 2 Score 0 0 0 0  PHQ- 9 Score 1 6 - -    GAD 7 : Generalized Anxiety Score 02/23/2020 01/18/2020  Nervous, Anxious, on Edge 0 1  Control/stop worrying 0 3  Worry too much - different things 0 3  Trouble relaxing 0 0  Restless 0 0  Easily annoyed or irritable 0 1  Afraid - awful might happen 0 0  Total GAD 7 Score  0 8  Anxiety Difficulty Not difficult at all Not difficult at all    BP Readings from Last 3 Encounters:  04/03/20 132/80  02/23/20 (!) 153/85  01/18/20 126/64    Physical Exam Vitals and nursing note reviewed.  Constitutional:      General: She is not in acute distress.    Appearance: Normal appearance. She is well-developed.  HENT:     Head: Normocephalic and atraumatic.  Cardiovascular:     Rate and Rhythm: Normal rate. Rhythm irregular.     Heart sounds: No murmur heard.   Pulmonary:     Effort: Pulmonary effort is normal. No respiratory distress.     Breath sounds: No wheezing or rhonchi.  Musculoskeletal:        General: Normal range of motion.     Cervical back: Normal range of motion.     Right lower leg: Edema present.     Left lower leg: Edema present.  Lymphadenopathy:     Cervical: No cervical adenopathy.  Skin:    General: Skin is warm and dry.     Capillary Refill: Capillary refill takes less than 2 seconds.     Coloration: Skin is not ashen, jaundiced, mottled or sallow.       Findings: Bruising present. No rash.     Comments: Dorsum of right foot - healed  Neurological:     General: No focal deficit present.     Mental Status: She is alert and oriented to person, place, and time.  Psychiatric:        Mood and Affect: Mood normal.        Behavior: Behavior normal.        Thought Content: Thought content normal.     Wt Readings from Last 3 Encounters:  04/03/20 151 lb (68.5 kg)  02/23/20 155 lb (70.3 kg)  01/18/20 156 lb (70.8 kg)    BP 132/80   Pulse 98   Temp 98.1 F (36.7 C) (Oral)   Ht 5\' 3"  (1.6 m)   Wt 151 lb (68.5 kg)   SpO2 90%   BMI 26.75 kg/m   Assessment and Plan: 1. Essential (primary) hypertension BP is under improved control with Diltiazem and spironolactone Resume spironolactone Check BMP next visit - spironolactone (ALDACTONE) 25 MG tablet; Take 1 tablet (25 mg total) by mouth daily.  Dispense: 30 tablet; Refill: 5  2. Primary insomnia Stop Pamelor due to lack of benefit Start Belsomra 10 mg sample plus #10 w/coupon - Suvorexant (BELSOMRA) 10 MG TABS; Take 1 tablet by mouth at bedtime.  Dispense: 10 tablet; Refill: 0  3. Xerosis of skin Recommend Cerave or Eucerin Foot wound has healed  4. Spondylosis of lumbar region without myelopathy or radiculopathy Seeing Pain management at [facility] Contemplating an ESI She can proceed as long as she stops Eliquis 7 days prior   Partially dictated using Editor, commissioning. Any errors are unintentional.  Halina Maidens, MD [facility]  04/03/2020

## 2020-04-03 NOTE — Patient Instructions (Signed)
Cerave or Eucerin - moisturizing lotions

## 2020-04-05 ENCOUNTER — Other Ambulatory Visit: Payer: Self-pay | Admitting: Internal Medicine

## 2020-04-05 ENCOUNTER — Telehealth: Payer: Self-pay | Admitting: Internal Medicine

## 2020-04-05 DIAGNOSIS — F5101 Primary insomnia: Secondary | ICD-10-CM

## 2020-04-05 MED ORDER — TRAZODONE HCL 50 MG PO TABS: 25.0000 mg | ORAL_TABLET | Freq: Every evening | ORAL | 3 refills | Status: DC | PRN

## 2020-04-05 NOTE — Telephone Encounter (Signed)
I sent in an alternative medication.

## 2020-04-05 NOTE — Telephone Encounter (Signed)
Called pt she said that she has taken 2 of them that she will take the last one tonight. She said when she tried to use the coupon she could not use it because she has medicare and a supplement. Pt said its stated in small print on the coupon. She said that they pharmacy told her the price for 10 pills which was 128$ and $313 for a 30 day supply.   KP

## 2020-04-05 NOTE — Telephone Encounter (Signed)
Did she even try it?  I gave her 3 pills as a sample and a coupon to get 10 free. How did she even find out the cost?

## 2020-04-05 NOTE — Telephone Encounter (Signed)
traZODone (DESYREL) 50 MG tablet Medication Date: 04/05/2020 Department: [facility] Ordering/Authorizing: Glean Hess, MD   This was just sent to [place] Pt request that she says a lot of money on this at [facility], request a resend to  [place], [place] Phone:  [phone]  Fax:  [phone]

## 2020-04-05 NOTE — Telephone Encounter (Signed)
Please Advise. Pt take Belsomra 10 mg. Last RF 04/03/2020.  KP

## 2020-04-05 NOTE — Telephone Encounter (Signed)
Patient called to ask for an alternative to the medication she prescribed for sleep, which is too expensive.  Please advise and call patient to discuss at [phone]

## 2020-04-05 NOTE — Telephone Encounter (Signed)
Called pt to let her know that new medication was sent in. Pt verbalized understanding.  KP

## 2020-04-06 DIAGNOSIS — M169 Osteoarthritis of hip, unspecified: Secondary | ICD-10-CM | POA: Diagnosis not present

## 2020-04-06 DIAGNOSIS — M5136 Other intervertebral disc degeneration, lumbar region: Secondary | ICD-10-CM | POA: Diagnosis not present

## 2020-04-06 DIAGNOSIS — Z79899 Other long term (current) drug therapy: Secondary | ICD-10-CM | POA: Diagnosis not present

## 2020-04-06 DIAGNOSIS — M25559 Pain in unspecified hip: Secondary | ICD-10-CM | POA: Diagnosis not present

## 2020-04-06 DIAGNOSIS — Z86018 Personal history of other benign neoplasm: Secondary | ICD-10-CM | POA: Diagnosis not present

## 2020-04-06 DIAGNOSIS — M533 Sacrococcygeal disorders, not elsewhere classified: Secondary | ICD-10-CM | POA: Diagnosis not present

## 2020-04-06 DIAGNOSIS — M47896 Other spondylosis, lumbar region: Secondary | ICD-10-CM | POA: Diagnosis not present

## 2020-04-06 DIAGNOSIS — M545 Low back pain: Secondary | ICD-10-CM | POA: Diagnosis not present

## 2020-04-06 DIAGNOSIS — G894 Chronic pain syndrome: Secondary | ICD-10-CM | POA: Diagnosis not present

## 2020-04-17 ENCOUNTER — Telehealth: Payer: Self-pay | Admitting: Internal Medicine

## 2020-04-17 NOTE — Telephone Encounter (Signed)
Does 150 mg of trazodone work?  If so, she can take that.

## 2020-04-17 NOTE — Telephone Encounter (Signed)
Pt said she wanted to see if she could increase trazodone. Pt stated that 50 mg was not working and that she has taken 2-50mg s and that has not worked. Pt has even taken 3- 50mg  pills. Told pt not to take anymore than what is prescribed until there is a change made from Dr. Army Melia. Told pt Dr. Army Melia will be out of the office this week. Stressed to patient to only take 50 mg. Pt verbalized understanding. If there is an increase pt wants it sent to [place].  KP

## 2020-04-17 NOTE — Telephone Encounter (Signed)
Patient called to ask the nurse to call her regarding her medication for traZODone (DESYREL) 50 MG tablet.  She wanted to know if it could be increased.  Please advise and call patient to discuss at [phone]

## 2020-04-18 ENCOUNTER — Other Ambulatory Visit: Payer: Self-pay

## 2020-04-18 DIAGNOSIS — F5101 Primary insomnia: Secondary | ICD-10-CM

## 2020-04-18 MED ORDER — TRAZODONE HCL 150 MG PO TABS: 150.0000 mg | ORAL_TABLET | Freq: Every evening | ORAL | 1 refills | Status: DC | PRN

## 2020-04-18 NOTE — Telephone Encounter (Signed)
Sent in 150 mg to [facility] in [place].   CM

## 2020-04-24 DIAGNOSIS — I480 Paroxysmal atrial fibrillation: Secondary | ICD-10-CM | POA: Diagnosis not present

## 2020-04-24 DIAGNOSIS — K222 Esophageal obstruction: Secondary | ICD-10-CM | POA: Diagnosis not present

## 2020-04-24 DIAGNOSIS — G40909 Epilepsy, unspecified, not intractable, without status epilepticus: Secondary | ICD-10-CM | POA: Diagnosis not present

## 2020-04-24 DIAGNOSIS — I509 Heart failure, unspecified: Secondary | ICD-10-CM | POA: Diagnosis not present

## 2020-04-24 DIAGNOSIS — Z79899 Other long term (current) drug therapy: Secondary | ICD-10-CM | POA: Diagnosis not present

## 2020-04-24 DIAGNOSIS — K219 Gastro-esophageal reflux disease without esophagitis: Secondary | ICD-10-CM | POA: Diagnosis not present

## 2020-04-24 DIAGNOSIS — Z20822 Contact with and (suspected) exposure to covid-19: Secondary | ICD-10-CM | POA: Diagnosis not present

## 2020-04-24 DIAGNOSIS — J45909 Unspecified asthma, uncomplicated: Secondary | ICD-10-CM | POA: Diagnosis not present

## 2020-04-24 DIAGNOSIS — Z888 Allergy status to other drugs, medicaments and biological substances status: Secondary | ICD-10-CM | POA: Diagnosis not present

## 2020-04-24 DIAGNOSIS — R531 Weakness: Secondary | ICD-10-CM | POA: Diagnosis not present

## 2020-04-24 DIAGNOSIS — E039 Hypothyroidism, unspecified: Secondary | ICD-10-CM | POA: Diagnosis not present

## 2020-04-24 DIAGNOSIS — I517 Cardiomegaly: Secondary | ICD-10-CM | POA: Diagnosis not present

## 2020-04-24 DIAGNOSIS — I110 Hypertensive heart disease with heart failure: Secondary | ICD-10-CM | POA: Diagnosis not present

## 2020-04-24 DIAGNOSIS — I10 Essential (primary) hypertension: Secondary | ICD-10-CM | POA: Diagnosis not present

## 2020-04-24 DIAGNOSIS — E871 Hypo-osmolality and hyponatremia: Secondary | ICD-10-CM | POA: Diagnosis not present

## 2020-04-24 DIAGNOSIS — I6522 Occlusion and stenosis of left carotid artery: Secondary | ICD-10-CM | POA: Diagnosis not present

## 2020-04-24 DIAGNOSIS — Z8673 Personal history of transient ischemic attack (TIA), and cerebral infarction without residual deficits: Secondary | ICD-10-CM | POA: Diagnosis not present

## 2020-04-24 DIAGNOSIS — Z596 Low income: Secondary | ICD-10-CM | POA: Diagnosis not present

## 2020-04-24 DIAGNOSIS — Z7901 Long term (current) use of anticoagulants: Secondary | ICD-10-CM | POA: Diagnosis not present

## 2020-04-24 DIAGNOSIS — R569 Unspecified convulsions: Secondary | ICD-10-CM | POA: Diagnosis not present

## 2020-04-24 DIAGNOSIS — R5383 Other fatigue: Secondary | ICD-10-CM | POA: Diagnosis not present

## 2020-04-24 DIAGNOSIS — I639 Cerebral infarction, unspecified: Secondary | ICD-10-CM | POA: Diagnosis not present

## 2020-04-24 DIAGNOSIS — E876 Hypokalemia: Secondary | ICD-10-CM | POA: Diagnosis not present

## 2020-04-24 DIAGNOSIS — R29818 Other symptoms and signs involving the nervous system: Secondary | ICD-10-CM | POA: Diagnosis not present

## 2020-04-24 DIAGNOSIS — Z885 Allergy status to narcotic agent status: Secondary | ICD-10-CM | POA: Diagnosis not present

## 2020-04-24 DIAGNOSIS — Z5181 Encounter for therapeutic drug level monitoring: Secondary | ICD-10-CM | POA: Diagnosis not present

## 2020-04-24 DIAGNOSIS — Z87891 Personal history of nicotine dependence: Secondary | ICD-10-CM | POA: Diagnosis not present

## 2020-04-24 DIAGNOSIS — Z7951 Long term (current) use of inhaled steroids: Secondary | ICD-10-CM | POA: Diagnosis not present

## 2020-04-25 DIAGNOSIS — I63412 Cerebral infarction due to embolism of left middle cerebral artery: Secondary | ICD-10-CM | POA: Diagnosis not present

## 2020-04-25 DIAGNOSIS — R569 Unspecified convulsions: Secondary | ICD-10-CM | POA: Diagnosis not present

## 2020-04-25 DIAGNOSIS — I517 Cardiomegaly: Secondary | ICD-10-CM | POA: Diagnosis not present

## 2020-04-25 DIAGNOSIS — I6522 Occlusion and stenosis of left carotid artery: Secondary | ICD-10-CM | POA: Diagnosis not present

## 2020-04-25 DIAGNOSIS — R531 Weakness: Secondary | ICD-10-CM | POA: Diagnosis not present

## 2020-04-25 DIAGNOSIS — I639 Cerebral infarction, unspecified: Secondary | ICD-10-CM | POA: Diagnosis not present

## 2020-04-27 ENCOUNTER — Telehealth: Payer: Self-pay | Admitting: Internal Medicine

## 2020-04-27 NOTE — Telephone Encounter (Signed)
Called pt tried to schedule an appt for Monday 9/27. Pt said they she could not wait that long to be seen. Pt said that she would just wait until [place] calls her to let her know about a stroke clinic. There are no available appts for pt to be seen sooner. Told pt if she wanted to be seen to give [place] a call. Pt verbalized understanding.  KP

## 2020-04-27 NOTE — Telephone Encounter (Signed)
Copied from [facility]ie #[id number]. Topic: Appointment Scheduling - Scheduling Inquiry for Clinic >> Apr 27, 2020 10:30 AM Scherrie Gerlach wrote: Reason for CRM: pt states she had a mini stroke and was released from hospital Tues. Pt states she was told to follow up with pcp in 2 days.  Pt states she cannot wait until Monday. She wants to come in today. Pt just now calling.

## 2020-05-04 ENCOUNTER — Ambulatory Visit: Payer: Self-pay | Admitting: Internal Medicine

## 2020-05-10 ENCOUNTER — Other Ambulatory Visit: Payer: Self-pay

## 2020-05-10 ENCOUNTER — Ambulatory Visit (INDEPENDENT_AMBULATORY_CARE_PROVIDER_SITE_OTHER): Payer: Medicare Other | Admitting: Internal Medicine

## 2020-05-10 ENCOUNTER — Encounter: Payer: Self-pay | Admitting: Internal Medicine

## 2020-05-10 VITALS — BP 122/74 | HR 76 | Ht 63.0 in | Wt 152.0 lb

## 2020-05-10 DIAGNOSIS — I639 Cerebral infarction, unspecified: Secondary | ICD-10-CM | POA: Diagnosis not present

## 2020-05-10 DIAGNOSIS — R3915 Urgency of urination: Secondary | ICD-10-CM | POA: Diagnosis not present

## 2020-05-10 DIAGNOSIS — Z23 Encounter for immunization: Secondary | ICD-10-CM

## 2020-05-10 DIAGNOSIS — M47816 Spondylosis without myelopathy or radiculopathy, lumbar region: Secondary | ICD-10-CM | POA: Diagnosis not present

## 2020-05-10 DIAGNOSIS — E785 Hyperlipidemia, unspecified: Secondary | ICD-10-CM

## 2020-05-10 MED ORDER — ATORVASTATIN CALCIUM 20 MG PO TABS: 20.0000 mg | ORAL_TABLET | Freq: Every day | ORAL | 5 refills | Status: DC

## 2020-05-10 NOTE — Progress Notes (Signed)
Date:  05/10/2020   Name:  Crystal Zavala   DOB:  09-Sep-1931   MRN:  [id number]   Chief Complaint: ER Follow up and Urinary Incontinence (uses the bathroom 6-7 times at night. Having accidents and not making it to the bathroom. ) ER follow up for stroke symptoms.  [facility] ER 04/24/20 - 04/25/20.  HPI on INITIAL Encounter  Crystal Zavala is a 86 y.o. female with hx of HTN, GERD, hypothyroidism, asthma, paroxysmal atrial fibrillation, electrolyte abnormalities, diverticulitis presenting with R arm movements and generalized fatigue. Back to baseline, not candidate for tPA or thrombectomy.   Interim hx & results: -MRI brain done, which showed small punctate infarct in left parietal lobe.  -EEG: normal -patient back to baseline  Intake ROS: Comprehensive 10 point review of systems completed and patient does not affirm to any positive findings other than what is described above   Assessment and Plan   Crystal Zavala a 86 y.o.Femalehx of HTN, GERD, hypothyroidism, asthma, paroxysmal atrial fibrillation, electrolyte abnormalities, diverticulitis presenting with R arm movements and generalized fatigue. Differential includes stroke, TIA, seizure, stress response, myoclonus. From presentation of upward jerking movement of R arm followed by fatigue there was concern for seizure activity with post-ictal state. EEG was negative, though does not entirely rule out seizures. However, due to her higher risk for stroke with history of atrial fibrillation and being off anti-coagulation for several days, MRI was obtained which showed small punctate infarct in the left parietal lobe. New infarct likely does explain the abnormal RUE movements and will complete stroke work-up including ECHO, labs, and CTA head and neck.   UPDATED RECOMMENDATIONS: -Stroke evaluation: ECHO, labs (LDL, TSH, A1C), CTA head and neck -Start atorvastatin 40 mg daily  -Continue to monitor for any abnormal movements/actvity   Patient  discussed with Dr. Virgina Evener who is in agreement with the above stated plan.   ECHO with bubble study: INTERPRETATION ---------------------------------------------------------------  NORMAL LEFT VENTRICULAR SYSTOLIC FUNCTION WITH MILD LVH  NORMAL RIGHT VENTRICULAR SYSTOLIC FUNCTION  VALVULAR REGURGITATION: TRIVIAL AR, TRIVIAL MR, TRIVIAL PR, TRIVIAL TR  NO VALVULAR STENOSIS  NEGATIVE SALINE CONTRAST STUDY AT REST AND WITH VALSALVA   Compared with prior Echo study on 12/30/2019: NO SIGNIFICANT CHANGES C/W  [facility]   CT head and neck angiogram IMPRESSION:  1.No acute intracranial large vessel occlusion.  2. 50% stenosis of the proximal left ICA.   MRI Brain IMPRESSION:  Punctate focus of increased DWI signal in the left parietal lobe, likely an  acute infarct. No other evidence of acute intracranial process. No seizure  etiology identified.   HPI  Lab Results  Component Value Date   CREATININE 0.91 02/23/2020   BUN 11 02/23/2020   NA 140 02/23/2020   K 4.1 02/23/2020   CL 103 02/23/2020   CO2 24 02/23/2020   Lab Results  Component Value Date   CHOL 240 (H) 08/26/2019   HDL 78 08/26/2019   LDLCALC 147 (H) 08/26/2019   TRIG 85 08/26/2019   CHOLHDL 3.1 08/26/2019   Lab Results  Component Value Date   TSH 1.800 02/23/2020   Lab Results  Component Value Date   HGBA1C 5.2 04/22/2017   Lab Results  Component Value Date   WBC 10.8 08/26/2019   HGB 14.0 08/26/2019   HCT 42.2 08/26/2019   MCV 95 08/26/2019   PLT 265 08/26/2019   Lab Results  Component Value Date   ALT 13 08/26/2019   AST 14 08/26/2019  ALKPHOS 104 08/26/2019   BILITOT 0.5 08/26/2019     Review of Systems  Constitutional: Negative for appetite change, fatigue, fever and unexpected weight change.  HENT: Negative for tinnitus and trouble swallowing.   Eyes: Negative for visual disturbance.  Respiratory: Negative for cough, chest tightness and shortness of breath.     Cardiovascular: Negative for chest pain, palpitations and leg swelling (resolved with hctz).  Gastrointestinal: Negative for abdominal pain.  Endocrine: Negative for polydipsia and polyuria.  Genitourinary: Positive for frequency (since taking hctz - up multiple times per night) and urgency. Negative for dysuria and hematuria.  Musculoskeletal: Negative for arthralgias.  Neurological: Negative for tremors, numbness and headaches.  Psychiatric/Behavioral: Negative for dysphoric mood. The patient is nervous/anxious.     Patient Active Problem List   Diagnosis Date Noted  . Cerebrovascular accident (CVA) ([place]) 05/10/2020  . Decreased functional activity tolerance 02/01/2020  . Acquired thrombophilia ([place]) 01/18/2020  . Paroxysmal atrial fibrillation ([place]) 02/23/2019  . Allergy to tramadol 09/03/2018  . Pain of both hip joints 04/22/2017  . Renal insufficiency 01/05/2017  . 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 Spondylosis of lumbar region without myelopathy or radiculopathy 10/21/2016  . Hypothyroidism due to acquired atrophy of thyroid 04/22/2016  . Insomnia 04/22/2016  . Breast mass, right 01/10/2016  . Aortic atherosclerosis ([place]) 01/18/2015  . Arthritis of neck 01/18/2015  . Dermatitis, eczematoid 01/18/2015  . Essential (primary) hypertension 01/18/2015  . Esophagitis, reflux 01/18/2015  . H/O adenomatous polyp of colon 01/18/2015  . Migraine without aura and responsive to treatment 01/18/2015  . Asthma, mild intermittent 01/18/2015  . Hyperlipidemia, mild 01/18/2015  . Lung nodule, multiple 01/18/2015  . Kidney cysts 01/18/2015  . Tobacco use disorder, moderate, in sustained remission 01/18/2015  . Temporary cerebral vascular dysfunction 01/18/2015  . Dysphagia 10/26/2013    Allergies  Allergen Reactions  . Amlodipine Swelling  . Tramadol Nausea Only and Rash  . Ace Inhibitors Cough    Past Surgical History:  Procedure Laterality  Date  . ABDOMINAL HYSTERECTOMY    . BLEPHAROPLASTY Bilateral 2013  . BREAST BIOPSY Left 2009   benign  . COLONOSCOPY  2006   Normal  . ESI  01/2017   L5-S1 @ [facility]  . ESOPHAGOGASTRODUODENOSCOPY  2015   Dilation of esophageal stricture  . TOTAL HIP ARTHROPLASTY Right 2019    Social History   Tobacco Use  . Smoking status: Former Smoker    Packs/day: 0.50    Years: 39.00    Pack years: 19.50    Types: Cigarettes    Quit date: 1992    Years since quitting: 29.7  . Smokeless tobacco: Never Used  . Tobacco comment: smoking cessation materails not required  Vaping Use  . Vaping Use: Never used  Substance Use Topics  . Alcohol use: No    Alcohol/week: 0.0 standard drinks  . Drug use: No     Medication list has been reviewed and updated.  Current Meds  Medication Sig  . albuterol (VENTOLIN HFA) 108 (90 Base) MCG/ACT inhaler Inhale into the lungs every 6 (six) hours as needed for wheezing or shortness of breath.  . cetirizine (ZYRTEC) 10 MG tablet Take 10 mg by mouth daily.  Marland Kitchen diltiazem (TIAZAC) 120 MG 24 hr capsule Take 120 mg by mouth daily.   Marland Kitchen ELIQUIS 5 MG TABS tablet Take 5 mg by mouth 2 (two) times daily.  Marland Kitchen Emollient [facility]) OINT Apply topically in the morning and at  bedtime.   . Fluticasone-Salmeterol (ADVAIR DISKUS) 250-50 MCG/DOSE AEPB Inhale 1 puff into the lungs 2 (two) times daily.  Marland Kitchen HYDROcodone-acetaminophen (NORCO/VICODIN) 5-325 MG tablet Take 1 tablet by mouth every 6 (six) hours as needed for moderate pain.  Marland Kitchen levothyroxine (SYNTHROID) 88 MCG tablet Take 88 mcg by mouth daily before breakfast. Monday through Saturday  And take 1/2 tablet on Sundays.  . magnesium chloride (SLOW-MAG) 64 MG TBEC SR tablet Take by mouth.  . ondansetron (ZOFRAN) 4 MG tablet Take 4 mg by mouth 2 (two) times daily.  . pantoprazole (PROTONIX) 40 MG tablet TAKE 1 TABLET DAILY (Patient taking differently: No sig reported)  . spironolactone (ALDACTONE) 25 MG tablet Take 1 tablet (25  mg total) by mouth daily.  . traZODone (DESYREL) 150 MG tablet Take 1 tablet (150 mg total) by mouth at bedtime as needed for sleep.  . Vitamin D, Cholecalciferol, 50 MCG (2000 UT) CAPS Take 2 capsules by mouth daily.    PHQ 2/9 Scores 05/10/2020 02/23/2020 01/18/2020 12/01/2019  PHQ - 2 Score 0 0 0 0  PHQ- 9 Score 1 1 6  -    GAD 7 : Generalized Anxiety Score 05/10/2020 02/23/2020 01/18/2020  Nervous, Anxious, on Edge 0 0 1  Control/stop worrying 0 0 3  Worry too much - different things 0 0 3  Trouble relaxing 0 0 0  Restless 0 0 0  Easily annoyed or irritable 0 0 1  Afraid - awful might happen 0 0 0  Total GAD 7 Score 0 0 8  Anxiety Difficulty Not difficult at all Not difficult at all Not difficult at all    BP Readings from Last 3 Encounters:  05/10/20 122/74  04/03/20 132/80  02/23/20 (!) 153/85    Physical Exam Vitals and nursing note reviewed.  Constitutional:      General: She is not in acute distress.    Appearance: Normal appearance. She is well-developed.  HENT:     Head: Normocephalic and atraumatic.  Cardiovascular:     Rate and Rhythm: Normal rate and regular rhythm.     Pulses: Normal pulses.  Pulmonary:     Effort: Pulmonary effort is normal. No respiratory distress.     Breath sounds: No wheezing or rhonchi.  Musculoskeletal:     Cervical back: Normal range of motion.     Lumbar back: Tenderness present. Decreased range of motion.     Right lower leg: No edema.     Left lower leg: No edema.  Lymphadenopathy:     Cervical: No cervical adenopathy.  Skin:    General: Skin is warm and dry.     Capillary Refill: Capillary refill takes less than 2 seconds.     Findings: No rash.  Neurological:     General: No focal deficit present.     Mental Status: She is alert and oriented to person, place, and time.     Motor: Motor function is intact. No tremor, atrophy or seizure activity.     Coordination: Coordination is intact.     Gait: Gait abnormal (uses  rolator).  Psychiatric:        Behavior: Behavior normal.        Thought Content: Thought content normal.     Wt Readings from Last 3 Encounters:  05/10/20 152 lb (68.9 kg)  04/03/20 151 lb (68.5 kg)  02/23/20 155 lb (70.3 kg)    BP 122/74   Pulse 76   Ht 5\' 3"  (1.6 m)  Wt 152 lb (68.9 kg)   SpO2 98%   BMI 26.93 kg/m   Assessment and Plan: 1. Cerebrovascular accident (CVA), unspecified mechanism ([place]) Begin Lipitor Follow up labs in 7 weeks at next visit - atorvastatin (LIPITOR) 20 MG tablet; Take 1 tablet (20 mg total) by mouth daily.  Dispense: 30 tablet; Refill: 5  2. Hyperlipidemia, mild Begin Lipitor 20 mg daily  3. Spondylosis of lumbar region without myelopathy or radiculopathy Was off of Eliquis for several days for an ESI when the stroke occurred She is advised to wait 3 months for any procedure that requires her to stop Eliquis and to hold it for a max of 2 days in the future  4. Urinary urgency Stop HCTZ daily - see if edema recurs and if the urinary sx resolve Can then just use HCTZ as needed   Partially dictated using Editor, commissioning. Any errors are unintentional.  Halina Maidens, MD [facility]  05/10/2020

## 2020-05-11 ENCOUNTER — Other Ambulatory Visit: Payer: Self-pay | Admitting: Internal Medicine

## 2020-05-11 DIAGNOSIS — F5101 Primary insomnia: Secondary | ICD-10-CM

## 2020-06-17 DIAGNOSIS — R040 Epistaxis: Secondary | ICD-10-CM | POA: Diagnosis not present

## 2020-06-17 DIAGNOSIS — R059 Cough, unspecified: Secondary | ICD-10-CM | POA: Diagnosis not present

## 2020-06-17 DIAGNOSIS — Z7901 Long term (current) use of anticoagulants: Secondary | ICD-10-CM | POA: Diagnosis not present

## 2020-06-17 DIAGNOSIS — I4891 Unspecified atrial fibrillation: Secondary | ICD-10-CM | POA: Diagnosis not present

## 2020-06-19 DIAGNOSIS — J310 Chronic rhinitis: Secondary | ICD-10-CM | POA: Diagnosis not present

## 2020-06-19 DIAGNOSIS — R040 Epistaxis: Secondary | ICD-10-CM | POA: Diagnosis not present

## 2020-06-21 DIAGNOSIS — J4530 Mild persistent asthma, uncomplicated: Secondary | ICD-10-CM | POA: Diagnosis not present

## 2020-07-04 ENCOUNTER — Encounter: Payer: Self-pay | Admitting: Internal Medicine

## 2020-07-04 ENCOUNTER — Ambulatory Visit (INDEPENDENT_AMBULATORY_CARE_PROVIDER_SITE_OTHER): Payer: Medicare Other | Admitting: Internal Medicine

## 2020-07-04 ENCOUNTER — Other Ambulatory Visit: Payer: Self-pay

## 2020-07-04 VITALS — BP 116/72 | HR 75 | Ht 63.0 in | Wt 155.0 lb

## 2020-07-04 DIAGNOSIS — H1033 Unspecified acute conjunctivitis, bilateral: Secondary | ICD-10-CM | POA: Diagnosis not present

## 2020-07-04 DIAGNOSIS — I10 Essential (primary) hypertension: Secondary | ICD-10-CM

## 2020-07-04 DIAGNOSIS — I639 Cerebral infarction, unspecified: Secondary | ICD-10-CM

## 2020-07-04 DIAGNOSIS — N3281 Overactive bladder: Secondary | ICD-10-CM

## 2020-07-04 DIAGNOSIS — I480 Paroxysmal atrial fibrillation: Secondary | ICD-10-CM | POA: Diagnosis not present

## 2020-07-04 DIAGNOSIS — E034 Atrophy of thyroid (acquired): Secondary | ICD-10-CM | POA: Diagnosis not present

## 2020-07-04 MED ORDER — NEOMYCIN-POLYMYXIN-DEXAMETH 3.5-10000-0.1 OP SUSP: 2.0000 [drp] | Freq: Four times a day (QID) | OPHTHALMIC | 0 refills | Status: AC

## 2020-07-04 MED ORDER — MIRABEGRON ER 25 MG PO TB24: 25.0000 mg | ORAL_TABLET | Freq: Every day | ORAL | 2 refills | Status: DC

## 2020-07-04 NOTE — Progress Notes (Signed)
Date:  07/04/2020   Name:  Crystal Zavala   DOB:  1932/07/22   MRN:  [id number]   Chief Complaint: Hypertension  Hypertension This is a chronic problem. The problem is controlled. Pertinent negatives include no chest pain, headaches, palpitations or shortness of breath. Past treatments include diuretics and calcium channel blockers. The current treatment provides significant improvement. There are no compliance problems.  Hypertensive end-organ damage includes CAD/MI and CVA. She is doing much better - she is on her own now, driving and walking with a quad cane..  Conjunctivitis  The current episode started more than 1 week ago. The onset was gradual. The problem occurs continuously. The problem has been unchanged. The problem is mild. Associated symptoms include eye itching and eye redness. Pertinent negatives include no fever, no ear discharge, no headaches, no sore throat, no swollen glands and no eye pain. Both eyes are affected. OAB - had urinary frequency, esp when travelling, minor leakage upon standing.  Feels that her activities are limited due to the condition.  She denies hematuria, dysuria.  She has never taken medication for this condition or consulted a urologist.  Lab Results  Component Value Date   CREATININE 0.91 02/23/2020   BUN 11 02/23/2020   NA 140 02/23/2020   K 4.1 02/23/2020   CL 103 02/23/2020   CO2 24 02/23/2020   Lab Results  Component Value Date   CHOL 240 (H) 08/26/2019   HDL 78 08/26/2019   LDLCALC 147 (H) 08/26/2019   TRIG 85 08/26/2019   CHOLHDL 3.1 08/26/2019   Lab Results  Component Value Date   TSH 1.800 02/23/2020   Lab Results  Component Value Date   HGBA1C 5.2 04/22/2017   Lab Results  Component Value Date   WBC 10.8 08/26/2019   HGB 14.0 08/26/2019   HCT 42.2 08/26/2019   MCV 95 08/26/2019   PLT 265 08/26/2019   Lab Results  Component Value Date   ALT 13 08/26/2019   AST 14 08/26/2019   ALKPHOS 104 08/26/2019   BILITOT 0.5  08/26/2019     Review of Systems  Constitutional: Negative for chills, fatigue, fever and unexpected weight change.  HENT: Negative for ear discharge, sinus pressure, sinus pain and sore throat.   Eyes: Positive for redness, itching and visual disturbance. Negative for pain.  Respiratory: Negative for chest tightness and shortness of breath.   Cardiovascular: Negative for chest pain, palpitations and leg swelling.  Neurological: Positive for light-headedness (feels a bit like she is floating at times). Negative for dizziness and headaches.  Psychiatric/Behavioral: Negative for dysphoric mood and sleep disturbance. The patient is not nervous/anxious.     Patient Active Problem List   Diagnosis Date Noted  . Cerebrovascular accident (CVA) ([place]) 05/10/2020  . Decreased functional activity tolerance 02/01/2020  . Acquired thrombophilia ([place]) 01/18/2020  . Paroxysmal atrial fibrillation ([place]) 02/23/2019  . Allergy to tramadol 09/03/2018  . Pain of both hip joints 04/22/2017  . Renal insufficiency 01/05/2017  . 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 Spondylosis of lumbar region without myelopathy or radiculopathy 10/21/2016  . Hypothyroidism due to acquired atrophy of thyroid 04/22/2016  . Insomnia 04/22/2016  . Breast mass, right 01/10/2016  . Aortic atherosclerosis ([place]) 01/18/2015  . Arthritis of neck 01/18/2015  . Dermatitis, eczematoid 01/18/2015  . Essential (primary) hypertension 01/18/2015  . Esophagitis, reflux 01/18/2015  . H/O adenomatous polyp of colon 01/18/2015  . Migraine without aura and responsive  to treatment 01/18/2015  . Asthma, mild intermittent 01/18/2015  . Hyperlipidemia, mild 01/18/2015  . Lung nodule, multiple 01/18/2015  . Kidney cysts 01/18/2015  . Tobacco use disorder, moderate, in sustained remission 01/18/2015  . Temporary cerebral vascular dysfunction 01/18/2015  . Dysphagia 10/26/2013    Allergies  Allergen  Reactions  . Amlodipine Swelling  . Tramadol Nausea Only and Rash  . Ace Inhibitors Cough    Past Surgical History:  Procedure Laterality Date  . ABDOMINAL HYSTERECTOMY    . BLEPHAROPLASTY Bilateral 2013  . BREAST BIOPSY Left 2009   benign  . COLONOSCOPY  2006   Normal  . ESI  01/2017   L5-S1 @ [facility]  . ESOPHAGOGASTRODUODENOSCOPY  2015   Dilation of esophageal stricture  . TOTAL HIP ARTHROPLASTY Right 2019    Social History   Tobacco Use  . Smoking status: Former Smoker    Packs/day: 0.50    Years: 39.00    Pack years: 19.50    Types: Cigarettes    Quit date: 1992    Years since quitting: 29.9  . Smokeless tobacco: Never Used  . Tobacco comment: smoking cessation materails not required  Vaping Use  . Vaping Use: Never used  Substance Use Topics  . Alcohol use: No    Alcohol/week: 0.0 standard drinks  . Drug use: No     Medication list has been reviewed and updated.  Current Meds  Medication Sig  . albuterol (VENTOLIN HFA) 108 (90 Base) MCG/ACT inhaler Inhale into the lungs every 6 (six) hours as needed for wheezing or shortness of breath.  Marland Kitchen atorvastatin (LIPITOR) 20 MG tablet Take 1 tablet (20 mg total) by mouth daily.  . cetirizine (ZYRTEC) 10 MG tablet Take 10 mg by mouth daily.  Marland Kitchen diltiazem (TIAZAC) 120 MG 24 hr capsule Take 120 mg by mouth daily.   Marland Kitchen ELIQUIS 5 MG TABS tablet Take 5 mg by mouth 2 (two) times daily.  . Fluticasone-Salmeterol (ADVAIR DISKUS) 250-50 MCG/DOSE AEPB Inhale 1 puff into the lungs 2 (two) times daily.  Marland Kitchen HYDROcodone-acetaminophen (NORCO/VICODIN) 5-325 MG tablet Take 1 tablet by mouth every 6 (six) hours as needed for moderate pain.  Marland Kitchen levothyroxine (SYNTHROID) 88 MCG tablet Take 88 mcg by mouth daily before breakfast. Monday through Saturday  And take 1/2 tablet on Sundays.  . magnesium chloride (SLOW-MAG) 64 MG TBEC SR tablet Take by mouth.  . pantoprazole (PROTONIX) 40 MG tablet TAKE 1 TABLET DAILY (Patient taking differently: No  sig reported)  . spironolactone (ALDACTONE) 25 MG tablet Take 1 tablet (25 mg total) by mouth daily.  . traZODone (DESYREL) 150 MG tablet TAKE 1 TABLET (150 MG TOTAL) BY MOUTH AT BEDTIME AS NEEDED FOR SLEEP.  . Vitamin D, Cholecalciferol, 50 MCG (2000 UT) CAPS Take 2 capsules by mouth daily.    PHQ 2/9 Scores 07/04/2020 05/10/2020 02/23/2020 01/18/2020  PHQ - 2 Score 0 0 0 0  PHQ- 9 Score 0 1 1 6     GAD 7 : Generalized Anxiety Score 07/04/2020 05/10/2020 02/23/2020 01/18/2020  Nervous, Anxious, on Edge 0 0 0 1  Control/stop worrying 0 0 0 3  Worry too much - different things 0 0 0 3  Trouble relaxing 0 0 0 0  Restless 0 0 0 0  Easily annoyed or irritable 0 0 0 1  Afraid - awful might happen 0 0 0 0  Total GAD 7 Score 0 0 0 8  Anxiety Difficulty Not difficult at all Not difficult at  all Not difficult at all Not difficult at all    BP Readings from Last 3 Encounters:  07/04/20 116/72  05/10/20 122/74  04/03/20 132/80    Physical Exam Vitals and nursing note reviewed.  Constitutional:      General: She is not in acute distress.    Appearance: She is well-developed.  HENT:     Head: Normocephalic and atraumatic.  Eyes:     General: Lids are normal.     Extraocular Movements: Extraocular movements intact.     Conjunctiva/sclera:     Right eye: Chemosis present. No exudate.    Left eye: Chemosis present. No exudate. Cardiovascular:     Rate and Rhythm: Normal rate and regular rhythm.     Pulses: Normal pulses.     Heart sounds: No murmur heard.   Pulmonary:     Effort: Pulmonary effort is normal. No respiratory distress.     Breath sounds: No wheezing or rhonchi.  Musculoskeletal:        General: Normal range of motion.     Cervical back: Normal range of motion.  Lymphadenopathy:     Cervical: No cervical adenopathy.  Skin:    General: Skin is warm and dry.     Findings: No rash.  Neurological:     Mental Status: She is alert and oriented to person, place, and time.    Psychiatric:        Attention and Perception: Attention normal.        Mood and Affect: Mood normal.     Wt Readings from Last 3 Encounters:  07/04/20 155 lb (70.3 kg)  05/10/20 152 lb (68.9 kg)  04/03/20 151 lb (68.5 kg)    BP 116/72   Pulse 75   Ht 5\' 3"  (1.6 m)   Wt 155 lb (70.3 kg)   SpO2 99%   BMI 27.46 kg/m   Assessment and Plan: 1. Essential (primary) hypertension Clinically stable exam with well controlled BP. Tolerating medications without side effects at this time. Pt to continue current regimen and low sodium diet; benefits of regular exercise as able discussed.  2. Acute bacterial conjunctivitis of both eyes Warm compresses as needed - neomycin-polymyxin b-dexamethasone (MAXITROL) 3.5-10000-0.1 SUSP; Place 2 drops into both eyes every 6 (six) hours for 10 days.  Dispense: 5 mL; Refill: 0  3. Cerebrovascular accident (CVA), unspecified mechanism ([place]) Doing well on Eliquis without bleeding issues  4. Hypothyroidism due to acquired atrophy of thyroid Adjusting dose to achieve euthyroid state - TSH had increased for unclear reason - TSH + free T4  5. Paroxysmal atrial fibrillation (H[facility]) In SR today Rate normal on CCB  6. Overactive bladder Will try myrbetriq 25 mg daily - mirabegron ER (MYRBETRIQ) 25 MG TB24 tablet; Take 1 tablet (25 mg total) by mouth daily.  Dispense: 30 tablet; Refill: 2   Partially dictated using Editor, commissioning. Any errors are unintentional.  Halina Maidens, MD [facility]  07/04/2020

## 2020-07-05 ENCOUNTER — Other Ambulatory Visit: Payer: Self-pay | Admitting: Internal Medicine

## 2020-07-05 DIAGNOSIS — N3281 Overactive bladder: Secondary | ICD-10-CM

## 2020-07-05 DIAGNOSIS — M5416 Radiculopathy, lumbar region: Secondary | ICD-10-CM | POA: Diagnosis not present

## 2020-07-05 LAB — TSH+FREE T4
Free T4: 1.55 ng/dL (ref 0.82–1.77)
TSH: 5.46 u[IU]/mL — ABNORMAL HIGH (ref 0.450–4.500)

## 2020-07-05 MED ORDER — OXYBUTYNIN CHLORIDE ER 5 MG PO TB24: 5.0000 mg | ORAL_TABLET | Freq: Every day | ORAL | 0 refills | Status: DC

## 2020-07-05 NOTE — Progress Notes (Signed)
Pt informed me on the call that Myrbetriq was not covered by insurance a 30 day supply is $415.  KP

## 2020-07-17 ENCOUNTER — Other Ambulatory Visit: Payer: Self-pay | Admitting: Internal Medicine

## 2020-07-17 ENCOUNTER — Telehealth: Payer: Self-pay | Admitting: Internal Medicine

## 2020-07-17 DIAGNOSIS — B370 Candidal stomatitis: Secondary | ICD-10-CM

## 2020-07-17 MED ORDER — NYSTATIN 100000 UNIT/ML MT SUSP: 5.0000 mL | Freq: Four times a day (QID) | OROMUCOSAL | 0 refills | Status: DC

## 2020-07-17 NOTE — Telephone Encounter (Signed)
I reviewed her chart and do not see anywhere that I treated her for thrush in the past 6 years.  Is she sure it was me?  And how sure is she that it is thrush?

## 2020-07-17 NOTE — Telephone Encounter (Signed)
Please Review.  KP

## 2020-07-17 NOTE — Telephone Encounter (Signed)
I sent her nystatin suspension - swish, gargle and spit 1 tsp four times per day.

## 2020-07-17 NOTE — Telephone Encounter (Signed)
Spoke with pt told her that a nystatin suspension was sent in. To swish, gargle and spit 1 tsp four times a day. Pt verbalized understanding.  KP

## 2020-07-17 NOTE — Telephone Encounter (Signed)
Pt states she has thrush in her mouth, and would like a prescription called in for her.  Pt states she lives in [place] and the dr usually call her something in.  Pt states she was just in 07/04/20 for appt   [place], [address] Phone:  [phone]  Fax:  [phone]

## 2020-07-17 NOTE — Telephone Encounter (Signed)
Mouth is blistered and yellow bumps/coated on the side. White patches on roof of mouth. Mouth feel like its on fire when she eats. Looks normal in the back of her throat. My mouth is dry.    KP

## 2020-08-14 ENCOUNTER — Telehealth: Payer: Self-pay | Admitting: Internal Medicine

## 2020-08-14 NOTE — Telephone Encounter (Signed)
Have her try 1/2 tablet of lipitor.  She should also come for an OV and labs.

## 2020-08-14 NOTE — Telephone Encounter (Signed)
Patient informed and will follow up in Feb at scheduled appt.

## 2020-08-14 NOTE — Telephone Encounter (Signed)
Pt is calling and she is having fatigue. Pt had fatigue before due to be on a statin drug. Pt believe the fatigue is coming from atorvastatin she starting in oct 2021 and she has been nausea for about a week every now and then. Pt still has an appetite. Please advise

## 2020-08-23 ENCOUNTER — Telehealth: Payer: Self-pay | Admitting: Internal Medicine

## 2020-08-23 NOTE — Telephone Encounter (Signed)
Called pt phone was busy will try again.  KP

## 2020-08-23 NOTE — Telephone Encounter (Unsigned)
Copied from [place] [id number]. Topic: General - Other >> Aug 23, 2020  2:43 PM Celene Kras wrote: Reason for CRM: Pt calling and is requesting to speak with nurse regarding her prescription, Atorvastatin. She states that even with the decrease in the medication as recommended by PCP that she is still feeling nauseated. Please advise.   [place], [address] [place] [address] Phone: [phone] Fax: [phone] Hours: Not open 24 hours

## 2020-08-24 ENCOUNTER — Telehealth: Payer: Self-pay

## 2020-08-24 NOTE — Telephone Encounter (Signed)
Spoke to pt she stated she feels like she cant tolerate atorvastatin. Pt is feeling very nauseated and say she has no energy and sometimes cant get up because lack of energy. Told pt per Dr. Ronnald Ramp to stop taking atorvastatin and eat a low fat diet. scheduled pt an appt to talk to Dr. Army Melia about changes. Pt refuse earlier appt due to having another appointment on that day. Pt verbalized understanding.  KP

## 2020-08-24 NOTE — Telephone Encounter (Signed)
Called pt left VM to call back.  KP 

## 2020-08-29 ENCOUNTER — Encounter: Payer: Medicare Other | Admitting: Internal Medicine

## 2020-09-14 ENCOUNTER — Other Ambulatory Visit: Payer: Self-pay | Admitting: Internal Medicine

## 2020-09-14 NOTE — Telephone Encounter (Signed)
Requested medications are due for refill today YES  Requested medications are on the active medication list YES   Notes to clinic Historical Provider, also labs out of date.

## 2020-09-14 NOTE — Telephone Encounter (Signed)
Medication Refill - Medication: levothyroxine (SYNTHROID) 88 MCG tablet Pt has been two days without it./ asked if it can be sent today/ please advise   Has the patient contacted their pharmacy? Yes.   (Agent: If no, request that the patient contact the pharmacy for the refill.) (Agent: If yes, when and what did the pharmacy advise?) they have contacted office with no response   Preferred Pharmacy (with phone number or street name):  [id number] - [place], [place] - [address] Phone:  [phone]  Fax:  [phone]       Agent: Please be advised that RX refills may take up to 3 business days. We ask that you follow-up with your pharmacy.

## 2020-09-15 ENCOUNTER — Encounter: Payer: Self-pay | Admitting: Internal Medicine

## 2020-09-15 ENCOUNTER — Ambulatory Visit (INDEPENDENT_AMBULATORY_CARE_PROVIDER_SITE_OTHER): Payer: Medicare Other | Admitting: Internal Medicine

## 2020-09-15 ENCOUNTER — Other Ambulatory Visit: Payer: Self-pay

## 2020-09-15 VITALS — BP 120/74 | HR 77 | Ht 62.0 in | Wt 154.0 lb

## 2020-09-15 DIAGNOSIS — I639 Cerebral infarction, unspecified: Secondary | ICD-10-CM | POA: Diagnosis not present

## 2020-09-15 DIAGNOSIS — D6869 Other thrombophilia: Secondary | ICD-10-CM | POA: Diagnosis not present

## 2020-09-15 DIAGNOSIS — E785 Hyperlipidemia, unspecified: Secondary | ICD-10-CM | POA: Diagnosis not present

## 2020-09-15 MED ORDER — ROSUVASTATIN CALCIUM 5 MG PO TABS: 5.0000 mg | ORAL_TABLET | Freq: Every day | ORAL | 3 refills | Status: DC

## 2020-09-15 MED ORDER — LEVOTHYROXINE SODIUM 88 MCG PO TABS
88.0000 ug | ORAL_TABLET | Freq: Every day | ORAL | 0 refills | Status: DC
Start: 2020-09-15 — End: 2020-12-13

## 2020-09-15 NOTE — Progress Notes (Signed)
Date:  09/15/2020   Name:  Crystal Zavala   DOB:  1931-08-30   MRN:  [id number]   Chief Complaint: Hyperlipidemia (Discuss medication. Patient says she cannot tolerate atorvastatin. Causing stomach to feel sick / nauseous. )  Hyperlipidemia This is a chronic problem. The problem is uncontrolled. Recent lipid tests were reviewed and are high. Pertinent negatives include no chest pain or shortness of breath. She is currently on no antihyperlipidemic treatment (atorvastatin caused nausea all day). Risk factors: hx of stroke in 2020.    Lab Results  Component Value Date   CREATININE 0.91 02/23/2020   BUN 11 02/23/2020   NA 140 02/23/2020   K 4.1 02/23/2020   CL 103 02/23/2020   CO2 24 02/23/2020   Lab Results  Component Value Date   CHOL 240 (H) 08/26/2019   HDL 78 08/26/2019   LDLCALC 147 (H) 08/26/2019   TRIG 85 08/26/2019   CHOLHDL 3.1 08/26/2019   Lab Results  Component Value Date   TSH 5.460 (H) 07/04/2020   Lab Results  Component Value Date   HGBA1C 5.2 04/22/2017   Lab Results  Component Value Date   WBC 10.8 08/26/2019   HGB 14.0 08/26/2019   HCT 42.2 08/26/2019   MCV 95 08/26/2019   PLT 265 08/26/2019   Lab Results  Component Value Date   ALT 13 08/26/2019   AST 14 08/26/2019   ALKPHOS 104 08/26/2019   BILITOT 0.5 08/26/2019     Review of Systems  Constitutional: Negative for chills, fatigue and fever.  Respiratory: Negative for cough, chest tightness and shortness of breath.   Cardiovascular: Positive for leg swelling (mild). Negative for chest pain and palpitations.  Musculoskeletal: Positive for gait problem (uses cane). Negative for arthralgias.  Hematological: Bruises/bleeds easily.  Psychiatric/Behavioral: Negative for sleep disturbance. The patient is not nervous/anxious.     Patient Active Problem List   Diagnosis Date Noted  . Cerebrovascular accident (CVA) ([place]) 05/10/2020  . Decreased functional activity tolerance 02/01/2020  .  Acquired thrombophilia ([place]) 01/18/2020  . Paroxysmal atrial fibrillation ([place]) 02/23/2019  . Allergy to tramadol 09/03/2018  . Pain of both hip joints 04/22/2017  . Renal insufficiency 01/05/2017  . 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 Spondylosis of lumbar region without myelopathy or radiculopathy 10/21/2016  . Hypothyroidism due to acquired atrophy of thyroid 04/22/2016  . Insomnia 04/22/2016  . Breast mass, right 01/10/2016  . Aortic atherosclerosis ([place]) 01/18/2015  . Arthritis of neck 01/18/2015  . Dermatitis, eczematoid 01/18/2015  . Essential (primary) hypertension 01/18/2015  . Esophagitis, reflux 01/18/2015  . H/O adenomatous polyp of colon 01/18/2015  . Migraine without aura and responsive to treatment 01/18/2015  . Asthma, mild intermittent 01/18/2015  . Hyperlipidemia, mild 01/18/2015  . Lung nodule, multiple 01/18/2015  . Kidney cysts 01/18/2015  . Tobacco use disorder, moderate, in sustained remission 01/18/2015  . Temporary cerebral vascular dysfunction 01/18/2015  . Dysphagia 10/26/2013    Allergies  Allergen Reactions  . Amlodipine Swelling  . Tramadol Nausea Only and Rash  . Ace Inhibitors Cough    Past Surgical History:  Procedure Laterality Date  . ABDOMINAL HYSTERECTOMY    . BLEPHAROPLASTY Bilateral 2013  . BREAST BIOPSY Left 2009   benign  . COLONOSCOPY  2006   Normal  . ESI  01/2017   L5-S1 @ [facility]  . ESOPHAGOGASTRODUODENOSCOPY  2015   Dilation of esophageal stricture  . TOTAL HIP ARTHROPLASTY Right 2019  Social History   Tobacco Use  . Smoking status: Former Smoker    Packs/day: 0.50    Years: 39.00    Pack years: 19.50    Types: Cigarettes    Quit date: 1992    Years since quitting: 30.1  . Smokeless tobacco: Never Used  . Tobacco comment: smoking cessation materails not required  Vaping Use  . Vaping Use: Never used  Substance Use Topics  . Alcohol use: No    Alcohol/week: 0.0 standard  drinks  . Drug use: No     Medication list has been reviewed and updated.  Current Meds  Medication Sig  . albuterol (VENTOLIN HFA) 108 (90 Base) MCG/ACT inhaler Inhale into the lungs every 6 (six) hours as needed for wheezing or shortness of breath.  . cetirizine (ZYRTEC) 10 MG tablet Take 10 mg by mouth daily.  Marland Kitchen diltiazem (TIAZAC) 120 MG 24 hr capsule Take 120 mg by mouth daily.   Marland Kitchen ELIQUIS 5 MG TABS tablet Take 5 mg by mouth 2 (two) times daily.  . Fluticasone-Salmeterol (ADVAIR DISKUS) 250-50 MCG/DOSE AEPB Inhale 1 puff into the lungs 2 (two) times daily.  Marland Kitchen HYDROcodone-acetaminophen (NORCO/VICODIN) 5-325 MG tablet Take 1 tablet by mouth every 6 (six) hours as needed for moderate pain.  . magnesium chloride (SLOW-MAG) 64 MG TBEC SR tablet Take by mouth.  . oxybutynin (DITROPAN XL) 5 MG 24 hr tablet Take 1 tablet (5 mg total) by mouth at bedtime.  . pantoprazole (PROTONIX) 40 MG tablet TAKE 1 TABLET DAILY (Patient taking differently: No sig reported)  . spironolactone (ALDACTONE) 25 MG tablet Take 1 tablet (25 mg total) by mouth daily.  . traZODone (DESYREL) 150 MG tablet TAKE 1 TABLET (150 MG TOTAL) BY MOUTH AT BEDTIME AS NEEDED FOR SLEEP.  . Vitamin D, Cholecalciferol, 50 MCG (2000 UT) CAPS Take 2 capsules by mouth daily.  . [DISCONTINUED] levothyroxine (SYNTHROID) 88 MCG tablet Take 88 mcg by mouth daily before breakfast. Monday through Saturday  And take 1/2 tablet on Sundays.    PHQ 2/9 Scores 09/15/2020 07/04/2020 05/10/2020 02/23/2020  PHQ - 2 Score 0 0 0 0  PHQ- 9 Score 0 0 1 1    GAD 7 : Generalized Anxiety Score 09/15/2020 07/04/2020 05/10/2020 02/23/2020  Nervous, Anxious, on Edge 0 0 0 0  Control/stop worrying 0 0 0 0  Worry too much - different things 0 0 0 0  Trouble relaxing 0 0 0 0  Restless 0 0 0 0  Easily annoyed or irritable 0 0 0 0  Afraid - awful might happen 0 0 0 0  Total GAD 7 Score 0 0 0 0  Anxiety Difficulty Not difficult at all Not difficult at all Not  difficult at all Not difficult at all    BP Readings from Last 3 Encounters:  09/15/20 120/74  07/04/20 116/72  05/10/20 122/74    Physical Exam Vitals and nursing note reviewed.  Constitutional:      General: She is not in acute distress.    Appearance: Normal appearance. She is well-developed.  HENT:     Head: Normocephalic and atraumatic.  Neck:     Vascular: No carotid bruit.  Cardiovascular:     Rate and Rhythm: Normal rate and regular rhythm.     Heart sounds: Heart sounds are distant.  Pulmonary:     Effort: Pulmonary effort is normal. No respiratory distress.     Breath sounds: No wheezing or rhonchi.  Musculoskeletal:  General: Normal range of motion.     Cervical back: Normal range of motion.     Right lower leg: Edema (trace ankle) present.     Left lower leg: Edema (trace ankle) present.  Lymphadenopathy:     Cervical: No cervical adenopathy.  Skin:    General: Skin is warm and dry.     Findings: Ecchymosis (extensive bruising anterior shins) present. No rash.  Neurological:     General: No focal deficit present.     Mental Status: She is alert and oriented to person, place, and time.     Gait: Gait abnormal (uses cane).  Psychiatric:        Mood and Affect: Mood and affect and mood normal.        Behavior: Behavior normal.     Wt Readings from Last 3 Encounters:  09/15/20 154 lb (69.9 kg)  07/04/20 155 lb (70.3 kg)  05/10/20 152 lb (68.9 kg)    BP 120/74 (BP Location: Right Wrist, Patient Position: Sitting, Cuff Size: Normal)   Pulse 77   Ht 5\' 2"  (1.575 m)   Wt 154 lb (69.9 kg)   SpO2 98%   BMI 28.17 kg/m   Assessment and Plan: 1. Hyperlipidemia, mild Stopped atorvastatin Will try low dose Crestor - call if not tolerated Will check labs at next visit - rosuvastatin (CRESTOR) 5 MG tablet; Take 1 tablet (5 mg total) by mouth daily.  Dispense: 90 tablet; Refill: 3  2. Cerebrovascular accident (CVA), unspecified mechanism  ([place]) Resolved - recommend statin therapy  3. Acquired thrombophilia ([place]) On Eliquis with easy bruising Continue to monitor May be able to come off of medication per cardiology   Partially dictated using [place]. Any errors are unintentional.  Halina Maidens, MD [facility]  09/15/2020

## 2020-09-25 NOTE — Telephone Encounter (Signed)
Encounter opened in error

## 2020-09-28 ENCOUNTER — Encounter: Payer: Medicare Other | Admitting: Internal Medicine

## 2020-10-30 ENCOUNTER — Other Ambulatory Visit: Payer: Self-pay | Admitting: Internal Medicine

## 2020-10-30 DIAGNOSIS — K2100 Gastro-esophageal reflux disease with esophagitis, without bleeding: Secondary | ICD-10-CM

## 2020-10-30 MED ORDER — PANTOPRAZOLE SODIUM 40 MG PO TBEC: 40.0000 mg | DELAYED_RELEASE_TABLET | Freq: Every day | ORAL | 1 refills | Status: DC

## 2020-10-30 NOTE — Telephone Encounter (Signed)
Medication Refill - Medication: pantoprazole (PROTONIX) 40 MG tablet [[id number]]   Has the patient contacted their pharmacy? Yes.    (Agent: If yes, when and what did the pharmacy advise?) Contact PCP.   Preferred Pharmacy (with phone number or street name):   [facility] [place], [place] Phone:  [phone]  Fax:  [phone]       Agent: Please be advised that RX refills may take up to 3 business days. We ask that you follow-up with your pharmacy.

## 2020-11-10 DIAGNOSIS — R2681 Unsteadiness on feet: Secondary | ICD-10-CM | POA: Diagnosis not present

## 2020-11-10 DIAGNOSIS — R5381 Other malaise: Secondary | ICD-10-CM | POA: Diagnosis not present

## 2020-11-10 DIAGNOSIS — M25551 Pain in right hip: Secondary | ICD-10-CM | POA: Diagnosis not present

## 2020-11-10 DIAGNOSIS — M6281 Muscle weakness (generalized): Secondary | ICD-10-CM | POA: Diagnosis not present

## 2020-11-10 DIAGNOSIS — M5186 Other intervertebral disc disorders, lumbar region: Secondary | ICD-10-CM | POA: Diagnosis not present

## 2020-11-10 DIAGNOSIS — M169 Osteoarthritis of hip, unspecified: Secondary | ICD-10-CM | POA: Diagnosis not present

## 2020-11-10 DIAGNOSIS — M25552 Pain in left hip: Secondary | ICD-10-CM | POA: Diagnosis not present

## 2020-11-13 DIAGNOSIS — M169 Osteoarthritis of hip, unspecified: Secondary | ICD-10-CM | POA: Diagnosis not present

## 2020-11-13 DIAGNOSIS — M6281 Muscle weakness (generalized): Secondary | ICD-10-CM | POA: Diagnosis not present

## 2020-11-13 DIAGNOSIS — M25551 Pain in right hip: Secondary | ICD-10-CM | POA: Diagnosis not present

## 2020-11-13 DIAGNOSIS — R5381 Other malaise: Secondary | ICD-10-CM | POA: Diagnosis not present

## 2020-11-13 DIAGNOSIS — M25552 Pain in left hip: Secondary | ICD-10-CM | POA: Diagnosis not present

## 2020-11-13 DIAGNOSIS — M5186 Other intervertebral disc disorders, lumbar region: Secondary | ICD-10-CM | POA: Diagnosis not present

## 2020-11-13 DIAGNOSIS — R2681 Unsteadiness on feet: Secondary | ICD-10-CM | POA: Diagnosis not present

## 2020-11-20 DIAGNOSIS — R5381 Other malaise: Secondary | ICD-10-CM | POA: Diagnosis not present

## 2020-11-20 DIAGNOSIS — M6281 Muscle weakness (generalized): Secondary | ICD-10-CM | POA: Diagnosis not present

## 2020-11-20 DIAGNOSIS — M25552 Pain in left hip: Secondary | ICD-10-CM | POA: Diagnosis not present

## 2020-11-20 DIAGNOSIS — M25551 Pain in right hip: Secondary | ICD-10-CM | POA: Diagnosis not present

## 2020-11-20 DIAGNOSIS — M169 Osteoarthritis of hip, unspecified: Secondary | ICD-10-CM | POA: Diagnosis not present

## 2020-11-20 DIAGNOSIS — R2681 Unsteadiness on feet: Secondary | ICD-10-CM | POA: Diagnosis not present

## 2020-11-20 DIAGNOSIS — M5186 Other intervertebral disc disorders, lumbar region: Secondary | ICD-10-CM | POA: Diagnosis not present

## 2020-11-24 DIAGNOSIS — M5416 Radiculopathy, lumbar region: Secondary | ICD-10-CM | POA: Diagnosis not present

## 2020-11-26 ENCOUNTER — Other Ambulatory Visit: Payer: Self-pay | Admitting: Internal Medicine

## 2020-11-26 DIAGNOSIS — I10 Essential (primary) hypertension: Secondary | ICD-10-CM

## 2020-11-26 NOTE — Telephone Encounter (Signed)
Requested Prescriptions  Pending Prescriptions Disp Refills  . spironolactone (ALDACTONE) 25 MG tablet [Pharmacy Med Name: SPIRONOLACTONE 25 MG TABLET] 90 tablet 0    Sig: TAKE ONE TABLET BY MOUTH DAILY     Cardiovascular: Diuretics - Aldosterone Antagonist Passed - 11/26/2020  6:37 AM      Passed - Cr in normal range and within 360 days    Creatinine, Ser  Date Value Ref Range Status  02/23/2020 0.91 0.57 - 1.00 mg/dL Final         Passed - K in normal range and within 360 days    Potassium  Date Value Ref Range Status  02/23/2020 4.1 3.5 - 5.2 mmol/L Final         Passed - Na in normal range and within 360 days    Sodium  Date Value Ref Range Status  02/23/2020 140 134 - 144 mmol/L Final         Passed - Last BP in normal range    BP Readings from Last 1 Encounters:  09/15/20 120/74         Passed - Valid encounter within last 6 months    Recent Outpatient Visits          2 months ago Hyperlipidemia, mild   [facility] Glean Hess, MD   4 months ago Essential (primary) hypertension   [facility] Glean Hess, MD   6 months ago Cerebrovascular accident (CVA), unspecified mechanism [facility])   [facility] Glean Hess, MD   7 months ago Essential (primary) hypertension   [facility] Glean Hess, MD   9 months ago Bilateral leg edema   [facility] Glean Hess, MD      Future Appointments            In 4 weeks Army Melia Jesse Sans, MD [facility], [facility]

## 2020-12-04 ENCOUNTER — Ambulatory Visit (INDEPENDENT_AMBULATORY_CARE_PROVIDER_SITE_OTHER): Payer: Medicare Other | Admitting: Internal Medicine

## 2020-12-04 ENCOUNTER — Encounter: Payer: Self-pay | Admitting: Internal Medicine

## 2020-12-04 ENCOUNTER — Ambulatory Visit (INDEPENDENT_AMBULATORY_CARE_PROVIDER_SITE_OTHER): Payer: Medicare Other

## 2020-12-04 ENCOUNTER — Other Ambulatory Visit: Payer: Self-pay

## 2020-12-04 VITALS — BP 120/80 | HR 73 | Temp 98.0°F | Resp 16 | Ht 62.0 in | Wt 149.6 lb

## 2020-12-04 VITALS — BP 120/80 | HR 73 | Temp 98.0°F | Ht 62.0 in | Wt 149.6 lb

## 2020-12-04 DIAGNOSIS — I639 Cerebral infarction, unspecified: Secondary | ICD-10-CM | POA: Diagnosis not present

## 2020-12-04 DIAGNOSIS — B370 Candidal stomatitis: Secondary | ICD-10-CM | POA: Diagnosis not present

## 2020-12-04 DIAGNOSIS — Z0000 Encounter for general adult medical examination without abnormal findings: Secondary | ICD-10-CM

## 2020-12-04 MED ORDER — NYSTATIN 100000 UNIT/ML MT SUSP: 5.0000 mL | Freq: Four times a day (QID) | OROMUCOSAL | 0 refills | Status: DC

## 2020-12-04 NOTE — Progress Notes (Signed)
Subjective:   Crystal Zavala is a 87 y.o. female who presents for Medicare Annual (Subsequent) preventive examination.  Review of Systems     Cardiac Risk Factors include: advanced age (>5men, >37 women);hypertension;dyslipidemia     Objective:    Today's Vitals   12/04/20 1016  BP: 120/80  Pulse: 73  Resp: 16  Temp: 98 F (36.7 C)  TempSrc: Oral  SpO2: 96%  Weight: 149 lb 9.6 oz (67.9 kg)  Height: 5\' 2"  (1.575 m)  PainSc: 10-Worst pain ever   Body mass index is 27.36 kg/m.  Advanced Directives 12/04/2020 12/01/2019 11/30/2018 10/23/2017 10/21/2016 09/12/2015  Does Patient Have a Medical Advance Directive? Yes Yes Yes Yes Yes No  Type of Paramedic of [place];Living will [place];Living will [place];Living will [place];Living will [place];Living will -  Copy of [place] in Chart? No - copy requested No - copy requested No - copy requested No - copy requested No - copy requested -  Would patient like information on creating a medical advance directive? - - - - - No - patient declined information    Current Medications (verified) Outpatient Encounter Medications as of 12/04/2020  Medication Sig  . albuterol (VENTOLIN HFA) 108 (90 Base) MCG/ACT inhaler Inhale into the lungs every 6 (six) hours as needed for wheezing or shortness of breath.  . cetirizine (ZYRTEC) 10 MG tablet Take 10 mg by mouth daily.  Marland Kitchen diltiazem (TIAZAC) 120 MG 24 hr capsule Take 120 mg by mouth daily.   Marland Kitchen ELIQUIS 5 MG TABS tablet Take 5 mg by mouth 2 (two) times daily.  . Fluticasone-Salmeterol (ADVAIR DISKUS) 250-50 MCG/DOSE AEPB Inhale 1 puff into the lungs 2 (two) times daily.  Marland Kitchen HYDROcodone-acetaminophen (NORCO/VICODIN) 5-325 MG tablet Take 1 tablet by mouth every 6 (six) hours as needed for moderate pain.  Marland Kitchen levothyroxine (SYNTHROID) 88 MCG tablet Take 1 tablet (88 mcg total) by  mouth daily before breakfast. Monday through Saturday  And take 1/2 tablet on Sundays.  . magnesium chloride (SLOW-MAG) 64 MG TBEC SR tablet Take by mouth.  . oxybutynin (DITROPAN XL) 5 MG 24 hr tablet Take 1 tablet (5 mg total) by mouth at bedtime.  . pantoprazole (PROTONIX) 40 MG tablet Take 1 tablet (40 mg total) by mouth daily.  . rosuvastatin (CRESTOR) 5 MG tablet Take 1 tablet (5 mg total) by mouth daily.  Marland Kitchen spironolactone (ALDACTONE) 25 MG tablet TAKE ONE TABLET BY MOUTH DAILY  . traZODone (DESYREL) 150 MG tablet TAKE 1 TABLET (150 MG TOTAL) BY MOUTH AT BEDTIME AS NEEDED FOR SLEEP.  . Vitamin D, Cholecalciferol, 50 MCG (2000 UT) CAPS Take 2 capsules by mouth daily.   No facility-administered encounter medications on file as of 12/04/2020.    Allergies (verified) Amlodipine, Tramadol, and Ace inhibitors   History: Past Medical History:  Diagnosis Date  . Allergy to tramadol 09/03/2018  . Asthma   . GERD (gastroesophageal reflux disease)   . Hyperlipidemia   . Hypertension   . Osteoarthritis    in back  . Scoliosis   . Spondylosis   . Thyroid disease    Past Surgical History:  Procedure Laterality Date  . ABDOMINAL HYSTERECTOMY    . BLEPHAROPLASTY Bilateral 2013  . BREAST BIOPSY Left 2009   benign  . COLONOSCOPY  2006   Normal  . ESI  01/2017   L5-S1 @ [facility]  . ESOPHAGOGASTRODUODENOSCOPY  2015   Dilation of esophageal stricture  . TOTAL HIP ARTHROPLASTY Right 2019   Family History  Problem Relation Age of Onset  . Tuberculosis Mother   . Stroke Father   . Bipolar disorder Daughter   . Alzheimer's disease Brother   . Heart attack Brother   . Heart disease Brother   . Bipolar disorder Sister   . Diabetes Son   . Heart attack Brother   . Heart disease Brother   . Heart attack Brother   . Heart disease Brother    Social History   Socioeconomic History  . Marital status: Widowed    Spouse name: Not on file  . Number of children: 2  . Years of education: Not  on file  . Highest education level: 12th grade  Occupational History  . Occupation: Retired  Tobacco Use  . Smoking status: Former Smoker    Packs/day: 0.50    Years: 39.00    Pack years: 19.50    Types: Cigarettes    Quit date: 1992    Years since quitting: 30.3  . Smokeless tobacco: Never Used  . Tobacco comment: smoking cessation materails not required  Vaping Use  . Vaping Use: Never used  Substance and Sexual Activity  . Alcohol use: No    Alcohol/week: 0.0 standard drinks  . Drug use: No  . Sexual activity: Never  Other Topics Concern  . Not on file  Social History Narrative   Pt lives alone   Social Determinants of Health   Financial Resource Strain: Low Risk   . Difficulty of Paying Living Expenses: Not hard at all  Food Insecurity: No Food Insecurity  . Worried About Charity fundraiser in the Last Year: Never true  . Ran Out of Food in the Last Year: Never true  Transportation Needs: No Transportation Needs  . Lack of Transportation (Medical): No  . Lack of Transportation (Non-Medical): No  Physical Activity: Inactive  . Days of Exercise per Week: 0 days  . Minutes of Exercise per Session: 0 min  Stress: No Stress Concern Present  . Feeling of Stress : Not at all  Social Connections: Moderately Isolated  . Frequency of Communication with Friends and Family: More than three times a week  . Frequency of Social Gatherings with Friends and Family: More than three times a week  . Attends Religious Services: More than 4 times per year  . Active Member of Clubs or Organizations: No  . Attends Archivist Meetings: Never  . Marital Status: Widowed    Tobacco Counseling Counseling given: Not Answered Comment: smoking cessation materails not required   Clinical Intake:  Pre-visit preparation completed: Yes  Pain : 0-10 Pain Score: 10-Worst pain ever Pain Type: Chronic pain Pain Location: Back Pain Orientation: Lower Pain Descriptors /  Indicators: Aching,Sore,Discomfort Pain Onset: More than a month ago Pain Frequency: Constant     BMI - recorded: 27.36 Nutritional Status: BMI 25 -29 Overweight Nutritional Risks: None Diabetes: No  How often do you need to have someone help you when you read instructions, pamphlets, or other written materials from your doctor or pharmacy?: 1 - Never    Interpreter Needed?: No  Information entered by :: Clemetine Marker LPN   Activities of Daily Living In your present state of health, do you have any difficulty performing the following activities: 12/04/2020 07/04/2020  Hearing? N N  Comment declines hearing aids -  Vision? N N  Difficulty concentrating or making  decisions? N Y  Walking or climbing stairs? Y N  Dressing or bathing? N N  Doing errands, shopping? N N  Preparing Food and eating ? N -  Using the Toilet? N -  In the past six months, have you accidently leaked urine? Y -  Comment wears pads for protection -  Do you have problems with loss of bowel control? N -  Managing your Medications? N -  Managing your Finances? N -  Housekeeping or managing your Housekeeping? N -  Some recent data might be hidden    Patient Care Team: Glean Hess, MD as PCP - General (Internal Medicine) [place], Jimmy J, MD as Referring Physician (Physical Medicine and Rehabilitation)  Indicate any recent Medical Services you may have received from other than [facility] providers in the past year (date may be approximate).     Assessment:   This is a routine wellness examination for Crystal Zavala.  Hearing/Vision screen  Hearing Screening   125Hz  250Hz  500Hz  1000Hz  2000Hz  3000Hz  4000Hz  6000Hz  8000Hz   Right ear:           Left ear:           Comments: Pt denies hearing difficulty  Vision Screening Comments: Sees Dr. Francee Gentile for [facility] exams [facility]  Dietary issues and exercise activities discussed: Current Exercise Habits: The patient does not participate in regular exercise at  present, Exercise limited by: orthopedic condition(s)  Goals Addressed            This Visit's Progress   . DIET - INCREASE WATER INTAKE   On track    Recommend to drink at least 6-8 8oz glasses of water per day.      Depression Screen PHQ 2/9 Scores 12/04/2020 09/15/2020 07/04/2020 05/10/2020 02/23/2020 01/18/2020 12/01/2019  PHQ - 2 Score 0 0 0 0 0 0 0  PHQ- 9 Score - 0 0 1 1 6  -    Fall Risk Fall Risk  12/04/2020 09/15/2020 07/04/2020 05/10/2020 02/23/2020  Falls in the past year? 0 0 1 1 1   Number falls in past yr: 0 0 1 1 1   Injury with Fall? 0 0 0 0 1  Risk for fall due to : Impaired balance/gait - - History of fall(s) Impaired balance/gait;History of fall(s)  Risk for fall due to: Comment - - - - -  Follow up Falls prevention discussed Falls evaluation completed Falls evaluation completed Falls evaluation completed Falls evaluation completed    [place]:  Any stairs in or around the home? Yes  If so, are there any without handrails? No  Home free of loose throw rugs in walkways, pet beds, electrical cords, etc? Yes  Adequate lighting in your home to reduce risk of falls? Yes   ASSISTIVE DEVICES UTILIZED TO PREVENT FALLS:  Life alert? No  Use of a cane, walker or w/c? Yes  Grab bars in the bathroom? No  Shower chair or bench in shower? No  Elevated toilet seat or a handicapped toilet? Yes   TIMED UP AND GO:  Was the test performed? Yes .  Length of time to ambulate 10 feet: 6 sec.   Gait steady and fast with assistive device  Cognitive Function: Normal cognitive status assessed by direct observation by this Nurse Health Advisor. No abnormalities found.       6CIT Screen 12/01/2019 11/30/2018 10/23/2017 10/21/2016  What Year? 0 points 0 points 0 points 0 points  What month?  0 points 0 points 0 points 0 points  What time? 0 points 0 points 0 points 0 points  Count back from 20 0 points 0 points 0 points 0 points  Months in reverse 0  points 0 points 0 points 0 points  Repeat phrase 0 points 0 points 0 points 2 points  Total Score 0 0 0 2    Immunizations Immunization History  Administered Date(s) Administered  . Fluad Quad(high Dose 65+) 03/31/2019, 05/10/2020  . Influenza, High Dose Seasonal PF 04/22/2017  . Influenza,inj,Quad PF,6+ Mos 04/22/2016  . Influenza-Unspecified 06/05/2018  . PFIZER(Purple Top)SARS-COV-2 Vaccination 08/28/2019, 09/19/2019  . Pneumococcal Conjugate-13 09/13/2014  . Pneumococcal Polysaccharide-23 10/03/2004  . Tdap 08/27/2011    TDAP status: Up to date  Flu Vaccine status: Up to date  Pneumococcal vaccine status: Up to date  Covid-19 vaccine status: Completed vaccines  Qualifies for Shingles Vaccine? Yes   Zostavax completed No   Shingrix Completed?: No.    Education has been provided regarding the importance of this vaccine. Patient has been advised to call insurance company to determine out of pocket expense if they have not yet received this vaccine. Advised may also receive vaccine at local pharmacy or Health Dept. Verbalized acceptance and understanding.  Screening Tests Health Maintenance  Topic Date Due  . COVID-19 Vaccine (3 - Booster for Pfizer series) 03/18/2020  . MAMMOGRAM  10/24/2020  . INFLUENZA VACCINE  03/05/2021  . TETANUS/TDAP  08/26/2021  . DEXA SCAN  Completed  . PNA vac Low Risk Adult  Completed  . HPV VACCINES  Aged Out    Health Maintenance  Health Maintenance Due  Topic Date Due  . COVID-19 Vaccine (3 - Booster for Pfizer series) 03/18/2020  . MAMMOGRAM  10/24/2020    Colorectal cancer screening: No longer required.   Mammogram status: Completed 10/24/19. Repeat every year  Bone density screening: no longer required  Lung Cancer Screening: (Low Dose CT Chest recommended if Age 63-80 years, 30 pack-year currently smoking OR have quit w/in 15years.) does not qualify.   Additional Screening:  Hepatitis C Screening: does not qualify.    Vision Screening: Recommended annual ophthalmology exams for early detection of glaucoma and other disorders of the eye. Is the patient up to date with their annual eye exam?  No  Who is the provider or what is the name of the office in which the patient attends annual eye exams? [facility]  Dental Screening: Recommended annual dental exams for proper oral hygiene  Community Resource Referral / Chronic Care Management: CRR required this visit?  No   CCM required this visit?  No      Plan:     I have personally reviewed and noted the following in the patient's chart:   . Medical and social history . Use of alcohol, tobacco or illicit drugs  . Current medications and supplements including opioid prescriptions.  . Functional ability and status . Nutritional status . Physical activity . Advanced directives . List of other physicians . Hospitalizations, surgeries, and ER visits in previous 12 months . Vitals . Screenings to include cognitive, depression, and falls . Referrals and appointments  In addition, I have reviewed and discussed with patient certain preventive protocols, quality metrics, and best practice recommendations. A written personalized care plan for preventive services as well as general preventive health recommendations were provided to patient.     Clemetine Marker, LPN   11/09/9627   Nurse Notes: pt c/o soreness in roof of  mouth and tongue for the past week that "feels like it's on fire" pt states hx of thrush/yeast. Patient's tongue has visible white patches/irritation. Pt to see Dr. Army Melia today.

## 2020-12-04 NOTE — Progress Notes (Signed)
Date:  12/04/2020   Name:  Crystal Zavala   DOB:  1932-07-07   MRN:  [id number]   Chief Complaint: Thrush  Mouth Lesions  The current episode started 5 to 7 days ago. The onset was sudden. The problem occurs continuously. The problem has been unchanged. The problem is mild. Nothing relieves the symptoms. The symptoms are aggravated by eating. Associated symptoms include mouth sores. Pertinent negatives include no fever, no abdominal pain, no vomiting, no sore throat and no cough.    Lab Results  Component Value Date   CREATININE 0.91 02/23/2020   BUN 11 02/23/2020   NA 140 02/23/2020   K 4.1 02/23/2020   CL 103 02/23/2020   CO2 24 02/23/2020   Lab Results  Component Value Date   CHOL 240 (H) 08/26/2019   HDL 78 08/26/2019   LDLCALC 147 (H) 08/26/2019   TRIG 85 08/26/2019   CHOLHDL 3.1 08/26/2019   Lab Results  Component Value Date   TSH 5.460 (H) 07/04/2020   Lab Results  Component Value Date   HGBA1C 5.2 04/22/2017   Lab Results  Component Value Date   WBC 10.8 08/26/2019   HGB 14.0 08/26/2019   HCT 42.2 08/26/2019   MCV 95 08/26/2019   PLT 265 08/26/2019   Lab Results  Component Value Date   ALT 13 08/26/2019   AST 14 08/26/2019   ALKPHOS 104 08/26/2019   BILITOT 0.5 08/26/2019     Review of Systems  Constitutional: Negative for chills and fever.  HENT: Positive for mouth sores. Negative for sinus pressure, sore throat and trouble swallowing.   Respiratory: Negative for cough and chest tightness.   Cardiovascular: Negative for chest pain.  Gastrointestinal: Negative for abdominal pain and vomiting.    Patient Active Problem List   Diagnosis Date Noted  . Cerebrovascular accident (CVA) ([place]) 05/10/2020  . Decreased functional activity tolerance 02/01/2020  . Acquired thrombophilia ([place]) 01/18/2020  . Paroxysmal atrial fibrillation ([place]) 02/23/2019  . Allergy to tramadol 09/03/2018  . Pain of both hip joints 04/22/2017  . Renal insufficiency  01/05/2017  . 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 Spondylosis of lumbar region without myelopathy or radiculopathy 10/21/2016  . Hypothyroidism due to acquired atrophy of thyroid 04/22/2016  . Insomnia 04/22/2016  . Breast mass, right 01/10/2016  . Aortic atherosclerosis ([place]) 01/18/2015  . Arthritis of neck 01/18/2015  . Dermatitis, eczematoid 01/18/2015  . Essential (primary) hypertension 01/18/2015  . Esophagitis, reflux 01/18/2015  . H/O adenomatous polyp of colon 01/18/2015  . Migraine without aura and responsive to treatment 01/18/2015  . Asthma, mild intermittent 01/18/2015  . Hyperlipidemia, mild 01/18/2015  . Lung nodule, multiple 01/18/2015  . Kidney cysts 01/18/2015  . Tobacco use disorder, moderate, in sustained remission 01/18/2015  . Temporary cerebral vascular dysfunction 01/18/2015  . Dysphagia 10/26/2013    Allergies  Allergen Reactions  . Amlodipine Swelling  . Tramadol Nausea Only and Rash  . Ace Inhibitors Cough    Past Surgical History:  Procedure Laterality Date  . ABDOMINAL HYSTERECTOMY    . BLEPHAROPLASTY Bilateral 2013  . BREAST BIOPSY Left 2009   benign  . COLONOSCOPY  2006   Normal  . ESI  01/2017   L5-S1 @ [facility]  . ESOPHAGOGASTRODUODENOSCOPY  2015   Dilation of esophageal stricture  . TOTAL HIP ARTHROPLASTY Right 2019    Social History   Tobacco Use  . Smoking status: Former Smoker  Packs/day: 0.50    Years: 39.00    Pack years: 19.50    Types: Cigarettes    Quit date: 41    Years since quitting: 30.3  . Smokeless tobacco: Never Used  . Tobacco comment: smoking cessation materails not required  Vaping Use  . Vaping Use: Never used  Substance Use Topics  . Alcohol use: No    Alcohol/week: 0.0 standard drinks  . Drug use: No     Medication list has been reviewed and updated.  Current Meds  Medication Sig  . albuterol (VENTOLIN HFA) 108 (90 Base) MCG/ACT inhaler Inhale into the lungs  every 6 (six) hours as needed for wheezing or shortness of breath.  . cetirizine (ZYRTEC) 10 MG tablet Take 10 mg by mouth daily.  Marland Kitchen diltiazem (TIAZAC) 120 MG 24 hr capsule Take 120 mg by mouth daily.   Marland Kitchen ELIQUIS 5 MG TABS tablet Take 5 mg by mouth 2 (two) times daily.  . Fluticasone-Salmeterol (ADVAIR DISKUS) 250-50 MCG/DOSE AEPB Inhale 1 puff into the lungs 2 (two) times daily.  Marland Kitchen HYDROcodone-acetaminophen (NORCO/VICODIN) 5-325 MG tablet Take 1 tablet by mouth every 6 (six) hours as needed for moderate pain.  Marland Kitchen levothyroxine (SYNTHROID) 88 MCG tablet Take 1 tablet (88 mcg total) by mouth daily before breakfast. Monday through Saturday  And take 1/2 tablet on Sundays.  . magnesium chloride (SLOW-MAG) 64 MG TBEC SR tablet Take by mouth.  . oxybutynin (DITROPAN XL) 5 MG 24 hr tablet Take 1 tablet (5 mg total) by mouth at bedtime.  . pantoprazole (PROTONIX) 40 MG tablet Take 1 tablet (40 mg total) by mouth daily.  . rosuvastatin (CRESTOR) 5 MG tablet Take 1 tablet (5 mg total) by mouth daily.  Marland Kitchen spironolactone (ALDACTONE) 25 MG tablet TAKE ONE TABLET BY MOUTH DAILY  . traZODone (DESYREL) 150 MG tablet TAKE 1 TABLET (150 MG TOTAL) BY MOUTH AT BEDTIME AS NEEDED FOR SLEEP.  . Vitamin D, Cholecalciferol, 50 MCG (2000 UT) CAPS Take 2 capsules by mouth daily.    PHQ 2/9 Scores 12/04/2020 09/15/2020 07/04/2020 05/10/2020  PHQ - 2 Score 0 0 0 0  PHQ- 9 Score - 0 0 1    GAD 7 : Generalized Anxiety Score 09/15/2020 07/04/2020 05/10/2020 02/23/2020  Nervous, Anxious, on Edge 0 0 0 0  Control/stop worrying 0 0 0 0  Worry too much - different things 0 0 0 0  Trouble relaxing 0 0 0 0  Restless 0 0 0 0  Easily annoyed or irritable 0 0 0 0  Afraid - awful might happen 0 0 0 0  Total GAD 7 Score 0 0 0 0  Anxiety Difficulty Not difficult at all Not difficult at all Not difficult at all Not difficult at all    BP Readings from Last 3 Encounters:  12/04/20 120/80  12/04/20 120/80  09/15/20 120/74     Physical Exam Vitals and nursing note reviewed.  Constitutional:      General: She is not in acute distress.    Appearance: Normal appearance. She is well-developed.  HENT:     Head: Normocephalic and atraumatic.     Mouth/Throat:     Lips: Pink. No lesions.     Mouth: Mucous membranes are dry.     Tongue: Lesions (white coating) present.     Pharynx: Posterior oropharyngeal erythema present.     Comments: Buccal mucosa red/dry Cardiovascular:     Rate and Rhythm: Normal rate and regular rhythm.  Pulmonary:  Effort: Pulmonary effort is normal. No respiratory distress.  Musculoskeletal:     Cervical back: Normal range of motion.  Lymphadenopathy:     Cervical: No cervical adenopathy.  Skin:    General: Skin is warm and dry.     Findings: No rash.  Neurological:     Mental Status: She is alert and oriented to person, place, and time.  Psychiatric:        Mood and Affect: Mood normal.        Behavior: Behavior normal.     Wt Readings from Last 3 Encounters:  12/04/20 149 lb 9.6 oz (67.9 kg)  12/04/20 149 lb 9.6 oz (67.9 kg)  09/15/20 154 lb (69.9 kg)    BP 120/80   Pulse 73   Temp 98 F (36.7 C) (Oral)   Ht 5\' 2"  (1.575 m)   Wt 149 lb 9.6 oz (67.9 kg)   SpO2 96%   BMI 27.36 kg/m   Assessment and Plan: 1. Thrush, oral Likely due to use of inhaled steroid +/- recent lumbar injections - nystatin (MYCOSTATIN) 100000 UNIT/ML suspension; Take 5 mLs (500,000 Units total) by mouth 4 (four) times daily for 7 days. Swish, gargle and spit  Dispense: 473 mL; Refill: 0   Partially dictated using Editor, commissioning. Any errors are unintentional.  Halina Maidens, MD [facility]  12/04/2020

## 2020-12-04 NOTE — Patient Instructions (Signed)
Crystal Zavala , Thank you for taking time to come for your Medicare Wellness Visit. I appreciate your ongoing commitment to your health goals. Please review the following plan we discussed and let me know if I can assist you in the future.   Screening recommendations/referrals: Colonoscopy: no longer required Mammogram: done 10/24/19 Bone Density: no longer required Recommended yearly ophthalmology/optometry visit for glaucoma screening and checkup Recommended yearly dental visit for hygiene and checkup  Vaccinations: Influenza vaccine: done 05/10/20 Pneumococcal vaccine: done 09/13/14 Tdap vaccine: done 08/27/11 Shingles vaccine: Shingrix discussed. Please contact your pharmacy for coverage information.  Covid-19: done 08/28/19 & 09/19/19  Advanced directives: Please bring a copy of your health care power of attorney and living will to the office at your convenience.  Conditions/risks identified: Recommend increasing physical activity as tolerated  Next appointment: Follow up in one year for your annual wellness visit    Preventive Care 65 Years and Older, Female Preventive care refers to lifestyle choices and visits with your health care provider that can promote health and wellness. What does preventive care include?  A yearly physical exam. This is also called an annual well check.  Dental exams once or twice a year.  Routine eye exams. Ask your health care provider how often you should have your eyes checked.  Personal lifestyle choices, including:  Daily care of your teeth and gums.  Regular physical activity.  Eating a healthy diet.  Avoiding tobacco and drug use.  Limiting alcohol use.  Practicing safe sex.  Taking low-dose aspirin every day.  Taking vitamin and mineral supplements as recommended by your health care provider. What happens during an annual well check? The services and screenings done by your health care provider during your annual well check will  depend on your age, overall health, lifestyle risk factors, and family history of disease. Counseling  Your health care provider may ask you questions about your:  Alcohol use.  Tobacco use.  Drug use.  Emotional well-being.  Home and relationship well-being.  Sexual activity.  Eating habits.  History of falls.  Memory and ability to understand (cognition).  Work and work Statistician.  Reproductive health. Screening  You may have the following tests or measurements:  Height, weight, and BMI.  Blood pressure.  Lipid and cholesterol levels. These may be checked every 5 years, or more frequently if you are over 85 years old.  Skin check.  Lung cancer screening. You may have this screening every year starting at age 85 if you have a 30-pack-year history of smoking and currently smoke or have quit within the past 15 years.  Fecal occult blood test (FOBT) of the stool. You may have this test every year starting at age 25.  Flexible sigmoidoscopy or colonoscopy. You may have a sigmoidoscopy every 5 years or a colonoscopy every 10 years starting at age 85.  Hepatitis C blood test.  Hepatitis B blood test.  Sexually transmitted disease (STD) testing.  Diabetes screening. This is done by checking your blood sugar (glucose) after you have not eaten for a while (fasting). You may have this done every 1-3 years.  Bone density scan. This is done to screen for osteoporosis. You may have this done starting at age 85.  Mammogram. This may be done every 1-2 years. Talk to your health care provider about how often you should have regular mammograms. Talk with your health care provider about your test results, treatment options, and if necessary, the need for more tests. Vaccines  Your health care provider may recommend certain vaccines, such as:  Influenza vaccine. This is recommended every year.  Tetanus, diphtheria, and acellular pertussis (Tdap, Td) vaccine. You may need a  Td booster every 10 years.  Zoster vaccine. You may need this after age 85.  Pneumococcal 13-valent conjugate (PCV13) vaccine. One dose is recommended after age 85.  Pneumococcal polysaccharide (PPSV23) vaccine. One dose is recommended after age 85. Talk to your health care provider about which screenings and vaccines you need and how often you need them. This information is not intended to replace advice given to you by your health care provider. Make sure you discuss any questions you have with your health care provider. Document Released: 08/18/2015 Document Revised: 04/10/2016 Document Reviewed: 05/23/2015 Elsevier Interactive Patient Education  2017 [place] Prevention in the Home Falls can cause injuries. They can happen to people of all ages. There are many things you can do to make your home safe and to help prevent falls. What can I do on the outside of my home?  Regularly fix the edges of walkways and driveways and fix any cracks.  Remove anything that might make you trip as you walk through a door, such as a raised step or threshold.  Trim any bushes or trees on the path to your home.  Use bright outdoor lighting.  Clear any walking paths of anything that might make someone trip, such as rocks or tools.  Regularly check to see if handrails are loose or broken. Make sure that both sides of any steps have handrails.  Any raised decks and porches should have guardrails on the edges.  Have any leaves, snow, or ice cleared regularly.  Use sand or salt on walking paths during winter.  Clean up any spills in your garage right away. This includes oil or grease spills. What can I do in the bathroom?  Use night lights.  Install grab bars by the toilet and in the tub and shower. Do not use towel bars as grab bars.  Use non-skid mats or decals in the tub or shower.  If you need to sit down in the shower, use a plastic, non-slip stool.  Keep the floor dry. Clean  up any water that spills on the floor as soon as it happens.  Remove soap buildup in the tub or shower regularly.  Attach bath mats securely with double-sided non-slip rug tape.  Do not have throw rugs and other things on the floor that can make you trip. What can I do in the bedroom?  Use night lights.  Make sure that you have a light by your bed that is easy to reach.  Do not use any sheets or blankets that are too big for your bed. They should not hang down onto the floor.  Have a firm chair that has side arms. You can use this for support while you get dressed.  Do not have throw rugs and other things on the floor that can make you trip. What can I do in the kitchen?  Clean up any spills right away.  Avoid walking on wet floors.  Keep items that you use a lot in easy-to-reach places.  If you need to reach something above you, use a strong step stool that has a grab bar.  Keep electrical cords out of the way.  Do not use floor polish or wax that makes floors slippery. If you must use wax, use non-skid floor wax.  Do  not have throw rugs and other things on the floor that can make you trip. What can I do with my stairs?  Do not leave any items on the stairs.  Make sure that there are handrails on both sides of the stairs and use them. Fix handrails that are broken or loose. Make sure that handrails are as long as the stairways.  Check any carpeting to make sure that it is firmly attached to the stairs. Fix any carpet that is loose or worn.  Avoid having throw rugs at the top or bottom of the stairs. If you do have throw rugs, attach them to the floor with carpet tape.  Make sure that you have a light switch at the top of the stairs and the bottom of the stairs. If you do not have them, ask someone to add them for you. What else can I do to help prevent falls?  Wear shoes that:  Do not have high heels.  Have rubber bottoms.  Are comfortable and fit you well.  Are  closed at the toe. Do not wear sandals.  If you use a stepladder:  Make sure that it is fully opened. Do not climb a closed stepladder.  Make sure that both sides of the stepladder are locked into place.  Ask someone to hold it for you, if possible.  Clearly mark and make sure that you can see:  Any grab bars or handrails.  First and last steps.  Where the edge of each step is.  Use tools that help you move around (mobility aids) if they are needed. These include:  Canes.  Walkers.  Scooters.  Crutches.  Turn on the lights when you go into a dark area. Replace any light bulbs as soon as they burn out.  Set up your furniture so you have a clear path. Avoid moving your furniture around.  If any of your floors are uneven, fix them.  If there are any pets around you, be aware of where they are.  Review your medicines with your doctor. Some medicines can make you feel dizzy. This can increase your chance of falling. Ask your doctor what other things that you can do to help prevent falls. This information is not intended to replace advice given to you by your health care provider. Make sure you discuss any questions you have with your health care provider. Document Released: 05/18/2009 Document Revised: 12/28/2015 Document Reviewed: 08/26/2014 Elsevier Interactive Patient Education  2017 [employer].

## 2020-12-09 ENCOUNTER — Other Ambulatory Visit: Payer: Self-pay | Admitting: Internal Medicine

## 2020-12-09 DIAGNOSIS — F5101 Primary insomnia: Secondary | ICD-10-CM

## 2020-12-09 NOTE — Telephone Encounter (Signed)
Requested Prescriptions  Pending Prescriptions Disp Refills  . traZODone (DESYREL) 150 MG tablet [Pharmacy Med Name: TRAZODONE 150 MG TABLET] 90 tablet 0    Sig: TAKE 1 TABLET BY MOUTH AT BEDTIME AS NEEDED FOR SLEEP.     Psychiatry: Antidepressants - Serotonin Modulator Passed - 12/09/2020  9:11 AM      Passed - Valid encounter within last 6 months    Recent Outpatient Visits          5 days ago Ritta Slot, oral   [facility] Glean Hess, MD   2 months ago Hyperlipidemia, mild   [facility] Glean Hess, MD   5 months ago Essential (primary) hypertension   [facility] Glean Hess, MD   7 months ago Cerebrovascular accident (CVA), unspecified mechanism [facility])   [facility] Glean Hess, MD   8 months ago Essential (primary) hypertension   [facility] Glean Hess, MD      Future Appointments            In 2 weeks Army Melia Jesse Sans, MD [facility], [facility]

## 2020-12-11 ENCOUNTER — Other Ambulatory Visit: Payer: Self-pay

## 2020-12-11 ENCOUNTER — Telehealth: Payer: Self-pay | Admitting: Internal Medicine

## 2020-12-11 DIAGNOSIS — F5101 Primary insomnia: Secondary | ICD-10-CM

## 2020-12-11 DIAGNOSIS — E785 Hyperlipidemia, unspecified: Secondary | ICD-10-CM

## 2020-12-11 MED ORDER — ROSUVASTATIN CALCIUM 5 MG PO TABS: 5.0000 mg | ORAL_TABLET | Freq: Every day | ORAL | 3 refills | Status: DC

## 2020-12-11 MED ORDER — TRAZODONE HCL 150 MG PO TABS: 150.0000 mg | ORAL_TABLET | Freq: Every evening | ORAL | 0 refills | Status: DC | PRN

## 2020-12-11 NOTE — Telephone Encounter (Signed)
ERROR

## 2020-12-11 NOTE — Telephone Encounter (Signed)
Copied from [place] [id number]. Topic: Quick Communication - Rx Refill/Question >> Dec 11, 2020 10:10 AM Lionel December wrote: Medication: rosuvastatin (CRESTOR) 5 MG tablet, traZODone (DESYREL) 150 MG tablet  Has the patient contacted their pharmacy? Yes.   YES (Agent: If no, request that the patient contact the pharmacy for the refill.) (Agent: If yes, when and what did the pharmacy advise?)  Preferred Pharmacy (with phone number or street name): [facility], [place]  Phone:  [phone] Fax:  [phone]     Agent: Please be advised that RX refills may take up to 3 business days. We ask that you follow-up with your pharmacy.

## 2020-12-13 ENCOUNTER — Other Ambulatory Visit: Payer: Self-pay | Admitting: Internal Medicine

## 2020-12-13 NOTE — Telephone Encounter (Signed)
Requested Prescriptions  Pending Prescriptions Disp Refills  . levothyroxine (SYNTHROID) 88 MCG tablet [Pharmacy Med Name: LEVOTHYROXINE 88 MCG TABLET] 90 tablet 0    Sig: TAKE 1 TABLET BY MOUTH DAILY BEFORE BREAKFAST MONDAY THROUGH SATURDAY AND TAKE 1/2 TAB ON SUNDAYS     Endocrinology:  Hypothyroid Agents Failed - 12/13/2020  3:09 AM      Failed - TSH needs to be rechecked within 3 months after an abnormal result. Refill until TSH is due.      Failed - TSH in normal range and within 360 days    TSH  Date Value Ref Range Status  07/04/2020 5.460 (H) 0.450 - 4.500 uIU/mL Final         Passed - Valid encounter within last 12 months    Recent Outpatient Visits          1 week ago Ritta Slot, oral   [facility] Glean Hess, MD   2 months ago Hyperlipidemia, mild   [facility] Glean Hess, MD   5 months ago Essential (primary) hypertension   [facility] Glean Hess, MD   7 months ago Cerebrovascular accident (CVA), unspecified mechanism [facility])   [facility] Glean Hess, MD   8 months ago Essential (primary) hypertension   [facility] Glean Hess, MD      Future Appointments            In 1 week Army Melia Jesse Sans, MD [facility], [facility]

## 2020-12-24 NOTE — Progress Notes (Signed)
Date:  12/25/2020   Name:  Crystal Zavala   DOB:  05-02-1932   MRN:  [id number]   Chief Complaint: Annual Exam (Breast exam no pap )  Crystal Zavala is a 87 y.o. female who presents today for her yearly Exam. She feels fairly well. She reports exercising PT x2 times a week. She reports she is sleeping well. Breast complaints none.  Mammogram: 10/2019 @ [facility] DEXA: 2002 patient declines further exams Colonoscopy: aged out  Immunization History  Administered Date(s) Administered  . Fluad Quad(high Dose 65+) 03/31/2019, 05/10/2020  . Influenza, High Dose Seasonal PF 04/22/2017  . Influenza,inj,Quad PF,6+ Mos 04/22/2016  . Influenza-Unspecified 06/05/2018  . PFIZER(Purple Top)SARS-COV-2 Vaccination 08/28/2019, 09/19/2019  . Pneumococcal Conjugate-13 09/13/2014  . Pneumococcal Polysaccharide-23 10/03/2004  . Tdap 08/27/2011    Hypertension This is a chronic problem. The problem is controlled. Associated symptoms include shortness of breath. Pertinent negatives include no chest pain, headaches or palpitations. Past treatments include diuretics and calcium channel blockers. The current treatment provides significant improvement. Hypertensive end-organ damage includes kidney disease and CVA. Identifiable causes of hypertension include a thyroid problem.  Hyperlipidemia This is a chronic problem. The problem is uncontrolled. Associated symptoms include shortness of breath. Pertinent negatives include no chest pain.  Thyroid Problem Patient reports no anxiety, constipation, diarrhea, fatigue, palpitations or tremors. Her past medical history is significant for hyperlipidemia.  Asthma She complains of shortness of breath. There is no cough or wheezing. This is a recurrent problem. The problem occurs intermittently. Pertinent negatives include no chest pain, fever, headaches or trouble swallowing. Her symptoms are alleviated by beta-agonist and steroid inhaler. She reports significant improvement  on treatment. Her past medical history is significant for asthma.  Paroxysmal Afib - rate controlled, on Eliquis for anticoagulation.  No bleeding issues. She believes that she is mostly in SR.  Lab Results  Component Value Date   CREATININE 0.91 02/23/2020   BUN 11 02/23/2020   NA 140 02/23/2020   K 4.1 02/23/2020   CL 103 02/23/2020   CO2 24 02/23/2020   Lab Results  Component Value Date   CHOL 240 (H) 08/26/2019   HDL 78 08/26/2019   LDLCALC 147 (H) 08/26/2019   TRIG 85 08/26/2019   CHOLHDL 3.1 08/26/2019   Lab Results  Component Value Date   TSH 5.460 (H) 07/04/2020   Lab Results  Component Value Date   HGBA1C 5.2 04/22/2017   Lab Results  Component Value Date   WBC 10.8 08/26/2019   HGB 14.0 08/26/2019   HCT 42.2 08/26/2019   MCV 95 08/26/2019   PLT 265 08/26/2019   Lab Results  Component Value Date   ALT 13 08/26/2019   AST 14 08/26/2019   ALKPHOS 104 08/26/2019   BILITOT 0.5 08/26/2019     Review of Systems  Constitutional: Negative for chills, fatigue and fever.  HENT: Negative for congestion, hearing loss, tinnitus, trouble swallowing and voice change.   Eyes: Negative for visual disturbance.  Respiratory: Positive for shortness of breath. Negative for cough, chest tightness and wheezing.   Cardiovascular: Negative for chest pain, palpitations and leg swelling.  Gastrointestinal: Negative for abdominal pain, constipation, diarrhea and vomiting.  Endocrine: Negative for polydipsia and polyuria.  Genitourinary: Negative for dysuria, frequency, genital sores, vaginal bleeding and vaginal discharge.  Musculoskeletal: Negative for arthralgias, gait problem and joint swelling.  Skin: Negative for color change and rash.  Neurological: Negative for dizziness, tremors, light-headedness and headaches.  Hematological: Negative  for adenopathy. Does not bruise/bleed easily.  Psychiatric/Behavioral: Negative for dysphoric mood and sleep disturbance. The patient is  not nervous/anxious.     Patient Active Problem List   Diagnosis Date Noted  . Cerebrovascular accident (CVA) ([place]) 05/10/2020  . Decreased functional activity tolerance 02/01/2020  . Acquired thrombophilia ([place]) 01/18/2020  . Paroxysmal atrial fibrillation ([place]) 02/23/2019  . Allergy to tramadol 09/03/2018  . Pain of both hip joints 04/22/2017  . Renal insufficiency 01/05/2017  . 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 Spondylosis of lumbar region without myelopathy or radiculopathy 10/21/2016  . Hypothyroidism due to acquired atrophy of thyroid 04/22/2016  . Insomnia 04/22/2016  . Breast mass, right 01/10/2016  . Aortic atherosclerosis ([place]) 01/18/2015  . Arthritis of neck 01/18/2015  . Dermatitis, eczematoid 01/18/2015  . Essential (primary) hypertension 01/18/2015  . Esophagitis, reflux 01/18/2015  . H/O adenomatous polyp of colon 01/18/2015  . Migraine without aura and responsive to treatment 01/18/2015  . Asthma, mild intermittent 01/18/2015  . Hyperlipidemia, mild 01/18/2015  . Lung nodule, multiple 01/18/2015  . Kidney cysts 01/18/2015  . Tobacco use disorder, moderate, in sustained remission 01/18/2015  . Temporary cerebral vascular dysfunction 01/18/2015  . Dysphagia 10/26/2013    Allergies  Allergen Reactions  . Amlodipine Swelling  . Tramadol Nausea Only and Rash  . Ace Inhibitors Cough    Past Surgical History:  Procedure Laterality Date  . ABDOMINAL HYSTERECTOMY    . BLEPHAROPLASTY Bilateral 2013  . BREAST BIOPSY Left 2009   benign  . COLONOSCOPY  2006   Normal  . ESI  01/2017   L5-S1 @ [facility]  . ESOPHAGOGASTRODUODENOSCOPY  2015   Dilation of esophageal stricture  . TOTAL HIP ARTHROPLASTY Right 2019    Social History   Tobacco Use  . Smoking status: Former Smoker    Packs/day: 0.50    Years: 39.00    Pack years: 19.50    Types: Cigarettes    Quit date: 1992    Years since quitting: 30.4  . Smokeless tobacco:  Never Used  . Tobacco comment: smoking cessation materails not required  Vaping Use  . Vaping Use: Never used  Substance Use Topics  . Alcohol use: No    Alcohol/week: 0.0 standard drinks  . Drug use: No     Medication list has been reviewed and updated.  Current Meds  Medication Sig  . albuterol (VENTOLIN HFA) 108 (90 Base) MCG/ACT inhaler Inhale into the lungs every 6 (six) hours as needed for wheezing or shortness of breath.  . cetirizine (ZYRTEC) 10 MG tablet Take 10 mg by mouth daily.  . diazepam (VALIUM) 2 MG tablet Take 2 mg by mouth every 6 (six) hours as needed for anxiety.  Marland Kitchen diltiazem (TIAZAC) 120 MG 24 hr capsule Take 120 mg by mouth daily.   Marland Kitchen ELIQUIS 5 MG TABS tablet Take 5 mg by mouth 2 (two) times daily.  . Fluticasone-Salmeterol (ADVAIR DISKUS) 250-50 MCG/DOSE AEPB Inhale 1 puff into the lungs 2 (two) times daily.  Marland Kitchen HYDROcodone-acetaminophen (NORCO/VICODIN) 5-325 MG tablet Take 1 tablet by mouth every 6 (six) hours as needed for moderate pain.  Marland Kitchen levothyroxine (SYNTHROID) 88 MCG tablet TAKE 1 TABLET BY MOUTH DAILY BEFORE BREAKFAST MONDAY THROUGH SATURDAY AND TAKE 1/2 TAB ON SUNDAYS  . magnesium chloride (SLOW-MAG) 64 MG TBEC SR tablet Take by mouth.  . oxybutynin (DITROPAN XL) 5 MG 24 hr tablet Take 1 tablet (5 mg total) by mouth at bedtime.  Marland Kitchen  pantoprazole (PROTONIX) 40 MG tablet Take 1 tablet (40 mg total) by mouth daily.  . rosuvastatin (CRESTOR) 5 MG tablet Take 1 tablet (5 mg total) by mouth daily.  Marland Kitchen spironolactone (ALDACTONE) 25 MG tablet TAKE ONE TABLET BY MOUTH DAILY  . traZODone (DESYREL) 150 MG tablet Take 1 tablet (150 mg total) by mouth at bedtime as needed. for sleep  . Vitamin D, Cholecalciferol, 50 MCG (2000 UT) CAPS Take 2 capsules by mouth daily.    PHQ 2/9 Scores 12/25/2020 12/04/2020 09/15/2020 07/04/2020  PHQ - 2 Score 0 0 0 0  PHQ- 9 Score 2 - 0 0    GAD 7 : Generalized Anxiety Score 12/25/2020 09/15/2020 07/04/2020 05/10/2020  Nervous, Anxious,  on Edge 0 0 0 0  Control/stop worrying 0 0 0 0  Worry too much - different things 1 0 0 0  Trouble relaxing 0 0 0 0  Restless 0 0 0 0  Easily annoyed or irritable 0 0 0 0  Afraid - awful might happen 0 0 0 0  Total GAD 7 Score 1 0 0 0  Anxiety Difficulty - Not difficult at all Not difficult at all Not difficult at all    BP Readings from Last 3 Encounters:  12/25/20 (!) 142/80  12/04/20 120/80  12/04/20 120/80    Physical Exam Vitals and nursing note reviewed.  Constitutional:      General: She is not in acute distress.    Appearance: She is well-developed.  HENT:     Head: Normocephalic and atraumatic.     Right Ear: Tympanic membrane and ear canal normal.     Left Ear: Tympanic membrane and ear canal normal.     Nose:     Right Sinus: No maxillary sinus tenderness.     Left Sinus: No maxillary sinus tenderness.  Eyes:     General: No scleral icterus.       Right eye: No discharge.        Left eye: No discharge.     Conjunctiva/sclera: Conjunctivae normal.  Neck:     Thyroid: No thyromegaly.     Vascular: No carotid bruit.  Cardiovascular:     Rate and Rhythm: Normal rate and regular rhythm.     Pulses: Normal pulses.     Heart sounds: Normal heart sounds.  Pulmonary:     Effort: Pulmonary effort is normal. No respiratory distress.     Breath sounds: No wheezing.  Chest:  Breasts:     Right: No mass, nipple discharge, skin change or tenderness.     Left: No mass, nipple discharge, skin change or tenderness.    Abdominal:     General: Bowel sounds are normal.     Palpations: Abdomen is soft.     Tenderness: There is no abdominal tenderness.  Musculoskeletal:     Cervical back: Normal range of motion. No erythema.     Right lower leg: No edema.     Left lower leg: No edema.  Lymphadenopathy:     Cervical: No cervical adenopathy.  Skin:    General: Skin is warm and dry.     Findings: No rash.  Neurological:     Mental Status: She is alert and oriented to  person, place, and time.     Cranial Nerves: No cranial nerve deficit.     Sensory: No sensory deficit.     Deep Tendon Reflexes: Reflexes are normal and symmetric.  Psychiatric:  Attention and Perception: Attention normal.        Mood and Affect: Mood normal.     Wt Readings from Last 3 Encounters:  12/25/20 149 lb (67.6 kg)  12/04/20 149 lb 9.6 oz (67.9 kg)  12/04/20 149 lb 9.6 oz (67.9 kg)    BP (!) 142/80   Pulse 81   Temp 98.1 F (36.7 C) (Oral)   Ht 5\' 2"  (1.575 m)   Wt 149 lb (67.6 kg)   SpO2 98%   BMI 27.25 kg/m   Assessment and Plan: 1. Essential (primary) hypertension Clinically stable exam with well controlled BP. Tolerating medications without side effects at this time. Pt to continue current regimen and low sodium diet; benefits of regular exercise as able discussed. - CBC with Differential/Platelet - POCT urinalysis dipstick - diltiazem (TIAZAC) 120 MG 24 hr capsule; Take 1 capsule (120 mg total) by mouth daily.  Dispense: 90 capsule; Refill: 1  2. Hypothyroidism due to acquired atrophy of thyroid supplemented - TSH + free T4  3. Aortic atherosclerosis ([place]) On statin therapy  4. Paroxysmal atrial fibrillation ([facility]) In SR today on exam Continue current medications, Eliquis  5. Renal insufficiency Limit use of NSAIDS - Comprehensive metabolic panel  6. Hyperlipidemia, mild Tolerating statin medication without side effects at this time on Crestor 5 mg. Continue same therapy without change at this time. - Lipid panel  7. Encounter for screening mammogram for breast cancer To schedule at [facility]   Partially dictated using Dragon software. Any errors are unintentional.  Halina Maidens, MD [facility]  12/25/2020

## 2020-12-25 ENCOUNTER — Other Ambulatory Visit: Payer: Self-pay

## 2020-12-25 ENCOUNTER — Encounter: Payer: Self-pay | Admitting: Internal Medicine

## 2020-12-25 ENCOUNTER — Ambulatory Visit (INDEPENDENT_AMBULATORY_CARE_PROVIDER_SITE_OTHER): Payer: Medicare Other | Admitting: Internal Medicine

## 2020-12-25 VITALS — BP 142/80 | HR 81 | Temp 98.1°F | Ht 62.0 in | Wt 149.0 lb

## 2020-12-25 DIAGNOSIS — E034 Atrophy of thyroid (acquired): Secondary | ICD-10-CM

## 2020-12-25 DIAGNOSIS — I700 Atherosclerosis of aorta: Secondary | ICD-10-CM

## 2020-12-25 DIAGNOSIS — I639 Cerebral infarction, unspecified: Secondary | ICD-10-CM | POA: Diagnosis not present

## 2020-12-25 DIAGNOSIS — I480 Paroxysmal atrial fibrillation: Secondary | ICD-10-CM

## 2020-12-25 DIAGNOSIS — Z1231 Encounter for screening mammogram for malignant neoplasm of breast: Secondary | ICD-10-CM

## 2020-12-25 DIAGNOSIS — I10 Essential (primary) hypertension: Secondary | ICD-10-CM | POA: Diagnosis not present

## 2020-12-25 DIAGNOSIS — E785 Hyperlipidemia, unspecified: Secondary | ICD-10-CM

## 2020-12-25 DIAGNOSIS — N289 Disorder of kidney and ureter, unspecified: Secondary | ICD-10-CM

## 2020-12-25 LAB — POCT URINALYSIS DIPSTICK
Bilirubin, UA: NEGATIVE
Blood, UA: NEGATIVE
Glucose, UA: NEGATIVE
Ketones, UA: NEGATIVE
Leukocytes, UA: NEGATIVE
Nitrite, UA: NEGATIVE
Protein, UA: NEGATIVE
Spec Grav, UA: 1.02 (ref 1.010–1.025)
Urobilinogen, UA: 0.2 E.U./dL
pH, UA: 6 (ref 5.0–8.0)

## 2020-12-25 MED ORDER — DILTIAZEM HCL ER BEADS 120 MG PO CP24: 120.0000 mg | ORAL_CAPSULE | Freq: Every day | ORAL | 1 refills | Status: DC

## 2020-12-26 LAB — COMPREHENSIVE METABOLIC PANEL
ALT: 9 IU/L (ref 0–32)
AST: 12 IU/L (ref 0–40)
Albumin/Globulin Ratio: 1.9 (ref 1.2–2.2)
Albumin: 4.1 g/dL (ref 3.6–4.6)
Alkaline Phosphatase: 109 IU/L (ref 44–121)
BUN/Creatinine Ratio: 18 (ref 12–28)
BUN: 19 mg/dL (ref 8–27)
Bilirubin Total: 0.5 mg/dL (ref 0.0–1.2)
CO2: 23 mmol/L (ref 20–29)
Calcium: 10.5 mg/dL — ABNORMAL HIGH (ref 8.7–10.3)
Chloride: 104 mmol/L (ref 96–106)
Creatinine, Ser: 1.05 mg/dL — ABNORMAL HIGH (ref 0.57–1.00)
Globulin, Total: 2.2 g/dL (ref 1.5–4.5)
Glucose: 101 mg/dL — ABNORMAL HIGH (ref 65–99)
Potassium: 4.6 mmol/L (ref 3.5–5.2)
Sodium: 142 mmol/L (ref 134–144)
Total Protein: 6.3 g/dL (ref 6.0–8.5)
eGFR: 51 mL/min/{1.73_m2} — ABNORMAL LOW (ref >59)

## 2020-12-26 LAB — LIPID PANEL
Chol/HDL Ratio: 2.1 ratio (ref 0.0–4.4)
Cholesterol, Total: 168 mg/dL (ref 100–199)
HDL: 81 mg/dL (ref >39)
LDL Chol Calc (NIH): 72 mg/dL (ref 0–99)
Triglycerides: 79 mg/dL (ref 0–149)
VLDL Cholesterol Cal: 15 mg/dL (ref 5–40)

## 2020-12-26 LAB — CBC WITH DIFFERENTIAL/PLATELET
Basophils Absolute: 0.1 10*3/uL (ref 0.0–0.2)
Basos: 1 %
EOS (ABSOLUTE): 0.1 10*3/uL (ref 0.0–0.4)
Eos: 1 %
Hematocrit: 38.3 % (ref 34.0–46.6)
Hemoglobin: 12.1 g/dL (ref 11.1–15.9)
Immature Grans (Abs): 0.1 10*3/uL (ref 0.0–0.1)
Immature Granulocytes: 1 %
Lymphocytes Absolute: 1.2 10*3/uL (ref 0.7–3.1)
Lymphs: 10 %
MCH: 28.5 pg (ref 26.6–33.0)
MCHC: 31.6 g/dL (ref 31.5–35.7)
MCV: 90 fL (ref 79–97)
Monocytes Absolute: 0.9 10*3/uL (ref 0.1–0.9)
Monocytes: 8 %
Neutrophils Absolute: 9.3 10*3/uL — ABNORMAL HIGH (ref 1.4–7.0)
Neutrophils: 79 %
Platelets: 366 10*3/uL (ref 150–450)
RBC: 4.24 x10E6/uL (ref 3.77–5.28)
RDW: 14.2 % (ref 11.7–15.4)
WBC: 11.6 10*3/uL — ABNORMAL HIGH (ref 3.4–10.8)

## 2020-12-26 LAB — TSH+FREE T4
Free T4: 1.95 ng/dL — ABNORMAL HIGH (ref 0.82–1.77)
TSH: 4.8 u[IU]/mL — ABNORMAL HIGH (ref 0.450–4.500)

## 2020-12-27 ENCOUNTER — Telehealth: Payer: Self-pay

## 2020-12-27 NOTE — Telephone Encounter (Signed)
Called pt with labs. While on the phone pt stated that she was having a side pain and had a low grade fever of 99.7. Pt stated she felt like she needed a antibiotic told pt we would need to see her in office to able to see if she needed a antibiotic. Offered pt an appt. Pt declined and stated she will see if she gets better and then call us for an appt.   KP

## 2021-02-21 ENCOUNTER — Other Ambulatory Visit: Payer: Self-pay | Admitting: Internal Medicine

## 2021-02-21 DIAGNOSIS — I10 Essential (primary) hypertension: Secondary | ICD-10-CM

## 2021-02-21 NOTE — Telephone Encounter (Signed)
Requested Prescriptions  Pending Prescriptions Disp Refills  . spironolactone (ALDACTONE) 25 MG tablet [Pharmacy Med Name: SPIRONOLACTONE 25 MG TABLET] 90 tablet 0    Sig: TAKE ONE TABLET BY MOUTH DAILY     Cardiovascular: Diuretics - Aldosterone Antagonist Failed - 02/21/2021  6:07 AM      Failed - Cr in normal range and within 360 days    Creatinine, Ser  Date Value Ref Range Status  12/25/2020 1.05 (H) 0.57 - 1.00 mg/dL Final         Failed - Last BP in normal range    BP Readings from Last 1 Encounters:  12/25/20 (!) 142/80         Passed - K in normal range and within 360 days    Potassium  Date Value Ref Range Status  12/25/2020 4.6 3.5 - 5.2 mmol/L Final         Passed - Na in normal range and within 360 days    Sodium  Date Value Ref Range Status  12/25/2020 142 134 - 144 mmol/L Final         Passed - Valid encounter within last 6 months    Recent Outpatient Visits          1 month ago Essential (primary) hypertension   [facility] Glean Hess, MD   2 months ago Ritta Slot, oral   [facility] Glean Hess, MD   5 months ago Hyperlipidemia, mild   [facility] Glean Hess, MD   7 months ago Essential (primary) hypertension   [facility] Glean Hess, MD   9 months ago Cerebrovascular accident (CVA), unspecified mechanism [facility])   [place], Laura H, MD      Future Appointments            In 4 months Army Melia Jesse Sans, MD [facility], [place]   In 10 months Army Melia Jesse Sans, MD [facility]

## 2021-02-22 ENCOUNTER — Ambulatory Visit: Payer: Self-pay | Admitting: *Deleted

## 2021-02-22 NOTE — Telephone Encounter (Signed)
Reason for Disposition  [1] MODERATE diarrhea (e.g., 4-6 times / day more than normal) AND [2] age > 70 years  Answer Assessment - Initial Assessment Questions 1. DIARRHEA SEVERITY: "How bad is the diarrhea?" "How many more stools have you had in the past 24 hours than normal?"    - NO DIARRHEA (SCALE 0)   - MILD (SCALE 1-3): Few loose or mushy BMs; increase of 1-3 stools over normal daily number of stools; mild increase in ostomy output.   -  MODERATE (SCALE 4-7): Increase of 4-6 stools daily over normal; moderate increase in ostomy output. * SEVERE (SCALE 8-10; OR 'WORST POSSIBLE'): Increase of 7 or more stools daily over normal; moderate increase in ostomy output; incontinence.     6-7 in 24hrs 2. ONSET: "When did the diarrhea begin?"      Tuesday 3. BM CONSISTENCY: "How loose or watery is the diarrhea?"      watery 4. VOMITING: "Are you also vomiting?" If Yes, ask: "How many times in the past 24 hours?"      Nausea, no vomiting 5. ABDOMINAL PAIN: "Are you having any abdominal pain?" If Yes, ask: "What does it feel like?" (e.g., crampy, dull, intermittent, constant)      Lower abdomen cramping, constant 6. ABDOMINAL PAIN SEVERITY: If present, ask: "How bad is the pain?"  (e.g., Scale 1-10; mild, moderate, or severe)   - MILD (1-3): doesn't interfere with normal activities, abdomen soft and not tender to touch    - MODERATE (4-7): interferes with normal activities or awakens from sleep, abdomen tender to touch    - SEVERE (8-10): excruciating pain, doubled over, unable to do any normal activities       5-6/10 7. ORAL INTAKE: If vomiting, "Have you been able to drink liquids?" "How much liquids have you had in the past 24 hours?"     "Drinking well" 8. HYDRATION: "Any signs of dehydration?" (e.g., dry mouth [not just dry lips], too weak to stand, dizziness, new weight loss) "When did you last urinate?"     "Drinking lots of water" 9. EXPOSURE: "Have you traveled to a foreign country  recently?" "Have you been exposed to anyone with diarrhea?" "Could you have eaten any food that was spoiled?"     no 10. ANTIBIOTIC USE: "Are you taking antibiotics now or have you taken antibiotics in the past 2 months?"       no 11. OTHER SYMPTOMS: "Do you have any other symptoms?" (e.g., fever, blood in stool)       No, stool is light brownish.  Protocols used: [facility]

## 2021-02-22 NOTE — Telephone Encounter (Signed)
Noted pt has an appt tomorrow.  KP

## 2021-02-22 NOTE — Telephone Encounter (Signed)
Pt reports diarrhea, onset Tuesday. Reports 5-6 times daily, "But getting better, not as much stool." States stool "Mostly watery." Has not been on antibiotics, denies fever. Reports nausea, no vomiting. States is staying hydrated, "Drinking a lot of water" urinating as usual, urine is not dark in color. States is able to eat , "Just not as much." Reports abdominal cramping. Stools are light brownish. Also reports reoccurrence of Thrush. Reports white coating, and redness at middle of tongue. Had episode 12/04/20, ordered Nystatin. Called practice, Crystal Zavala, appt scheduled for tomorrow with Dr. Army Melia. Pt advised ED for any worsening symptoms; reviewed S/S dehydration, care advise given per protocol. Pt verbalizes understanding.

## 2021-02-23 ENCOUNTER — Other Ambulatory Visit: Payer: Self-pay

## 2021-02-23 ENCOUNTER — Encounter: Payer: Self-pay | Admitting: Internal Medicine

## 2021-02-23 ENCOUNTER — Ambulatory Visit (INDEPENDENT_AMBULATORY_CARE_PROVIDER_SITE_OTHER): Payer: Medicare Other | Admitting: Internal Medicine

## 2021-02-23 VITALS — BP 112/80 | HR 77 | Temp 98.3°F | Ht 62.0 in | Wt 147.0 lb

## 2021-02-23 DIAGNOSIS — K5901 Slow transit constipation: Secondary | ICD-10-CM

## 2021-02-23 DIAGNOSIS — I639 Cerebral infarction, unspecified: Secondary | ICD-10-CM | POA: Diagnosis not present

## 2021-02-23 DIAGNOSIS — B370 Candidal stomatitis: Secondary | ICD-10-CM | POA: Diagnosis not present

## 2021-02-23 MED ORDER — NYSTATIN 100000 UNIT/ML MT SUSP: 5.0000 mL | Freq: Four times a day (QID) | OROMUCOSAL | 0 refills | Status: DC

## 2021-02-23 NOTE — Progress Notes (Signed)
Date:  02/23/2021   Name:  Crystal Zavala   DOB:  April 08, 1932   MRN:  [id number]   Chief Complaint: Nausea (X 1 month, no vomiting ), Diarrhea (X1 months Comes and goes pt states she is constipated at this time. Feel sick on stomach, has cramps in abdomen, 5-6 loose stools a day and has to get up at night  ), and Thrush (X3 weeks, used some mouth was from a while ago but it did not help, used warm salt water which burns mouth)  Diarrhea  This is a recurrent problem. The problem occurs 2 to 4 times per day. The problem has been waxing and waning. Diarrhea characteristics: not bloody or mucus. The patient states that diarrhea does not awaken her from sleep. Associated symptoms include increased flatus. Pertinent negatives include no abdominal pain, chills, coughing, fever, headaches or sweats. Exacerbated by: taking exlax for constipation. She has tried nothing for the symptoms. she previously took Miralax daily but got out of the habit  Oral Pain  This is a recurrent problem. The problem occurs constantly. The problem has been gradually improving. The pain is moderate. Associated symptoms include thermal sensitivity. Pertinent negatives include no difficulty swallowing, fever, oral bleeding or sinus pressure. Treatments tried: saline rinses.   Lab Results  Component Value Date   CREATININE 1.05 (H) 12/25/2020   BUN 19 12/25/2020   NA 142 12/25/2020   K 4.6 12/25/2020   CL 104 12/25/2020   CO2 23 12/25/2020   Lab Results  Component Value Date   CHOL 168 12/25/2020   HDL 81 12/25/2020   LDLCALC 72 12/25/2020   TRIG 79 12/25/2020   CHOLHDL 2.1 12/25/2020   Lab Results  Component Value Date   TSH 4.800 (H) 12/25/2020   Lab Results  Component Value Date   HGBA1C 5.2 04/22/2017   Lab Results  Component Value Date   WBC 11.6 (H) 12/25/2020   HGB 12.1 12/25/2020   HCT 38.3 12/25/2020   MCV 90 12/25/2020   PLT 366 12/25/2020   Lab Results  Component Value Date   ALT 9 12/25/2020    AST 12 12/25/2020   ALKPHOS 109 12/25/2020   BILITOT 0.5 12/25/2020     Review of Systems  Constitutional:  Negative for chills and fever.  HENT:  Negative for sinus pressure.   Respiratory:  Negative for cough, chest tightness and wheezing.   Cardiovascular:  Negative for chest pain and leg swelling.  Gastrointestinal:  Positive for constipation, diarrhea and flatus. Negative for abdominal pain.  Musculoskeletal:  Positive for back pain and gait problem.  Neurological:  Negative for dizziness, light-headedness and headaches.  Hematological:  Negative for adenopathy. Does not bruise/bleed easily.  Psychiatric/Behavioral:  Negative for dysphoric mood and sleep disturbance. The patient is not nervous/anxious.    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   Spondylosis of lumbar region without myelopathy or radiculopathy 10/21/2016   Hypothyroidism due to acquired atrophy of thyroid 04/22/2016   Insomnia 04/22/2016   Breast mass, right 01/10/2016   Aortic atherosclerosis ([place]) 01/18/2015   Arthritis of neck 01/18/2015   Dermatitis, eczematoid 01/18/2015  Essential (primary) hypertension 01/18/2015   Esophagitis, reflux 01/18/2015   H/O adenomatous polyp of colon 01/18/2015   Migraine without aura and responsive to treatment 01/18/2015   Asthma, mild intermittent 01/18/2015   Hyperlipidemia, mild 01/18/2015   Lung nodule, multiple 01/18/2015   Kidney cysts 01/18/2015   Tobacco use disorder, moderate, in sustained remission 01/18/2015   Temporary cerebral vascular dysfunction 01/18/2015   Dysphagia  10/26/2013    Allergies  Allergen Reactions   Amlodipine Swelling   Tramadol Nausea Only and Rash   Ace Inhibitors Cough    Past Surgical History:  Procedure Laterality Date   ABDOMINAL HYSTERECTOMY     BLEPHAROPLASTY Bilateral 2013   BREAST BIOPSY Left 2009   benign   COLONOSCOPY  2006   Normal   ESI  01/2017   L5-S1 @ [facility]   ESOPHAGOGASTRODUODENOSCOPY  2015   Dilation of esophageal stricture   TOTAL HIP ARTHROPLASTY Right 2019    Social History   Tobacco Use   Smoking status: Former    Packs/day: 0.50    Years: 39.00    Pack years: 19.50    Types: Cigarettes    Quit date: 1992    Years since quitting: 30.5   Smokeless tobacco: Never   Tobacco comments:    smoking cessation materails not required  Vaping Use   Vaping Use: Never used  Substance Use Topics   Alcohol use: No    Alcohol/week: 0.0 standard drinks   Drug use: No     Medication list has been reviewed and updated.  Current Meds  Medication Sig   albuterol (VENTOLIN HFA) 108 (90 Base) MCG/ACT inhaler Inhale into the lungs every 6 (six) hours as needed for wheezing or shortness of breath.   cetirizine (ZYRTEC) 10 MG tablet Take 10 mg by mouth daily.   diltiazem (TIAZAC) 120 MG 24 hr capsule Take 1 capsule (120 mg total) by mouth daily.   ELIQUIS 5 MG TABS tablet Take 5 mg by mouth 2 (two) times daily.   Fluticasone-Salmeterol (ADVAIR DISKUS) 250-50 MCG/DOSE AEPB Inhale 1 puff into the lungs 2 (two) times daily.   HYDROcodone-acetaminophen (NORCO/VICODIN) 5-325 MG tablet Take 1 tablet by mouth every 6 (six) hours as needed for moderate pain.   levothyroxine (SYNTHROID) 88 MCG tablet TAKE 1 TABLET BY MOUTH DAILY BEFORE BREAKFAST MONDAY THROUGH SATURDAY AND TAKE 1/2 TAB ON SUNDAYS (Patient taking differently: daily.)   magnesium oxide (MAG-OX) 400 MG tablet Take by mouth.   oxybutynin (DITROPAN XL) 5 MG 24 hr tablet Take 1 tablet (5 mg total) by mouth at bedtime.   pantoprazole (PROTONIX) 40 MG tablet  Take 1 tablet (40 mg total) by mouth daily.   rosuvastatin (CRESTOR) 5 MG tablet Take 1 tablet (5 mg total) by mouth daily.   spironolactone (ALDACTONE) 25 MG tablet TAKE ONE TABLET BY MOUTH DAILY   traZODone (DESYREL) 150 MG tablet Take 1 tablet (150 mg total) by mouth at bedtime as needed. for sleep   Vitamin D, Cholecalciferol, 50 MCG (2000 UT) CAPS Take 2 capsules by mouth daily.   [DISCONTINUED] diazepam (VALIUM) 2 MG tablet Take 2 mg by mouth every 6 (six) hours as needed for anxiety.    PHQ 2/9 Scores 12/25/2020 12/04/2020 09/15/2020 07/04/2020  PHQ - 2 Score 0 0 0 0  PHQ- 9 Score 2 - 0 0    GAD 7 : Generalized Anxiety Score 12/25/2020 09/15/2020 07/04/2020 05/10/2020  Nervous, Anxious, on Edge 0 0 0 0  Control/stop worrying 0  0 0 0  Worry too much - different things 1 0 0 0  Trouble relaxing 0 0 0 0  Restless 0 0 0 0  Easily annoyed or irritable 0 0 0 0  Afraid - awful might happen 0 0 0 0  Total GAD 7 Score 1 0 0 0  Anxiety Difficulty - Not difficult at all Not difficult at all Not difficult at all    BP Readings from Last 3 Encounters:  02/23/21 112/80  12/25/20 (!) 142/80  12/04/20 120/80    Physical Exam Vitals and nursing note reviewed.  Constitutional:      General: She is not in acute distress.    Appearance: She is well-developed.  HENT:     Head: Normocephalic and atraumatic.     Mouth/Throat:     Mouth: Mucous membranes are moist. No oral lesions.     Tongue: No lesions (but reddened).  Cardiovascular:     Rate and Rhythm: Normal rate and regular rhythm.     Pulses: Normal pulses.     Heart sounds: No murmur heard. Pulmonary:     Effort: Pulmonary effort is normal. No respiratory distress.     Breath sounds: No wheezing or rhonchi.  Abdominal:     Palpations: Abdomen is soft. There is no mass.     Tenderness: There is no abdominal tenderness. There is no guarding or rebound.  Musculoskeletal:     Cervical back: Normal range of motion.     Right lower  leg: No edema.     Left lower leg: No edema.  Lymphadenopathy:     Cervical: No cervical adenopathy.  Skin:    General: Skin is warm and dry.     Findings: No rash.  Neurological:     General: No focal deficit present.     Mental Status: She is alert and oriented to person, place, and time.  Psychiatric:        Mood and Affect: Mood normal.        Behavior: Behavior normal.    Wt Readings from Last 3 Encounters:  02/23/21 147 lb (66.7 kg)  12/25/20 149 lb (67.6 kg)  12/04/20 149 lb 9.6 oz (67.9 kg)    BP 112/80   Pulse 77   Temp 98.3 F (36.8 C) (Oral)   Ht '5\' 2"'$  (1.575 m)   Wt 147 lb (66.7 kg)   SpO2 98%   BMI 26.89 kg/m   Assessment and Plan: 1. Thrush, oral Use nystatin s/g/s qid Remove partials for treatmemt - nystatin (MYCOSTATIN) 100000 UNIT/ML suspension; Take 5 mLs (500,000 Units total) by mouth 4 (four) times daily for 7 days. Swish, gargle and spit  Dispense: 473 mL; Refill: 0  2. Slow transit constipation Stop using Exlax Resume daily Miralax - titrate to effect   Partially dictated using Editor, commissioning. Any errors are unintentional.  Halina Maidens, MD [facility]  02/23/2021

## 2021-02-23 NOTE — Patient Instructions (Signed)
Get a probiotic with multiple strains in it.  Take for a month to replenish your gut bacteria.  STop using ExLax  Take Miralax every day.

## 2021-03-12 ENCOUNTER — Other Ambulatory Visit: Payer: Self-pay

## 2021-03-12 DIAGNOSIS — F5101 Primary insomnia: Secondary | ICD-10-CM

## 2021-03-12 MED ORDER — TRAZODONE HCL 150 MG PO TABS: 150.0000 mg | ORAL_TABLET | Freq: Every evening | ORAL | 0 refills | Status: DC | PRN

## 2021-04-23 LAB — COMPREHENSIVE METABOLIC PANEL
Calcium: 10.2 (ref 8.7–10.7)
GFR calc non Af Amer: 55

## 2021-04-23 LAB — CBC AND DIFFERENTIAL
HCT: 34 — AB (ref 36–46)
Hemoglobin: 10.4 — AB (ref 12.0–16.0)
Platelets: 377 (ref 150–399)
WBC: 7.9

## 2021-04-23 LAB — BASIC METABOLIC PANEL
BUN: 22 — AB (ref 4–21)
CO2: 27 — AB (ref 13–22)
Chloride: 105 (ref 99–108)
Creatinine: 1 (ref 0.5–1.1)
Glucose: 107
Potassium: 4.6 (ref 3.4–5.3)
Sodium: 137 (ref 137–147)

## 2021-04-23 LAB — TSH: TSH: 0.55 (ref 0.41–5.90)

## 2021-04-25 ENCOUNTER — Other Ambulatory Visit: Payer: Self-pay | Admitting: Internal Medicine

## 2021-04-25 ENCOUNTER — Ambulatory Visit: Payer: Self-pay

## 2021-04-25 ENCOUNTER — Telehealth: Payer: Medicare Other | Admitting: Internal Medicine

## 2021-04-25 DIAGNOSIS — K2100 Gastro-esophageal reflux disease with esophagitis, without bleeding: Secondary | ICD-10-CM

## 2021-04-25 NOTE — Telephone Encounter (Signed)
Patient called and says she was seen by her cardiologist and was told to schedule with her PCP due to swelling of both feet and ankles. She says the cardiologist prescribed Lasix, but it's not helping with the swelling. She says she can barely see the outside bone of the ankles. She says there is no pain, but discomfort at a 4/10. No other symptoms mentioned. Appointment scheduled for today at 1440 with Dr. Army Melia, care advice given, patient verbalized understanding.   Reason for Disposition  [1] MODERATE pain (e.g., interferes with normal activities, limping) AND [2] present > 3 days  Answer Assessment - Initial Assessment Questions 1. LOCATION: "Which ankle is swollen?" "Where is the swelling?"     Both ankles and feet, right > left 2. ONSET: "When did the swelling start?"     Couple of weeks 3. SIZE: "How large is the swelling?"     Can barely see the ankle bone on the outside, able to wear only 1 pair shoes 4. PAIN: "Is there any pain?" If Yes, ask: "How bad is it?" (Scale 1-10; or mild, moderate, severe)   - NONE (0): no pain.   - MILD (1-3): doesn't interfere with normal activities.    - MODERATE (4-7): interferes with normal activities (e.g., work or school) or awakens from sleep, limping.    - SEVERE (8-10): excruciating pain, unable to do any normal activities, unable to walk.      Discomfort 4 5. CAUSE: "What do you think caused the ankle swelling?"     No idea 6. OTHER SYMPTOMS: "Do you have any other symptoms?" (e.g., fever, chest pain, difficulty breathing, calf pain)     No 7. PREGNANCY: "Is there any chance you are pregnant?" "When was your last menstrual period?"     No  Protocols used: Ankle Swelling-A-AH

## 2021-04-27 ENCOUNTER — Other Ambulatory Visit: Payer: Self-pay

## 2021-05-20 ENCOUNTER — Other Ambulatory Visit: Payer: Self-pay | Admitting: Internal Medicine

## 2021-05-20 DIAGNOSIS — I10 Essential (primary) hypertension: Secondary | ICD-10-CM

## 2021-05-20 NOTE — Telephone Encounter (Signed)
Requested Prescriptions  Pending Prescriptions Disp Refills  . spironolactone (ALDACTONE) 25 MG tablet [Pharmacy Med Name: SPIRONOLACTONE 25 MG TABLET] 90 tablet 0    Sig: TAKE ONE TABLET BY MOUTH DAILY     Cardiovascular: Diuretics - Aldosterone Antagonist Passed - 05/20/2021  6:37 AM      Passed - Cr in normal range and within 360 days    Creatinine  Date Value Ref Range Status  04/23/2021 1.0 0.5 - 1.1 Final   Creatinine, Ser  Date Value Ref Range Status  12/25/2020 1.05 (H) 0.57 - 1.00 mg/dL Final         Passed - K in normal range and within 360 days    Potassium  Date Value Ref Range Status  04/23/2021 4.6 3.4 - 5.3 Final         Passed - Na in normal range and within 360 days    Sodium  Date Value Ref Range Status  04/23/2021 137 137 - 147 Final         Passed - Last BP in normal range    BP Readings from Last 1 Encounters:  02/23/21 112/80         Passed - Valid encounter within last 6 months    Recent Outpatient Visits          2 months ago Slow transit constipation   [facility] Glean Hess, MD   4 months ago Essential (primary) hypertension   [facility] Glean Hess, MD   5 months ago Ritta Slot, oral   [facility] Glean Hess, MD   8 months ago Hyperlipidemia, mild   [facility] Glean Hess, MD   10 months ago Essential (primary) hypertension   [facility] Glean Hess, MD      Future Appointments            In 1 month Army Melia Jesse Sans, MD [facility], [place]   In 7 months Army Melia, Jesse Sans, MD [facility]

## 2021-05-21 ENCOUNTER — Ambulatory Visit: Payer: Self-pay

## 2021-05-21 NOTE — Telephone Encounter (Signed)
Pt c/o bilateral leg and feet swelling. Edema begins at both feet to the knees. down. Pt stated there is redness to the top of both feet. Pt c/o edema painful to touch and rates as"severe." Pt has SOB with exertion which she uses her inhalers and breathing improves. No SOB at rest.  Pt has h/o feet and ankle swelling. Appt made for tomorrow am PCP.     Reason for Disposition  [1] MODERATE leg swelling (e.g., swelling extends up to knees) AND [2] new-onset or worsening  Answer Assessment - Initial Assessment Questions 1. ONSET: "When did the swelling start?" (e.g., minutes, hours, days)     2-3 months 2. LOCATION: "What part of the leg is swollen?"  "Are both legs swollen or just one leg?"     Both legs- knees down  3. SEVERITY: "How bad is the swelling?" (e.g., localized; mild, moderate, severe)  - Localized - small area of swelling localized to one leg  - MILD pedal edema - swelling limited to foot and ankle, pitting edema < 1/4 inch (6 mm) deep, rest and elevation eliminate most or all swelling  - MODERATE edema - swelling of lower leg to knee, pitting edema > 1/4 inch (6 mm) deep, rest and elevation only partially reduce swelling  - SEVERE edema - swelling extends above knee, facial or hand swelling present      moderate 4. REDNESS: "Does the swelling look red or infected?"     Yes - top of feet- tight 5. PAIN: "Is the swelling painful to touch?" If Yes, ask: "How painful is it?"   (Scale 1-10; mild, moderate or severe)     No- feet are "tender" rated severe 6. FEVER: "Do you have a fever?" If Yes, ask: "What is it, how was it measured, and when did it start?"      no 7. CAUSE: "What do you think is causing the leg swelling?"     Pt does not know 8. MEDICAL HISTORY: "Do you have a history of heart failure, kidney disease, liver failure, or cancer?"     no 9. RECURRENT SYMPTOM: "Have you had leg swelling before?" If Yes, ask: "When was the last time?" "What happened that time?"      Feet and ankle- seen by PCP 10. OTHER SYMPTOMS: "Do you have any other symptoms?" (e.g., chest pain, difficulty breathing)       No- difficulty breathing at times h/o asthma using inhalers that are helping- SOB with activity 11. PREGNANCY: "Is there any chance you are pregnant?" "When was your last menstrual period?"       N/a  Protocols used: Leg Swelling and Edema-A-AH

## 2021-05-22 ENCOUNTER — Other Ambulatory Visit: Payer: Self-pay | Admitting: Internal Medicine

## 2021-05-22 ENCOUNTER — Other Ambulatory Visit: Payer: Self-pay

## 2021-05-22 ENCOUNTER — Encounter: Payer: Self-pay | Admitting: Internal Medicine

## 2021-05-22 ENCOUNTER — Ambulatory Visit (INDEPENDENT_AMBULATORY_CARE_PROVIDER_SITE_OTHER): Payer: Medicare Other | Admitting: Internal Medicine

## 2021-05-22 VITALS — BP 130/72 | HR 68 | Ht 62.0 in | Wt 155.8 lb

## 2021-05-22 DIAGNOSIS — I639 Cerebral infarction, unspecified: Secondary | ICD-10-CM

## 2021-05-22 DIAGNOSIS — Z23 Encounter for immunization: Secondary | ICD-10-CM

## 2021-05-22 DIAGNOSIS — D509 Iron deficiency anemia, unspecified: Secondary | ICD-10-CM | POA: Diagnosis not present

## 2021-05-22 DIAGNOSIS — R600 Localized edema: Secondary | ICD-10-CM | POA: Diagnosis not present

## 2021-05-22 MED ORDER — FUROSEMIDE 20 MG PO TABS: 40.0000 mg | ORAL_TABLET | Freq: Every day | ORAL | 1 refills | Status: DC

## 2021-05-22 NOTE — Progress Notes (Signed)
Date:  05/22/2021   Name:  Crystal Zavala   DOB:  10/11/1931   MRN:  [id number]   Chief Complaint: Edema  Edema: Patient here today complaining of rapidly worsening edema, and pain in lower legs, ankles, and feet. Patient seen Dr. Ronalee Red at [facility] on 05/07/2021. Patients meds were changed from Diltiazem and start taking Metoprolol in its place. Patient is still taking Lasix 20 mg once a day.  She reports that the edema started over a month ago.  The changes made by the Cardiologist have not helped and recently the swelling is even worse.  It improves slightly with elevation and overnight but recurs quickly once dependent.  Lab Results  Component Value Date   CREATININE 1.0 04/23/2021   BUN 22 (A) 04/23/2021   NA 137 04/23/2021   K 4.6 04/23/2021   CL 105 04/23/2021   CO2 27 (A) 04/23/2021   Lab Results  Component Value Date   CHOL 168 12/25/2020   HDL 81 12/25/2020   LDLCALC 72 12/25/2020   TRIG 79 12/25/2020   CHOLHDL 2.1 12/25/2020   Lab Results  Component Value Date   TSH 0.55 04/23/2021   Lab Results  Component Value Date   HGBA1C 5.2 04/22/2017   Lab Results  Component Value Date   WBC 7.9 04/23/2021   HGB 10.4 (A) 04/23/2021   HCT 34 (A) 04/23/2021   MCV 90 12/25/2020   PLT 377 04/23/2021   Lab Results  Component Value Date   ALT 9 12/25/2020   AST 12 12/25/2020   ALKPHOS 109 12/25/2020   BILITOT 0.5 12/25/2020     Review of Systems  Constitutional:  Negative for chills, fatigue and fever.  Respiratory:  Negative for cough, chest tightness and shortness of breath.   Cardiovascular:  Positive for leg swelling. Negative for chest pain and palpitations.  Musculoskeletal:  Positive for gait problem.  Neurological:  Negative for dizziness and headaches.  Psychiatric/Behavioral:  Negative for dysphoric mood and sleep disturbance. The patient is not nervous/anxious.    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   Spondylosis of lumbar region without myelopathy or radiculopathy 10/21/2016   Hypothyroidism due to acquired atrophy of thyroid 04/22/2016   Insomnia 04/22/2016   Breast mass, right 01/10/2016   Aortic atherosclerosis ([place]) 01/18/2015   Arthritis of neck 01/18/2015   Dermatitis, eczematoid 01/18/2015   Essential (primary) hypertension 01/18/2015   Esophagitis, reflux 01/18/2015   H/O adenomatous polyp of colon 01/18/2015   Migraine without aura and responsive to treatment 01/18/2015   Asthma, mild intermittent 01/18/2015   Hyperlipidemia, mild 01/18/2015   Lung nodule, multiple 01/18/2015   Kidney cysts 01/18/2015   Tobacco use disorder, moderate, in sustained remission 01/18/2015   Temporary cerebral vascular dysfunction 01/18/2015   Dysphagia 10/26/2013    Allergies  Allergen Reactions   Amlodipine Swelling   Tramadol Nausea Only and Rash   Ace Inhibitors Cough    Past Surgical History:  Procedure Laterality Date   ABDOMINAL HYSTERECTOMY     BLEPHAROPLASTY Bilateral 2013   BREAST BIOPSY Left 2009   benign   COLONOSCOPY  2006  Normal   ESI  01/2017   L5-S1 @ [facility]   ESOPHAGOGASTRODUODENOSCOPY  2015   Dilation of esophageal stricture   TOTAL HIP ARTHROPLASTY Right 2019    Social History   Tobacco Use   Smoking status: Former    Packs/day: 0.50    Years: 39.00    Pack years: 19.50    Types: Cigarettes    Quit date: 1992    Years since quitting: 30.8   Smokeless tobacco: Never   Tobacco comments:    smoking cessation materails not required  Vaping Use   Vaping Use: Never used  Substance Use Topics   Alcohol  use: No    Alcohol/week: 0.0 standard drinks   Drug use: No     Medication list has been reviewed and updated.  Current Meds  Medication Sig   albuterol (VENTOLIN HFA) 108 (90 Base) MCG/ACT inhaler Inhale into the lungs every 6 (six) hours as needed for wheezing or shortness of breath.   cetirizine (ZYRTEC) 10 MG tablet Take 10 mg by mouth daily.   ELIQUIS 5 MG TABS tablet Take 5 mg by mouth 2 (two) times daily.   ferrous sulfate 324 MG TBEC Take by mouth.   Fluticasone-Salmeterol (ADVAIR DISKUS) 250-50 MCG/DOSE AEPB Inhale 1 puff into the lungs 2 (two) times daily.   HYDROcodone-acetaminophen (NORCO/VICODIN) 5-325 MG tablet Take 1 tablet by mouth every 6 (six) hours as needed for moderate pain.   levothyroxine (SYNTHROID) 88 MCG tablet TAKE 1 TABLET BY MOUTH DAILY BEFORE BREAKFAST MONDAY THROUGH SATURDAY AND TAKE 1/2 TAB ON SUNDAYS (Patient taking differently: daily.)   magnesium oxide (MAG-OX) 400 MG tablet Take by mouth.   metoprolol succinate (TOPROL-XL) 50 MG 24 hr tablet Take 50 mg by mouth daily.   pantoprazole (PROTONIX) 40 MG tablet TAKE 1 TABLET BY MOUTH ONCE DAILY   potassium chloride (KLOR-CON) 10 MEQ tablet Take 10 mEq by mouth daily.   rosuvastatin (CRESTOR) 5 MG tablet Take 1 tablet (5 mg total) by mouth daily.   traZODone (DESYREL) 150 MG tablet Take 1 tablet (150 mg total) by mouth at bedtime as needed. for sleep   Vitamin D, Cholecalciferol, 50 MCG (2000 UT) CAPS Take 2 capsules by mouth daily.   [DISCONTINUED] furosemide (LASIX) 20 MG tablet Take 20 mg by mouth daily.    PHQ 2/9 Scores 05/22/2021 12/25/2020 12/04/2020 09/15/2020  PHQ - 2 Score 0 0 0 0  PHQ- 9 Score 1 2 - 0    GAD 7 : Generalized Anxiety Score 05/22/2021 12/25/2020 09/15/2020 07/04/2020  Nervous, Anxious, on Edge 0 0 0 0  Control/stop worrying 0 0 0 0  Worry too much - different things 1 1 0 0  Trouble relaxing 0 0 0 0  Restless - 0 0 0  Easily annoyed or irritable 0 0 0 0  Afraid - awful might  happen 0 0 0 0  Total GAD 7 Score - 1 0 0  Anxiety Difficulty Not difficult at all - Not difficult at all Not difficult at all    BP Readings from Last 3 Encounters:  05/22/21 130/72  02/23/21 112/80  12/25/20 (!) 142/80    Physical Exam Vitals and nursing note reviewed.  Constitutional:      General: She is not in acute distress.    Appearance: Normal appearance. She is well-developed.  HENT:     Head: Normocephalic and atraumatic.  Neck:     Vascular: No carotid bruit.  Cardiovascular:     Rate  and Rhythm: Normal rate and regular rhythm.     Pulses: Normal pulses.  Pulmonary:     Effort: Pulmonary effort is normal. No respiratory distress.     Breath sounds: No wheezing or rhonchi.  Musculoskeletal:     Cervical back: Normal range of motion.     Right lower leg: 3+ Edema present.     Left lower leg: 3+ Edema present.  Lymphadenopathy:     Cervical: No cervical adenopathy.  Skin:    General: Skin is warm and dry.     Findings: No rash.  Neurological:     Mental Status: She is alert and oriented to person, place, and time.  Psychiatric:        Mood and Affect: Mood normal.        Behavior: Behavior normal.    Wt Readings from Last 3 Encounters:  05/22/21 155 lb 12.8 oz (70.7 kg)  02/23/21 147 lb (66.7 kg)  12/25/20 149 lb (67.6 kg)    BP 130/72   Pulse 68   Ht 5\' 2"  (1.575 m)   Wt 155 lb 12.8 oz (70.7 kg)   SpO2 99%   BMI 28.50 kg/m   Assessment and Plan: 1. Localized edema Increase lasix to 40 mg daily; increase potassium supplement to 2 per day Has seen Cardiology 2 weeks ago with normal CMP, BNP, TSH Elevated as often as possible. Avoid sodium and increase water intake Follow up in one month - Ambulatory referral to Vascular Surgery - furosemide (LASIX) 20 MG tablet; Take 2 tablets (40 mg total) by mouth daily.  Dispense: 60 tablet; Refill: 1  2. Iron deficiency anemia, unspecified iron deficiency anemia type Continue daily iron and recheck next  visit Anemia not severe enough to cause edema.   Partially dictated using Editor, commissioning. Any errors are unintentional.  Halina Maidens, MD [facility]  05/22/2021

## 2021-05-22 NOTE — Patient Instructions (Signed)
Increase Lasix to 40 mg per day.  Increase potassium to 2 per day

## 2021-05-24 ENCOUNTER — Telehealth: Payer: Self-pay | Admitting: Internal Medicine

## 2021-05-24 NOTE — Telephone Encounter (Signed)
Pt states the Vein clinic Dr Carolin Coy referred her to cannot see her until 10/26/2021. Pt wants to know if there is somewhere else you can refer for a sooner appointment? Pt states anywhere is OK. Please advise.

## 2021-05-28 ENCOUNTER — Telehealth: Payer: Self-pay

## 2021-05-28 NOTE — Telephone Encounter (Signed)
Copied from [place] [id number]. Topic: Referral - Question >> May 28, 2021  9:37 AM Robina Ade, Helene Kelp D wrote: Reason for CRM: Patient called and would liike to know if she can be refer elsewhere for [facility] because they cant get her in until March of next year. Please call patient back, thanks.

## 2021-05-29 NOTE — Telephone Encounter (Signed)
Pt called and is requesting to have an update. She states that there is a [facility] on [address] in [place] and that she would like to be seen there or at [facility].  Please advise.

## 2021-06-07 ENCOUNTER — Other Ambulatory Visit: Payer: Self-pay | Admitting: Internal Medicine

## 2021-06-07 DIAGNOSIS — F5101 Primary insomnia: Secondary | ICD-10-CM

## 2021-06-07 NOTE — Telephone Encounter (Signed)
Requested Prescriptions  Pending Prescriptions Disp Refills  . traZODone (DESYREL) 150 MG tablet [Pharmacy Med Name: traZODone 150 MG TABLET] 90 tablet 0    Sig: TAKE 1 TABLET BY MOUTH EACH NIGHT AT BEDTIME AS NEEDED FOR SLEEP     Psychiatry: Antidepressants - Serotonin Modulator Passed - 06/07/2021  6:07 AM      Passed - Valid encounter within last 6 months    Recent Outpatient Visits          2 weeks ago Localized edema   [facility] Glean Hess, MD   3 months ago Slow transit constipation   [facility] Glean Hess, MD   5 months ago Essential (primary) hypertension   [facility] Glean Hess, MD   6 months ago Ritta Slot, oral   [facility] Glean Hess, MD   8 months ago Hyperlipidemia, mild   [facility] Glean Hess, MD      Future Appointments            In 2 weeks Army Melia Jesse Sans, MD [facility], [place]   In 6 months Army Melia, Jesse Sans, MD [facility], [facility]

## 2021-06-21 ENCOUNTER — Other Ambulatory Visit: Payer: Self-pay | Admitting: Internal Medicine

## 2021-06-21 DIAGNOSIS — I10 Essential (primary) hypertension: Secondary | ICD-10-CM

## 2021-06-21 NOTE — Telephone Encounter (Signed)
Requested medications are due for refill today.  unsure  Requested medications are on the active medications list.  no  Last refill. 12/25/2020  Future visit scheduled.   yes  Notes to clinic.  Medication was d/c'd 05/22/2021 - change in therapy.

## 2021-06-22 ENCOUNTER — Ambulatory Visit: Payer: No Typology Code available for payment source | Admitting: Internal Medicine

## 2021-06-25 ENCOUNTER — Telehealth: Payer: Self-pay | Admitting: Internal Medicine

## 2021-06-25 NOTE — Telephone Encounter (Signed)
Paiient called in states heart doctor changed  med, from diltiazem (TIAZAC) 120 MG 24 hr capsule  to  metropolol

## 2021-06-25 NOTE — Telephone Encounter (Signed)
Will update med list at next appt. Thank you.

## 2021-06-27 ENCOUNTER — Ambulatory Visit: Payer: Medicare Other | Admitting: Internal Medicine

## 2021-08-09 ENCOUNTER — Ambulatory Visit: Payer: Self-pay | Admitting: *Deleted

## 2021-08-09 NOTE — Telephone Encounter (Signed)
For your information  

## 2021-08-09 NOTE — Telephone Encounter (Signed)
°  Chief Complaint: upper midline abdominal pain Symptoms: pain, knots in the area- tender to palpitation, decreased appetite, nausea  Frequency: 1 week Pertinent Negatives: Patient denies chest pain Disposition: [] ED /[] Urgent Care (no appt availability in office) / [x] Appointment(In office/virtual)/ []  [facility]/ [] Home Care/ [] Refused Recommended Disposition /[] [facility]/ []  Follow-up with PCP Additional Notes: Patient states she can not go to ED- she can not handle it- call to office- appointment made for am   Reason for Disposition  [1] MILD-MODERATE pain AND [2] constant AND [3] present > 2 hours  Answer Assessment - Initial Assessment Questions 1. LOCATION: "Where does it hurt?"      Upper-knot present- center and R/L 2. RADIATION: "Does the pain shoot anywhere else?" (e.g., chest, back)     no 3. ONSET: "When did the pain begin?" (e.g., minutes, hours or days ago)      1 week- knots present couple months 4. SUDDEN: "Gradual or sudden onset?"     gradual 5. PATTERN "Does the pain come and go, or is it constant?"    - If constant: "Is it getting better, staying the same, or worsening?"      (Note: Constant means the pain never goes away completely; most serious pain is constant and it progresses)     - If intermittent: "How long does it last?" "Do you have pain now?"     (Note: Intermittent means the pain goes away completely between bouts)     constant 6. SEVERITY: "How bad is the pain?"  (e.g., Scale 1-10; mild, moderate, or severe)   - MILD (1-3): doesn't interfere with normal activities, abdomen soft and not tender to touch    - MODERATE (4-7): interferes with normal activities or awakens from sleep, abdomen tender to touch    - SEVERE (8-10): excruciating pain, doubled over, unable to do any normal activities      moderate 7. RECURRENT SYMPTOM: "Have you ever had this type of stomach pain before?" If Yes, ask: "When was the last time?" and "What  happened that time?"      no 8. CAUSE: "What do you think is causing the stomach pain?"     unsure 9. RELIEVING/AGGRAVATING FACTORS: "What makes it better or worse?" (e.g., movement, antacids, bowel movement)     No 10. OTHER SYMPTOMS: "Do you have any other symptoms?" (e.g., back pain, diarrhea, fever, urination pain, vomiting)       no appitiete, nausea, some constipation- hard stool 11. PREGNANCY: "Is there any chance you are pregnant?" "When was your last menstrual period?"       na  Protocols used: Abdominal Pain - Female-A-AH, Abdominal Pain - Upper-A-AH

## 2021-08-10 ENCOUNTER — Other Ambulatory Visit: Payer: Self-pay

## 2021-08-10 ENCOUNTER — Encounter: Payer: Self-pay | Admitting: Internal Medicine

## 2021-08-10 ENCOUNTER — Ambulatory Visit (INDEPENDENT_AMBULATORY_CARE_PROVIDER_SITE_OTHER): Payer: Medicare Other | Admitting: Internal Medicine

## 2021-08-10 VITALS — BP 138/78 | HR 75 | Temp 98.4°F | Ht 62.0 in | Wt 143.8 lb

## 2021-08-10 DIAGNOSIS — K5903 Drug induced constipation: Secondary | ICD-10-CM

## 2021-08-10 NOTE — Patient Instructions (Signed)
Take Miralax twice a day. Call in a week if not improved.

## 2021-08-10 NOTE — Progress Notes (Signed)
Date:  08/10/2021   Name:  Crystal Zavala   DOB:  Jul 08, 1932   MRN:  [id number]   Chief Complaint: Abdominal Pain  Abdominal Pain This is a new problem. The current episode started 1 to 4 weeks ago. The onset quality is gradual. The problem occurs constantly. The problem has been waxing and waning. The pain is located in the generalized abdominal region. The quality of the pain is cramping and sharp. Associated symptoms include constipation and nausea. Pertinent negatives include no belching, fever, flatus or vomiting.   Lab Results  Component Value Date   NA 137 04/23/2021   K 4.6 04/23/2021   CO2 27 (A) 04/23/2021   GLUCOSE 101 (H) 12/25/2020   BUN 22 (A) 04/23/2021   CREATININE 1.0 04/23/2021   CALCIUM 10.2 04/23/2021   EGFR 51 (L) 12/25/2020   GFRNONAA 55 04/23/2021   Lab Results  Component Value Date   CHOL 168 12/25/2020   HDL 81 12/25/2020   LDLCALC 72 12/25/2020   TRIG 79 12/25/2020   CHOLHDL 2.1 12/25/2020   Lab Results  Component Value Date   TSH 0.55 04/23/2021   Lab Results  Component Value Date   HGBA1C 5.2 04/22/2017   Lab Results  Component Value Date   WBC 7.9 04/23/2021   HGB 10.4 (A) 04/23/2021   HCT 34 (A) 04/23/2021   MCV 90 12/25/2020   PLT 377 04/23/2021   Lab Results  Component Value Date   ALT 9 12/25/2020   AST 12 12/25/2020   ALKPHOS 109 12/25/2020   BILITOT 0.5 12/25/2020   No results found for: 25OHVITD2, 25OHVITD3, VD25OH   Review of Systems  Constitutional:  Negative for chills, diaphoresis, fatigue and fever.  Respiratory:  Negative for chest tightness and shortness of breath.   Cardiovascular:  Negative for chest pain.  Gastrointestinal:  Positive for abdominal distention, constipation and nausea. Negative for abdominal pain, blood in stool, flatus, rectal pain and vomiting.   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   Spondylosis of lumbar region without myelopathy or radiculopathy 10/21/2016   Hypothyroidism due to acquired atrophy of thyroid 04/22/2016   Insomnia 04/22/2016   Aortic atherosclerosis ([place]) 01/18/2015   Arthritis of neck 01/18/2015   Dermatitis, eczematoid 01/18/2015   Essential (primary) hypertension 01/18/2015   Esophagitis, reflux 01/18/2015   H/O adenomatous polyp of colon 01/18/2015   Migraine without aura and responsive to treatment 01/18/2015   Asthma, mild intermittent 01/18/2015   Hyperlipidemia, mild 01/18/2015   Lung nodule, multiple 01/18/2015   Kidney cysts 01/18/2015   Tobacco use disorder, moderate, in sustained remission 01/18/2015   Temporary cerebral vascular dysfunction 01/18/2015   Dysphagia 10/26/2013    Allergies  Allergen Reactions   Amlodipine Swelling   Tramadol Nausea Only and Rash   Ace Inhibitors Cough    Past Surgical History:  Procedure Laterality Date   ABDOMINAL HYSTERECTOMY     BLEPHAROPLASTY Bilateral 2013   BREAST BIOPSY Left 2009   benign   COLONOSCOPY  2006   Normal   ESI  01/2017   L5-S1 @ [facility]   ESOPHAGOGASTRODUODENOSCOPY  2015   Dilation of esophageal stricture   TOTAL  HIP ARTHROPLASTY Right 2019    Social History   Tobacco Use   Smoking status: Former    Packs/day: 0.50    Years: 39.00    Pack years: 19.50    Types: Cigarettes    Quit date: 1992    Years since quitting: 31.0   Smokeless tobacco: Never   Tobacco comments:    smoking cessation materails not required  Vaping Use   Vaping Use: Never used  Substance Use Topics   Alcohol use: No    Alcohol/week: 0.0 standard drinks   Drug use: No     Medication list has been reviewed and updated.  Current Meds   Medication Sig   albuterol (VENTOLIN HFA) 108 (90 Base) MCG/ACT inhaler Inhale into the lungs every 6 (six) hours as needed for wheezing or shortness of breath.   cetirizine (ZYRTEC) 10 MG tablet Take 10 mg by mouth daily.   ELIQUIS 5 MG TABS tablet Take 5 mg by mouth 2 (two) times daily.   Fluticasone-Salmeterol (ADVAIR DISKUS) 250-50 MCG/DOSE AEPB Inhale 1 puff into the lungs 2 (two) times daily.   HYDROcodone-acetaminophen (NORCO/VICODIN) 5-325 MG tablet Take 1 tablet by mouth every 6 (six) hours as needed for moderate pain.   levothyroxine (SYNTHROID) 88 MCG tablet TAKE 1 TABLET BY MOUTH DAILY BEFORE BREAKFAST MONDAY THROUGH SATURDAY AND TAKE 1/2 TAB ON SUNDAYS (Patient taking differently: daily.)   metoprolol succinate (TOPROL-XL) 50 MG 24 hr tablet Take 50 mg by mouth daily.   oxybutynin (DITROPAN-XL) 5 MG 24 hr tablet Take 5 mg by mouth daily.   pantoprazole (PROTONIX) 40 MG tablet TAKE 1 TABLET BY MOUTH ONCE DAILY   rosuvastatin (CRESTOR) 5 MG tablet Take 1 tablet (5 mg total) by mouth daily.   traZODone (DESYREL) 150 MG tablet TAKE 1 TABLET BY MOUTH EACH NIGHT AT BEDTIME AS NEEDED FOR SLEEP   Vitamin D, Cholecalciferol, 50 MCG (2000 UT) CAPS Take 2 capsules by mouth daily.    PHQ 2/9 Scores 08/10/2021 05/22/2021 12/25/2020 12/04/2020  PHQ - 2 Score 0 0 0 0  PHQ- 9 Score 3 1 2  -    GAD 7 : Generalized Anxiety Score 08/10/2021 05/22/2021 12/25/2020 09/15/2020  Nervous, Anxious, on Edge 1 0 0 0  Control/stop worrying 0 0 0 0  Worry too much - different things 0 1 1 0  Trouble relaxing 0 0 0 0  Restless 0 - 0 0  Easily annoyed or irritable 0 0 0 0  Afraid - awful might happen 0 0 0 0  Total GAD 7 Score 1 - 1 0  Anxiety Difficulty Not difficult at all Not difficult at all - Not difficult at all    BP Readings from Last 3 Encounters:  08/10/21 138/78  05/22/21 130/72  02/23/21 112/80    Physical Exam Vitals and nursing note reviewed.  Constitutional:      General: She is not in  acute distress.    Appearance: She is well-developed.  HENT:     Head: Normocephalic and atraumatic.  Pulmonary:     Effort: Pulmonary effort is normal. No respiratory distress.  Abdominal:     General: Abdomen is flat. Bowel sounds are normal.     Palpations: Abdomen is soft. There is no fluid wave, hepatomegaly, splenomegaly or mass.     Tenderness: There is abdominal tenderness in the left lower quadrant.  Skin:    General: Skin is warm and dry.     Findings: No rash.  Neurological:  Mental Status: She is alert and oriented to person, place, and time.  Psychiatric:        Mood and Affect: Mood normal.        Behavior: Behavior normal.    Wt Readings from Last 3 Encounters:  08/10/21 143 lb 12.8 oz (65.2 kg)  05/22/21 155 lb 12.8 oz (70.7 kg)  02/23/21 147 lb (66.7 kg)    BP 138/78    Pulse 75    Temp 98.4 F (36.9 C) (Oral)    Ht 5' 2"  (1.575 m)    Wt 143 lb 12.8 oz (65.2 kg)    SpO2 98%    BMI 26.30 kg/m   Assessment and Plan: 1. Drug-induced constipation No worrisome findings or symptoms. Continue Miralax but increase dose to bid. Call in one week for follow up if no improvement.   Partially dictated using Editor, commissioning. Any errors are unintentional.  Halina Maidens, MD [facility]  08/10/2021

## 2021-08-18 ENCOUNTER — Other Ambulatory Visit: Payer: Self-pay | Admitting: Internal Medicine

## 2021-08-18 NOTE — Telephone Encounter (Signed)
Requested Prescriptions  Pending Prescriptions Disp Refills   spironolactone (ALDACTONE) 25 MG tablet [Pharmacy Med Name: SPIRONOLACTONE 25 MG TABLET] 90 tablet     Sig: TAKE ONE TABLET BY MOUTH DAILY     Cardiovascular: Diuretics - Aldosterone Antagonist Passed - 08/18/2021  6:37 AM      Passed - Cr in normal range and within 360 days    Creatinine  Date Value Ref Range Status  04/23/2021 1.0 0.5 - 1.1 Final   Creatinine, Ser  Date Value Ref Range Status  12/25/2020 1.05 (H) 0.57 - 1.00 mg/dL Final         Passed - K in normal range and within 360 days    Potassium  Date Value Ref Range Status  04/23/2021 4.6 3.4 - 5.3 Final         Passed - Na in normal range and within 360 days    Sodium  Date Value Ref Range Status  04/23/2021 137 137 - 147 Final         Passed - Last BP in normal range    BP Readings from Last 1 Encounters:  08/10/21 138/78         Passed - Valid encounter within last 6 months    Recent Outpatient Visits          1 week ago Drug-induced constipation   [facility] Glean Hess, MD   2 months ago Localized edema   [facility] Glean Hess, MD   5 months ago Slow transit constipation   [facility] Glean Hess, MD   7 months ago Essential (primary) hypertension   [facility] Glean Hess, MD   8 months ago Ritta Slot, oral   [facility] Glean Hess, MD      Future Appointments            In 4 months Army Melia, Jesse Sans, MD [facility], [facility]

## 2021-09-08 ENCOUNTER — Other Ambulatory Visit: Payer: Self-pay | Admitting: Internal Medicine

## 2021-09-08 DIAGNOSIS — F5101 Primary insomnia: Secondary | ICD-10-CM

## 2021-09-08 NOTE — Telephone Encounter (Signed)
Requested Prescriptions  Pending Prescriptions Disp Refills   traZODone (DESYREL) 150 MG tablet [Pharmacy Med Name: traZODone 150 MG TABLET] 90 tablet 0    Sig: TAKE 1 TABLET BY MOUTH EVERY NIGHT AT BEDTIME AS NEEDED FOR SLEEP     Psychiatry: Antidepressants - Serotonin Modulator Passed - 09/08/2021  6:37 AM      Passed - Valid encounter within last 6 months    Recent Outpatient Visits          4 weeks ago Drug-induced constipation   [facility] Glean Hess, MD   3 months ago Localized edema   [facility] Glean Hess, MD   6 months ago Slow transit constipation   [facility] Glean Hess, MD   8 months ago Essential (primary) hypertension   [facility] Glean Hess, MD   9 months ago Ritta Slot, oral   [facility] Glean Hess, MD      Future Appointments            In 3 months Army Melia, Jesse Sans, MD [facility], [facility]

## 2021-10-23 ENCOUNTER — Other Ambulatory Visit: Payer: Self-pay | Admitting: Internal Medicine

## 2021-10-23 DIAGNOSIS — K2100 Gastro-esophageal reflux disease with esophagitis, without bleeding: Secondary | ICD-10-CM

## 2021-10-24 NOTE — Telephone Encounter (Signed)
Requested Prescriptions  ?Pending Prescriptions Disp Refills  ?? pantoprazole (PROTONIX) 40 MG tablet [Pharmacy Med Name: PANTOPRAZOLE SOD DR 40 MG TAB] 90 tablet 1  ?  Sig: TAKE ONE TABLET BY MOUTH DAILY  ?  ? Gastroenterology: Proton Pump Inhibitors Passed - 10/23/2021 10:29 AM  ?  ?  Passed - Valid encounter within last 12 months  ?  Recent Outpatient Visits   ?      ? 2 months ago Drug-induced constipation  ? [facility] Glean Hess, MD  ? 5 months ago Localized edema  ? [facility] Glean Hess, MD  ? 8 months ago Slow transit constipation  ? [facility] Glean Hess, MD  ? 10 months ago Essential (primary) hypertension  ? [facility] Glean Hess, MD  ? 10 months ago Ritta Slot, oral  ? [facility] Glean Hess, MD  ?  ?  ?Future Appointments   ?        ? In 2 months Army Melia Jesse Sans, MD [facility], [place]  ?  ? ?  ?  ?  ? ? ?

## 2021-11-02 ENCOUNTER — Ambulatory Visit (INDEPENDENT_AMBULATORY_CARE_PROVIDER_SITE_OTHER): Payer: Medicare Other | Admitting: Internal Medicine

## 2021-11-02 ENCOUNTER — Encounter: Payer: Self-pay | Admitting: Internal Medicine

## 2021-11-02 ENCOUNTER — Ambulatory Visit: Payer: Self-pay

## 2021-11-02 VITALS — BP 138/82 | HR 69 | Ht 62.0 in | Wt 142.0 lb

## 2021-11-02 DIAGNOSIS — B370 Candidal stomatitis: Secondary | ICD-10-CM | POA: Diagnosis not present

## 2021-11-02 DIAGNOSIS — I700 Atherosclerosis of aorta: Secondary | ICD-10-CM

## 2021-11-02 DIAGNOSIS — I480 Paroxysmal atrial fibrillation: Secondary | ICD-10-CM

## 2021-11-02 DIAGNOSIS — D6869 Other thrombophilia: Secondary | ICD-10-CM

## 2021-11-02 DIAGNOSIS — R194 Change in bowel habit: Secondary | ICD-10-CM

## 2021-11-02 MED ORDER — NYSTATIN 100000 UNIT/ML MT SUSP: 5.0000 mL | Freq: Four times a day (QID) | OROMUCOSAL | 0 refills | Status: AC

## 2021-11-02 NOTE — Progress Notes (Signed)
? ? ?Date:  11/02/2021  ? ?Name:  Crystal Zavala   DOB:  February 13, 1932   MRN:  [id number] ? ? ?Chief Complaint: Abdominal Pain (X 4-5 weeks, getting worse, Bloating, gasy, BMs are not normal (loose stools)/) ? ?Abdominal Pain ?This is a recurrent problem. Episode onset: 4-5 weeks. The problem occurs 2 to 4 times per day. The problem has been gradually worsening. The pain is located in the LUQ and RUQ. The quality of the pain is aching. Associated symptoms include diarrhea, flatus and headaches. Pertinent negatives include no constipation, fever, nausea or vomiting. She has tried nothing for the symptoms. Her past medical history is significant for GERD.  ? ?Lab Results  ?Component Value Date  ? NA 137 04/23/2021  ? K 4.6 04/23/2021  ? CO2 27 (A) 04/23/2021  ? GLUCOSE 101 (H) 12/25/2020  ? BUN 22 (A) 04/23/2021  ? CREATININE 1.0 04/23/2021  ? CALCIUM 10.2 04/23/2021  ? EGFR 51 (L) 12/25/2020  ? GFRNONAA 55 04/23/2021  ? ?Lab Results  ?Component Value Date  ? CHOL 168 12/25/2020  ? HDL 81 12/25/2020  ? [place] 72 12/25/2020  ? TRIG 79 12/25/2020  ? CHOLHDL 2.1 12/25/2020  ? ?Lab Results  ?Component Value Date  ? TSH 0.55 04/23/2021  ? ?Lab Results  ?Component Value Date  ? HGBA1C 5.2 04/22/2017  ? ?Lab Results  ?Component Value Date  ? WBC 7.9 04/23/2021  ? HGB 10.4 (A) 04/23/2021  ? HCT 34 (A) 04/23/2021  ? MCV 90 12/25/2020  ? PLT 377 04/23/2021  ? ?Lab Results  ?Component Value Date  ? ALT 9 12/25/2020  ? AST 12 12/25/2020  ? ALKPHOS 109 12/25/2020  ? BILITOT 0.5 12/25/2020  ? ?No results found for: 25OHVITD2, [place], VD25OH  ? ?Review of Systems  ?Constitutional:  Negative for chills, fatigue, fever and unexpected weight change.  ?HENT:  Positive for mouth sores. Negative for trouble swallowing.   ?Respiratory:  Negative for shortness of breath and wheezing.   ?Cardiovascular:  Negative for chest pain.  ?Gastrointestinal:  Positive for abdominal pain, diarrhea and flatus. Negative for blood in stool, constipation,  nausea and vomiting.  ?Neurological:  Positive for headaches.  ?Psychiatric/Behavioral:  Negative for dysphoric mood and sleep disturbance. The patient is not nervous/anxious.   ? ?Patient Active Problem List  ? Diagnosis Date Noted  ? Cerebrovascular accident (CVA) ([place]) 05/10/2020  ? Decreased functional activity tolerance 02/01/2020  ? Acquired thrombophilia ([place]) 01/18/2020  ? Paroxysmal atrial fibrillation ([place]) 02/23/2019  ? Pain of both hip joints 04/22/2017  ? Renal insufficiency 01/05/2017  ? 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 Spondylosis of lumbar region without myelopathy or radiculopathy 10/21/2016  ? Hypothyroidism due to acquired atrophy of thyroid 04/22/2016  ? Insomnia 04/22/2016  ? Aortic atherosclerosis ([place]) 01/18/2015  ? Arthritis of neck 01/18/2015  ? Dermatitis, eczematoid 01/18/2015  ? Essential (primary) hypertension 01/18/2015  ? Esophagitis, reflux 01/18/2015  ? H/O adenomatous polyp of colon 01/18/2015  ? Migraine without aura and responsive to treatment 01/18/2015  ? Asthma, mild intermittent 01/18/2015  ? Hyperlipidemia, mild 01/18/2015  ? Lung nodule, multiple 01/18/2015  ? Kidney cysts 01/18/2015  ? Tobacco use disorder, moderate, in sustained remission 01/18/2015  ? Temporary cerebral vascular dysfunction 01/18/2015  ? Dysphagia 10/26/2013  ? ? ?Allergies  ?Allergen Reactions  ? Amlodipine Swelling  ? Tramadol Nausea Only and Rash  ? Ace Inhibitors Cough  ? ? ?Past Surgical History:  ?Procedure Laterality  Date  ? ABDOMINAL HYSTERECTOMY    ? BLEPHAROPLASTY Bilateral 2013  ? BREAST BIOPSY Left 2009  ? benign  ? COLONOSCOPY  2006  ? Normal  ? ESI  01/2017  ? L5-S1 @ [facility]  ? ESOPHAGOGASTRODUODENOSCOPY  2015  ? Dilation of esophageal stricture  ? TOTAL HIP ARTHROPLASTY Right 2019  ? ? ?Social History  ? ?Tobacco Use  ? Smoking status: Former  ?  Packs/day: 0.50  ?  Years: 39.00  ?  Pack years: 19.50  ?  Types: Cigarettes  ?  Quit date: 31  ?  Years  since quitting: 31.2  ? Smokeless tobacco: Never  ? Tobacco comments:  ?  smoking cessation materails not required  ?Vaping Use  ? Vaping Use: Never used  ?Substance Use Topics  ? Alcohol use: No  ?  Alcohol/week: 0.0 standard drinks  ? Drug use: No  ? ? ? ?Medication list has been reviewed and updated. ? ?Current Meds  ?Medication Sig  ? albuterol (VENTOLIN HFA) 108 (90 Base) MCG/ACT inhaler Inhale into the lungs every 6 (six) hours as needed for wheezing or shortness of breath.  ? cetirizine (ZYRTEC) 10 MG tablet Take 10 mg by mouth daily.  ? diltiazem (TIAZAC) 180 MG 24 hr capsule diltiazem CD 180 mg capsule,extended release 24 hr ? TAKE 1 CAPSULE (180 MG TOTAL) BY MOUTH ONCE DAILY  ? ELIQUIS 5 MG TABS tablet Take 5 mg by mouth 2 (two) times daily.  ? Fluticasone-Salmeterol (ADVAIR DISKUS) 250-50 MCG/DOSE AEPB Inhale 1 puff into the lungs 2 (two) times daily.  ? furosemide (LASIX) 20 MG tablet Take 2 tablets (40 mg total) by mouth daily. (Patient taking differently: Take 40 mg by mouth as needed.)  ? HYDROcodone-acetaminophen (NORCO/VICODIN) 5-325 MG tablet Take 1 tablet by mouth every 6 (six) hours as needed for moderate pain.  ? irbesartan (AVAPRO) 300 MG tablet SMARTSIG:1 By Mouth  ? levothyroxine (SYNTHROID) 88 MCG tablet TAKE 1 TABLET BY MOUTH DAILY BEFORE BREAKFAST MONDAY THROUGH SATURDAY AND TAKE 1/2 TAB ON SUNDAYS (Patient taking differently: daily.)  ? metoprolol succinate (TOPROL-XL) 50 MG 24 hr tablet Take 50 mg by mouth daily.  ? nystatin cream (MYCOSTATIN) nystatin 100,000 unit/gram topical cream  ? oxybutynin (DITROPAN-XL) 5 MG 24 hr tablet Take 5 mg by mouth daily.  ? pantoprazole (PROTONIX) 40 MG tablet TAKE ONE TABLET BY MOUTH DAILY  ? potassium chloride 20 MEQ/15ML (10%) SOLN potassium chloride 20 mEq/15 mL oral liquid  ? rosuvastatin (CRESTOR) 5 MG tablet Take 1 tablet (5 mg total) by mouth daily.  ? traZODone (DESYREL) 150 MG tablet TAKE 1 TABLET BY MOUTH EVERY NIGHT AT BEDTIME AS NEEDED FOR  SLEEP  ? Vitamin D, Cholecalciferol, 50 MCG (2000 UT) CAPS Take 2 capsules by mouth daily.  ? [DISCONTINUED] clopidogrel (PLAVIX) 75 MG tablet SMARTSIG:1 By Mouth  ? [DISCONTINUED] ferrous sulfate 324 MG TBEC Take by mouth.  ? [DISCONTINUED] traMADol (ULTRAM) 50 MG tablet SMARTSIG:1 By Mouth  ? ? ? ?  11/02/2021  ?  1:27 PM 08/10/2021  ? 10:50 AM 05/22/2021  ?  9:26 AM 12/25/2020  ?  9:56 AM  ?GAD 7 : Generalized Anxiety Score  ?Nervous, Anxious, on Edge 1 1 0 0  ?Control/stop worrying 0 0 0 0  ?Worry too much - different things 0 0 1 1  ?Trouble relaxing 0 0 0 0  ?Restless 0 0  0  ?Easily annoyed or irritable 0 0 0 0  ?Afraid - awful  might happen 0 0 0 0  ?Total GAD 7 Score _0 ?Anxiety Difficulty  Not difficult at all Not difficult at all   ? ? ? ?  11/02/2021  ?  1:27 PM  ?Depression screen PHQ 2/9  ?Decreased Interest 0  ?Down, Depressed, Hopeless 0  ?PHQ - 2 Score 0  ?Altered sleeping 1  ?Tired, decreased energy 0  ?Change in appetite 0  ?Feeling bad or failure about yourself  0  ?Trouble concentrating 0  ?Moving slowly or fidgety/restless 0  ?Suicidal thoughts 0  ?PHQ-9 Score 1  ? ? ?BP Readings from Last 3 Encounters:  ?11/02/21 138/82  ?08/10/21 138/78  ?05/22/21 130/72  ? ? ?Physical Exam ?Vitals and nursing note reviewed.  ?Constitutional:   ?   General: She is not in acute distress. ?   Appearance: She is well-developed.  ?HENT:  ?   Head: Normocephalic and atraumatic.  ?   Mouth/Throat:  ?   Mouth: Mucous membranes are moist. Oral lesions (white coating on tongue) present.  ?Cardiovascular:  ?   Rate and Rhythm: Normal rate and regular rhythm.  ?Pulmonary:  ?   Effort: Pulmonary effort is normal. No respiratory distress.  ?   Breath sounds: Normal breath sounds.  ?Abdominal:  ?   General: Abdomen is flat. Bowel sounds are normal.  ?   Palpations: Abdomen is soft.  ?   Tenderness: There is no abdominal tenderness. There is no guarding or rebound.  ?Skin: ?   General: Skin is warm and dry.  ?   Findings: No  rash.  ?Neurological:  ?   Mental Status: She is alert and oriented to person, place, and time.  ?Psychiatric:     ?   Mood and Affect: Mood normal.     ?   Behavior: Behavior normal.  ? ? ?Wt Readings f

## 2021-11-02 NOTE — Telephone Encounter (Signed)
? ?  Chief Complaint: Abdominal pain,3/10, bloating ?Symptoms: Above ?Frequency: Started several weeks ago ?Pertinent Negatives: Patient denies diarrhea, constipation ?Disposition: '[]'$ ED /'[]'$ Urgent Care (no appt availability in office) / '[x]'$ Appointment(In office/virtual)/ '[]'$  [place] [facility]/ '[]'$ Home Care/ '[]'$ Refused Recommended Disposition /'[]'$ [place] [facility]/ '[]'$  Follow-up with PCP ?Additional Notes:   ?Reason for Disposition ? [1] MILD pain (e.g., does not interfere with normal activities) AND [2] pain comes and goes (cramps) AND [3] present > 48 hours  (Exception: this same abdominal pain is a chronic symptom recurrent or ongoing AND present > 4 weeks) ? ?Answer Assessment - Initial Assessment Questions ?1. LOCATION: "Where does it hurt?"  ?    All across ?2. RADIATION: "Does the pain shoot anywhere else?" (e.g., chest, back) ?    No ?3. ONSET: "When did the pain begin?" (e.g., minutes, hours or days ago)  ?    Several weeks ?4. SUDDEN: "Gradual or sudden onset?" ?    Gradual ?5. PATTERN "Does the pain come and go, or is it constant?" ?   - If constant: "Is it getting better, staying the same, or worsening?"  ?    (Note: Constant means the pain never goes away completely; most serious pain is constant and it progresses)  ?   - If intermittent: "How long does it last?" "Do you have pain now?" ?    (Note: Intermittent means the pain goes away completely between bouts) ?    Constant ?6. SEVERITY: "How bad is the pain?"  (e.g., Scale 1-10; mild, moderate, or severe) ?  - MILD (1-3): doesn't interfere with normal activities, abdomen soft and not tender to touch  ?  - MODERATE (4-7): interferes with normal activities or awakens from sleep, abdomen tender to touch  ?  - SEVERE (8-10): excruciating pain, doubled over, unable to do any normal activities  ?    3 ?7. RECURRENT SYMPTOM: "Have you ever had this type of stomach pain before?" If Yes, ask: "When was the last time?" and "What happened that time?"  ?     No ?8. CAUSE: "What do you think is causing the stomach pain?" ?    Unsure ?9. RELIEVING/AGGRAVATING FACTORS: "What makes it better or worse?" (e.g., movement, antacids, bowel movement) ?    No ?10. OTHER SYMPTOMS: "Do you have any other symptoms?" (e.g., back pain, diarrhea, fever, urination pain, vomiting) ?      Bloating ?11. PREGNANCY: "Is there any chance you are pregnant?" "When was your last menstrual period?" ?      No ? ?Protocols used: Abdominal Pain - Female-A-AH ? ?

## 2021-11-03 LAB — COMPREHENSIVE METABOLIC PANEL
ALT: 8 IU/L (ref 0–32)
AST: 13 IU/L (ref 0–40)
Albumin/Globulin Ratio: 1.9 (ref 1.2–2.2)
Albumin: 3.9 g/dL (ref 3.6–4.6)
Alkaline Phosphatase: 116 IU/L (ref 44–121)
BUN/Creatinine Ratio: 35 — ABNORMAL HIGH (ref 12–28)
BUN: 37 mg/dL — ABNORMAL HIGH (ref 8–27)
Bilirubin Total: 0.4 mg/dL (ref 0.0–1.2)
CO2: 23 mmol/L (ref 20–29)
Calcium: 10.1 mg/dL (ref 8.7–10.3)
Chloride: 106 mmol/L (ref 96–106)
Creatinine, Ser: 1.05 mg/dL — ABNORMAL HIGH (ref 0.57–1.00)
Globulin, Total: 2.1 g/dL (ref 1.5–4.5)
Glucose: 116 mg/dL — ABNORMAL HIGH (ref 70–99)
Potassium: 5 mmol/L (ref 3.5–5.2)
Sodium: 141 mmol/L (ref 134–144)
Total Protein: 6 g/dL (ref 6.0–8.5)
eGFR: 51 mL/min/{1.73_m2} — ABNORMAL LOW (ref >59)

## 2021-11-03 LAB — CBC WITH DIFFERENTIAL/PLATELET
Basophils Absolute: 0 10*3/uL (ref 0.0–0.2)
Basos: 0 %
EOS (ABSOLUTE): 0.1 10*3/uL (ref 0.0–0.4)
Eos: 0 %
Hematocrit: 39.1 % (ref 34.0–46.6)
Hemoglobin: 12.4 g/dL (ref 11.1–15.9)
Immature Grans (Abs): 0.1 10*3/uL (ref 0.0–0.1)
Immature Granulocytes: 1 %
Lymphocytes Absolute: 1.4 10*3/uL (ref 0.7–3.1)
Lymphs: 12 %
MCH: 29.5 pg (ref 26.6–33.0)
MCHC: 31.7 g/dL (ref 31.5–35.7)
MCV: 93 fL (ref 79–97)
Monocytes Absolute: 0.8 10*3/uL (ref 0.1–0.9)
Monocytes: 7 %
Neutrophils Absolute: 9.2 10*3/uL — ABNORMAL HIGH (ref 1.4–7.0)
Neutrophils: 80 %
Platelets: 351 10*3/uL (ref 150–450)
RBC: 4.2 x10E6/uL (ref 3.77–5.28)
RDW: 13.4 % (ref 11.7–15.4)
WBC: 11.5 10*3/uL — ABNORMAL HIGH (ref 3.4–10.8)

## 2021-11-03 LAB — AMYLASE: Amylase: 80 U/L (ref 31–110)

## 2021-11-05 ENCOUNTER — Other Ambulatory Visit: Payer: Self-pay

## 2021-11-05 MED ORDER — AMOXICILLIN-POT CLAVULANATE 875-125 MG PO TABS: 1.0000 | ORAL_TABLET | Freq: Two times a day (BID) | ORAL | 0 refills | Status: DC

## 2021-11-12 ENCOUNTER — Ambulatory Visit: Payer: Self-pay

## 2021-11-12 NOTE — Telephone Encounter (Signed)
PATIENT ASKED THAT [name] PUT IN A REFERRAL IN [name]. ?

## 2021-11-12 NOTE — Telephone Encounter (Signed)
? ? ?  Chief Complaint: Continued abdominal pain, seen 11/02/21 ?Symptoms: Discomfort,bloating, watery stools ?Frequency: Several weeks ?Pertinent Negatives: Patient denies fever ?Disposition: '[]'$ ED /'[]'$ Urgent Care (no appt availability in office) / '[]'$ Appointment(In office/virtual)/ '[]'$  [place] [facility]/ '[]'$ Home Care/ '[]'$ Refused Recommended Disposition /'[]'$ [place] [facility]/ '[]'$  Follow-up with PCP ?Additional Notes: Pt. Is asking for a CT Scan. Please advise pt.  ?Answer Assessment - Initial Assessment Questions ?1. LOCATION: "Where does it hurt?"  ?    All over ?2. RADIATION: "Does the pain shoot anywhere else?" (e.g., chest, back) ?    No ?3. ONSET: "When did the pain begin?" (e.g., minutes, hours or days ago)  ?    2 months ago ?4. SUDDEN: "Gradual or sudden onset?" ?    Gradual ?5. PATTERN "Does the pain come and go, or is it constant?" ?   - If constant: "Is it getting better, staying the same, or worsening?"  ?    (Note: Constant means the pain never goes away completely; most serious pain is constant and it progresses)  ?   - If intermittent: "How long does it last?" "Do you have pain now?" ?    (Note: Intermittent means the pain goes away completely between bouts) ?    Constant discomfort ?6. SEVERITY: "How bad is the pain?"  (e.g., Scale 1-10; mild, moderate, or severe) ?  - MILD (1-3): doesn't interfere with normal activities, abdomen soft and not tender to touch  ?  - MODERATE (4-7): interferes with normal activities or awakens from sleep, abdomen tender to touch  ?  - SEVERE (8-10): excruciating pain, doubled over, unable to do any normal activities  ?    Mild ?7. RECURRENT SYMPTOM: "Have you ever had this type of stomach pain before?" If Yes, ask: "When was the last time?" and "What happened that time?"  ?    No ?8. CAUSE: "What do you think is causing the stomach pain?" ?    Unsure ?9. RELIEVING/AGGRAVATING FACTORS: "What makes it better or worse?" (e.g., movement, antacids, bowel movement) ?     Unsure ?10. OTHER SYMPTOMS: "Do you have any other symptoms?" (e.g., back pain, diarrhea, fever, urination pain, vomiting) ?      Watery stools ?11. PREGNANCY: "Is there any chance you are pregnant?" "When was your last menstrual period?" ?      No ? ?Protocols used: Abdominal Pain - Female-A-AH ? ?

## 2021-11-16 ENCOUNTER — Telehealth: Payer: Self-pay

## 2021-11-16 ENCOUNTER — Other Ambulatory Visit: Payer: Self-pay

## 2021-11-16 DIAGNOSIS — R194 Change in bowel habit: Secondary | ICD-10-CM

## 2021-11-16 NOTE — Telephone Encounter (Signed)
Referral placed for GI. ? ?KP ?

## 2021-11-16 NOTE — Telephone Encounter (Signed)
Copied from [place] [id number]. Topic: Referral - Request for Referral ?>> Nov 16, 2021 10:17 AM Erick Blinks wrote: ?Has patient seen PCP for this complaint? Yes.   ?*If NO, is insurance requiring patient see PCP for this issue before PCP can refer them? ?Referral for which specialty: GI  ?Preferred provider/office: [facility] recommended in network in [place]  ?Reason for referral: Per last office visit, gastro issues. ?

## 2021-11-16 NOTE — Telephone Encounter (Signed)
Pt informed. She verbalized understanding. ? ?KP ?

## 2021-12-04 ENCOUNTER — Other Ambulatory Visit: Payer: Self-pay | Admitting: Internal Medicine

## 2021-12-04 DIAGNOSIS — E785 Hyperlipidemia, unspecified: Secondary | ICD-10-CM

## 2021-12-05 ENCOUNTER — Ambulatory Visit (INDEPENDENT_AMBULATORY_CARE_PROVIDER_SITE_OTHER): Payer: Medicare Other

## 2021-12-05 DIAGNOSIS — Z0000 Encounter for general adult medical examination without abnormal findings: Secondary | ICD-10-CM | POA: Diagnosis not present

## 2021-12-05 NOTE — Progress Notes (Signed)
? ?Subjective:  ? Crystal Zavala is a 88 y.o. female who presents for Medicare Annual (Subsequent) preventive examination. ? ?Virtual Visit via Telephone Note ? ?I connected with  Crystal Zavala on 12/05/21 at 10:00 AM EDT by telephone and verified that I am speaking with the correct person using two identifiers. ? ?Location: ?Patient: home ?Provider: [facility] ?Persons participating in the virtual visit: patient/Nurse Health Advisor ?  ?I discussed the limitations, risks, security and privacy concerns of performing an evaluation and management service by telephone and the availability of in person appointments. The patient expressed understanding and agreed to proceed. ? ?Interactive audio and video telecommunications were attempted between this nurse and patient, however failed, due to patient having technical difficulties OR patient did not have access to video capability.  We continued and completed visit with audio only. ? ?Some vital signs may be absent or patient reported.  ? ?Clemetine Marker, LPN ? ? ?Review of Systems    ? ?Cardiac Risk Factors include: advanced age (>29mn, >>73women);dyslipidemia;hypertension ? ?   ?Objective:  ?  ?There were no vitals filed for this visit. ?There is no height or weight on file to calculate BMI. ? ? ?  12/05/2021  ? 10:09 AM 12/04/2020  ? 10:21 AM 12/01/2019  ? 10:22 AM 11/30/2018  ?  1:39 PM 10/23/2017  ? 10:46 AM 10/21/2016  ?  9:42 AM 09/12/2015  ?  9:49 AM  ?Advanced Directives  ?Does Patient Have a Medical Advance Directive? Yes Yes Yes Yes Yes Yes No  ?Type of [place]aramed[place]of [place]Living will [place]Living will [place]Living will [place] PointLiving will [place]bb[place]Living will [place]Living will   ?Copy of [place] Chart? Yes - validated most recent copy scanned in chart (See row information) No - copy requested No - copy requested No -  copy requested No - copy requested No - copy requested   ?Would patient like information on creating a medical advance directive?       No - patient declined information  ? ? ?Current Medications (verified) ?Outpatient Encounter Medications as of 12/05/2021  ?Medication Sig  ? albuterol (VENTOLIN HFA) 108 (90 Base) MCG/ACT inhaler Inhale into the lungs every 6 (six) hours as needed for wheezing or shortness of breath.  ? cetirizine (ZYRTEC) 10 MG tablet Take 10 mg by mouth daily.  ? diltiazem (TIAZAC) 180 MG 24 hr capsule diltiazem CD 180 mg capsule,extended release 24 hr ? TAKE 1 CAPSULE (180 MG TOTAL) BY MOUTH ONCE DAILY  ? ELIQUIS 5 MG TABS tablet Take 5 mg by mouth 2 (two) times daily.  ? fluticasone-salmeterol (ADVAIR) 100-50 MCG/ACT AEPB Inhale 1 puff into the lungs 2 (two) times daily.  ? furosemide (LASIX) 20 MG tablet Take 2 tablets (40 mg total) by mouth daily. (Patient taking differently: Take 40 mg by mouth as needed.)  ? HYDROcodone-acetaminophen (NORCO/VICODIN) 5-325 MG tablet Take 1 tablet by mouth every 6 (six) hours as needed for moderate pain.  ? irbesartan (AVAPRO) 300 MG tablet SMARTSIG:1 By Mouth  ? levothyroxine (SYNTHROID) 88 MCG tablet TAKE 1 TABLET BY MOUTH DAILY BEFORE BREAKFAST MONDAY THROUGH SATURDAY AND TAKE 1/2 TAB ON SUNDAYS (Patient taking differently: daily.)  ? Magnesium Cl-Calcium Carbonate (SLOW-MAG PO) Take by mouth.  ? metoprolol succinate (TOPROL-XL) 50 MG 24 hr tablet Take 50 mg by mouth daily.  ? oxybutynin (DITROPAN-XL) 5 MG 24 hr tablet  Take 5 mg by mouth daily.  ? pantoprazole (PROTONIX) 40 MG tablet TAKE ONE TABLET BY MOUTH DAILY  ? rosuvastatin (CRESTOR) 5 MG tablet TAKE ONE TABLET BY MOUTH DAILY  ? traZODone (DESYREL) 150 MG tablet TAKE 1 TABLET BY MOUTH EVERY NIGHT AT BEDTIME AS NEEDED FOR SLEEP  ? Vitamin D, Cholecalciferol, 50 MCG (2000 UT) CAPS Take 2 capsules by mouth daily.  ? [DISCONTINUED] fluticasone-salmeterol (ADVAIR) 100-50 MCG/ACT AEPB Inhale 1 puff into the  lungs every 12 (twelve) hours. For 90 days  ? [DISCONTINUED] amoxicillin-clavulanate (AUGMENTIN) 875-125 MG tablet Take 1 tablet by mouth 2 (two) times daily.  ? [DISCONTINUED] Fluticasone-Salmeterol (ADVAIR DISKUS) 250-50 MCG/DOSE AEPB Inhale 1 puff into the lungs 2 (two) times daily.  ? [DISCONTINUED] nystatin cream (MYCOSTATIN) nystatin 100,000 unit/gram topical cream  ? [DISCONTINUED] potassium chloride 20 MEQ/15ML (10%) SOLN potassium chloride 20 mEq/15 mL oral liquid  ? ?No facility-administered encounter medications on file as of 12/05/2021.  ? ? ?Allergies (verified) ?Amlodipine, Tramadol, and Ace inhibitors  ? ?History: ?Past Medical History:  ?Diagnosis Date  ? Allergy to tramadol 09/03/2018  ? Asthma   ? Breast mass, right 01/10/2016  ? Seen on Mammogram 12/2015 - addi-views appeared benign - repeat 6 months Mammogram 01/2017 with [place] - no changed in right density over 2 years Return to annual screening  ? GERD (gastroesophageal reflux disease)   ? Hyperlipidemia   ? Hypertension   ? Osteoarthritis   ? in back  ? Scoliosis   ? Spondylosis   ? Thyroid disease   ? ?Past Surgical History:  ?Procedure Laterality Date  ? ABDOMINAL HYSTERECTOMY    ? BLEPHAROPLASTY Bilateral 2013  ? BREAST BIOPSY Left 2009  ? benign  ? COLONOSCOPY  2006  ? Normal  ? ESI  01/2017  ? L5-S1 @ [facility]  ? ESOPHAGOGASTRODUODENOSCOPY  2015  ? Dilation of esophageal stricture  ? TOTAL HIP ARTHROPLASTY Right 2019  ? ?Family History  ?Problem Relation Age of Onset  ? Tuberculosis Mother   ? Stroke Father   ? Bipolar disorder Daughter   ? Alzheimer's disease Brother   ? Heart attack Brother   ? Heart disease Brother   ? Bipolar disorder Sister   ? Diabetes Son   ? Heart attack Brother   ? Heart disease Brother   ? Heart attack Brother   ? Heart disease Brother   ? ?Social History  ? ?Socioeconomic History  ? Marital status: Widowed  ?  Spouse name: Not on file  ? Number of children: 2  ? Years of education: Not on file  ? Highest education level:  12th grade  ?Occupational History  ? Occupation: Retired  ?Tobacco Use  ? Smoking status: Former  ?  Packs/day: 0.50  ?  Years: 39.00  ?  Pack years: 19.50  ?  Types: Cigarettes  ?  Quit date: 13  ?  Years since quitting: 31.3  ? Smokeless tobacco: Never  ? Tobacco comments:  ?  smoking cessation materails not required  ?Vaping Use  ? Vaping Use: Never used  ?Substance and Sexual Activity  ? Alcohol use: No  ?  Alcohol/week: 0.0 standard drinks  ? Drug use: No  ? Sexual activity: Never  ?Other Topics Concern  ? Not on file  ?Social History Narrative  ? Pt lives alone  ? ?Social Determinants of Health  ? ?Financial Resource Strain: Low Risk   ? Difficulty of Paying Living Expenses: Not hard at all  ?Food  Insecurity: No Food Insecurity  ? Worried About Charity fundraiser in the Last Year: Never true  ? Ran Out of Food in the Last Year: Never true  ?Transportation Needs: No Transportation Needs  ? Lack of Transportation (Medical): No  ? Lack of Transportation (Non-Medical): No  ?Physical Activity: Insufficiently Active  ? Days of Exercise per Week: 7 days  ? Minutes of Exercise per Session: 10 min  ?Stress: No Stress Concern Present  ? Feeling of Stress : Only a little  ?Social Connections: Moderately Isolated  ? Frequency of Communication with Friends and Family: More than three times a week  ? Frequency of Social Gatherings with Friends and Family: More than three times a week  ? Attends Religious Services: More than 4 times per year  ? Active Member of Clubs or Organizations: No  ? Attends Archivist Meetings: Never  ? Marital Status: Widowed  ? ? ?Tobacco Counseling ?Counseling given: Not Answered ?Tobacco comments: smoking cessation materails not required ? ? ?Clinical Intake: ? ?Pre-visit preparation completed: Yes ? ?Pain : No/denies pain ? ?  ? ?Nutritional Risks: None ?Diabetes: No ? ?How often do you need to have someone help you when you read instructions, pamphlets, or other written materials  from your doctor or pharmacy?: 1 - Never ? ? ? ?Interpreter Needed?: No ? ?Information entered by :: Clemetine Marker LPN ? ? ?Activities of Daily Living ? ?  12/05/2021  ? 10:10 AM 11/02/2021  ?  1:28 PM  ?In you

## 2021-12-05 NOTE — Patient Instructions (Signed)
Crystal Zavala , ?Thank you for taking time to come for your Medicare Wellness Visit. I appreciate your ongoing commitment to your health goals. Please review the following plan we discussed and let me know if I can assist you in the future.  ? ?Screening recommendations/referrals: ?Colonoscopy: no longer required ?Mammogram: no longer required ?Bone Density: no longer required ?Recommended yearly ophthalmology/optometry visit for glaucoma screening and checkup ?Recommended yearly dental visit for hygiene and checkup ? ?Vaccinations: ?Influenza vaccine: done 05/22/21 ?Pneumococcal vaccine: done 09/13/14 ?Tdap vaccine: due ?Shingles vaccine: Shingrix discussed. Please contact your pharmacy for coverage information.  ?Covid-19:done 08/28/19 & 09/19/19 ? ?Advanced directives: Please bring a copy of your health care power of attorney and living will to the office at your convenience.  ? ?Conditions/risks identified: Keep up the great work! ? ?Next appointment: Follow up in one year for your annual wellness visit  ? ? ?Preventive Care 86 Years and Older, Female ?Preventive care refers to lifestyle choices and visits with your health care provider that can promote health and wellness. ?What does preventive care include? ?A yearly physical exam. This is also called an annual well check. ?Dental exams once or twice a year. ?Routine eye exams. Ask your health care provider how often you should have your eyes checked. ?Personal lifestyle choices, including: ?Daily care of your teeth and gums. ?Regular physical activity. ?Eating a healthy diet. ?Avoiding tobacco and drug use. ?Limiting alcohol use. ?Practicing safe sex. ?Taking low-dose aspirin every day. ?Taking vitamin and mineral supplements as recommended by your health care provider. ?What happens during an annual well check? ?The services and screenings done by your health care provider during your annual well check will depend on your age, overall health, lifestyle risk  factors, and family history of disease. ?Counseling  ?Your health care provider may ask you questions about your: ?Alcohol use. ?Tobacco use. ?Drug use. ?Emotional well-being. ?Home and relationship well-being. ?Sexual activity. ?Eating habits. ?History of falls. ?Memory and ability to understand (cognition). ?Work and work Statistician. ?Reproductive health. ?Screening  ?You may have the following tests or measurements: ?Height, weight, and BMI. ?Blood pressure. ?Lipid and cholesterol levels. These may be checked every 5 years, or more frequently if you are over 86 years old. ?Skin check. ?Lung cancer screening. You may have this screening every year starting at age 86 if you have a 30-pack-year history of smoking and currently smoke or have quit within the past 15 years. ?Fecal occult blood test (FOBT) of the stool. You may have this test every year starting at age 86. ?Flexible sigmoidoscopy or colonoscopy. You may have a sigmoidoscopy every 5 years or a colonoscopy every 10 years starting at age 86. ?Hepatitis C blood test. ?Hepatitis B blood test. ?Sexually transmitted disease (STD) testing. ?Diabetes screening. This is done by checking your blood sugar (glucose) after you have not eaten for a while (fasting). You may have this done every 1-3 years. ?Bone density scan. This is done to screen for osteoporosis. You may have this done starting at age 86. ?Mammogram. This may be done every 1-2 years. Talk to your health care provider about how often you should have regular mammograms. ?Talk with your health care provider about your test results, treatment options, and if necessary, the need for more tests. ?Vaccines  ?Your health care provider may recommend certain vaccines, such as: ?Influenza vaccine. This is recommended every year. ?Tetanus, diphtheria, and acellular pertussis (Tdap, Td) vaccine. You may need a Td booster every 10 years. ?Zoster vaccine.  You may need this after age 86. ?Pneumococcal 13-valent  conjugate (PCV13) vaccine. One dose is recommended after age 86. ?Pneumococcal polysaccharide (PPSV23) vaccine. One dose is recommended after age 86. ?Talk to your health care provider about which screenings and vaccines you need and how often you need them. ?This information is not intended to replace advice given to you by your health care provider. Make sure you discuss any questions you have with your health care provider. ?Document Released: 08/18/2015 Document Revised: 04/10/2016 Document Reviewed: 05/23/2015 ?Elsevier Interactive Patient Education ? 2017 [place]. ? ?Fall Prevention in the Home ?Falls can cause injuries. They can happen to people of all ages. There are many things you can do to make your home safe and to help prevent falls. ?What can I do on the outside of my home? ?Regularly fix the edges of walkways and driveways and fix any cracks. ?Remove anything that might make you trip as you walk through a door, such as a raised step or threshold. ?Trim any bushes or trees on the path to your home. ?Use bright outdoor lighting. ?Clear any walking paths of anything that might make someone trip, such as rocks or tools. ?Regularly check to see if handrails are loose or broken. Make sure that both sides of any steps have handrails. ?Any raised decks and porches should have guardrails on the edges. ?Have any leaves, snow, or ice cleared regularly. ?Use sand or salt on walking paths during winter. ?Clean up any spills in your garage right away. This includes oil or grease spills. ?What can I do in the bathroom? ?Use night lights. ?Install grab bars by the toilet and in the tub and shower. Do not use towel bars as grab bars. ?Use non-skid mats or decals in the tub or shower. ?If you need to sit down in the shower, use a plastic, non-slip stool. ?Keep the floor dry. Clean up any water that spills on the floor as soon as it happens. ?Remove soap buildup in the tub or shower regularly. ?Attach bath mats  securely with double-sided non-slip rug tape. ?Do not have throw rugs and other things on the floor that can make you trip. ?What can I do in the bedroom? ?Use night lights. ?Make sure that you have a light by your bed that is easy to reach. ?Do not use any sheets or blankets that are too big for your bed. They should not hang down onto the floor. ?Have a firm chair that has side arms. You can use this for support while you get dressed. ?Do not have throw rugs and other things on the floor that can make you trip. ?What can I do in the kitchen? ?Clean up any spills right away. ?Avoid walking on wet floors. ?Keep items that you use a lot in easy-to-reach places. ?If you need to reach something above you, use a strong step stool that has a grab bar. ?Keep electrical cords out of the way. ?Do not use floor polish or wax that makes floors slippery. If you must use wax, use non-skid floor wax. ?Do not have throw rugs and other things on the floor that can make you trip. ?What can I do with my stairs? ?Do not leave any items on the stairs. ?Make sure that there are handrails on both sides of the stairs and use them. Fix handrails that are broken or loose. Make sure that handrails are as long as the stairways. ?Check any carpeting to make sure that it is  firmly attached to the stairs. Fix any carpet that is loose or worn. ?Avoid having throw rugs at the top or bottom of the stairs. If you do have throw rugs, attach them to the floor with carpet tape. ?Make sure that you have a light switch at the top of the stairs and the bottom of the stairs. If you do not have them, ask someone to add them for you. ?What else can I do to help prevent falls? ?Wear shoes that: ?Do not have high heels. ?Have rubber bottoms. ?Are comfortable and fit you well. ?Are closed at the toe. Do not wear sandals. ?If you use a stepladder: ?Make sure that it is fully opened. Do not climb a closed stepladder. ?Make sure that both sides of the stepladder  are locked into place. ?Ask someone to hold it for you, if possible. ?Clearly mark and make sure that you can see: ?Any grab bars or handrails. ?First and last steps. ?Where the edge of each step is. ?Use tools th

## 2021-12-25 ENCOUNTER — Encounter: Payer: Self-pay | Admitting: Internal Medicine

## 2021-12-25 ENCOUNTER — Ambulatory Visit (INDEPENDENT_AMBULATORY_CARE_PROVIDER_SITE_OTHER): Payer: Medicare Other | Admitting: Internal Medicine

## 2021-12-25 VITALS — BP 124/70 | HR 76 | Ht 62.0 in | Wt 141.0 lb

## 2021-12-25 DIAGNOSIS — I480 Paroxysmal atrial fibrillation: Secondary | ICD-10-CM | POA: Diagnosis not present

## 2021-12-25 DIAGNOSIS — J4520 Mild intermittent asthma, uncomplicated: Secondary | ICD-10-CM | POA: Diagnosis not present

## 2021-12-25 DIAGNOSIS — E034 Atrophy of thyroid (acquired): Secondary | ICD-10-CM

## 2021-12-25 DIAGNOSIS — E785 Hyperlipidemia, unspecified: Secondary | ICD-10-CM

## 2021-12-25 DIAGNOSIS — K2100 Gastro-esophageal reflux disease with esophagitis, without bleeding: Secondary | ICD-10-CM | POA: Diagnosis not present

## 2021-12-25 DIAGNOSIS — I10 Essential (primary) hypertension: Secondary | ICD-10-CM | POA: Diagnosis not present

## 2021-12-25 DIAGNOSIS — D6869 Other thrombophilia: Secondary | ICD-10-CM

## 2021-12-25 DIAGNOSIS — N289 Disorder of kidney and ureter, unspecified: Secondary | ICD-10-CM

## 2021-12-25 NOTE — Progress Notes (Signed)
Date:  12/25/2021   Name:  Crystal Zavala   DOB:  [birth year]   MRN:  [id number]   Chief Complaint: Annual Exam  Crystal Zavala is a 88 y.o. female who presents today for her Complete Annual Exam. She feels poorly. She reports walking some. She reports she is sleeping well. Breast complaints - small lump on upper left breast.  Mammogram: 10/2019 DEXA: 04/2001 Pap smear: discontinued Colonoscopy: aged out  Health Maintenance Due  Topic Date Due   Zoster Vaccines- Shingrix (1 of 2) Never done   MAMMOGRAM  10/24/2020    Immunization History  Administered Date(s) Administered   Fluad Quad(high Dose 65+) 03/31/2019, 05/10/2020, 05/22/2021   Influenza, High Dose Seasonal PF 04/22/2017   Influenza,inj,Quad PF,6+ Mos 04/22/2016   Influenza-Unspecified 06/05/2018   PFIZER(Purple Top)SARS-COV-2 Vaccination 08/28/2019, 09/19/2019   Pneumococcal Conjugate-13 09/13/2014   Pneumococcal Polysaccharide-23 10/03/2004   Tdap 08/27/2011, 12/12/2021    Hypertension This is a chronic problem. The problem is controlled. Pertinent negatives include no chest pain, headaches, palpitations or shortness of breath. Identifiable causes of hypertension include a thyroid problem.  Hyperlipidemia This is a chronic problem. The problem is controlled. Pertinent negatives include no chest pain or shortness of breath. Current antihyperlipidemic treatment includes statins.  Gastroesophageal Reflux She complains of dysphagia and heartburn. She reports no abdominal pain, no chest pain, no coughing or no wheezing. Pertinent negatives include no fatigue.  Thyroid Problem Presents for follow-up visit. Symptoms include constipation. Patient reports no anxiety, diarrhea, fatigue, palpitations or tremors. The symptoms have been stable. Her past medical history is significant for hyperlipidemia.   Lab Results  Component Value Date   NA 141 11/02/2021   K 5.0 11/02/2021   CO2 23 11/02/2021   GLUCOSE 116 (H) 11/02/2021    BUN 37 (H) 11/02/2021   CREATININE 1.05 (H) 11/02/2021   CALCIUM 10.1 11/02/2021   EGFR 51 (L) 11/02/2021   GFRNONAA 55 04/23/2021   Lab Results  Component Value Date   CHOL 168 12/25/2020   HDL 81 12/25/2020   LDLCALC 72 12/25/2020   TRIG 79 12/25/2020   CHOLHDL 2.1 12/25/2020   Lab Results  Component Value Date   TSH 0.55 04/23/2021   Lab Results  Component Value Date   HGBA1C 5.2 04/22/2017   Lab Results  Component Value Date   WBC 11.5 (H) 11/02/2021   HGB 12.4 11/02/2021   HCT 39.1 11/02/2021   MCV 93 11/02/2021   PLT 351 11/02/2021   Lab Results  Component Value Date   ALT 8 11/02/2021   AST 13 11/02/2021   ALKPHOS 116 11/02/2021   BILITOT 0.4 11/02/2021   No results found for: 25OHVITD2, 25OHVITD3, VD25OH   Review of Systems  Constitutional:  Negative for chills, fatigue and fever.  HENT:  Negative for congestion, hearing loss, tinnitus, trouble swallowing and voice change.   Eyes:  Negative for visual disturbance.  Respiratory:  Negative for cough, chest tightness, shortness of breath and wheezing.   Cardiovascular:  Negative for chest pain, palpitations and leg swelling.  Gastrointestinal:  Positive for constipation, dysphagia and heartburn. Negative for abdominal pain, diarrhea and vomiting.  Endocrine: Negative for polydipsia and polyuria.  Genitourinary:  Negative for dysuria, frequency, genital sores, vaginal bleeding and vaginal discharge.  Musculoskeletal:  Negative for arthralgias, gait problem and joint swelling.  Skin:  Negative for color change and rash.  Neurological:  Negative for dizziness, tremors, light-headedness and headaches.  Hematological:  Negative for adenopathy. Does  not bruise/bleed easily.  Psychiatric/Behavioral:  Negative for dysphoric mood and sleep disturbance. The patient is not nervous/anxious.    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   Spondylosis of lumbar region without myelopathy or radiculopathy 10/21/2016   Hypothyroidism due to acquired atrophy of thyroid 04/22/2016   Insomnia 04/22/2016   Aortic atherosclerosis ([place]) 01/18/2015   Arthritis of neck 01/18/2015   Dermatitis, eczematoid 01/18/2015   Essential (primary) hypertension 01/18/2015   Esophagitis, reflux 01/18/2015   H/O adenomatous polyp of colon 01/18/2015   Migraine without aura and responsive to treatment 01/18/2015   Asthma, mild intermittent 01/18/2015   Hyperlipidemia, mild 01/18/2015   Lung nodule, multiple 01/18/2015   Kidney cysts 01/18/2015   Tobacco use disorder, moderate, in sustained remission 01/18/2015   Temporary cerebral vascular dysfunction 01/18/2015   Dysphagia 10/26/2013    Allergies  Allergen Reactions   Amlodipine Swelling   Tramadol Nausea Only and Rash   Ace Inhibitors Cough    Past Surgical History:  Procedure Laterality Date   ABDOMINAL HYSTERECTOMY     BLEPHAROPLASTY Bilateral 2013   BREAST BIOPSY Left 2009   benign   COLONOSCOPY  2006   Normal   ESI  01/2017   L5-S1 @ [facility]   ESOPHAGOGASTRODUODENOSCOPY  2015   Dilation of esophageal stricture   TOTAL HIP ARTHROPLASTY Right 2019    Social History   Tobacco Use   Smoking status: Former    Packs/day: 0.50    Years: 39.00    Pack years: 19.50    Types: Cigarettes    Quit date: 1992    Years since quitting: 31.4   Smokeless tobacco: Never   Tobacco comments:    smoking cessation materails not required  Vaping Use   Vaping Use: Never used  Substance Use Topics   Alcohol use: No    Alcohol/week: 0.0 standard drinks   Drug use: No     Medication list has been reviewed and  updated.  Current Meds  Medication Sig   albuterol (VENTOLIN HFA) 108 (90 Base) MCG/ACT inhaler Inhale into the lungs every 6 (six) hours as needed for wheezing or shortness of breath.   cetirizine (ZYRTEC) 10 MG tablet Take 10 mg by mouth daily.   diltiazem (TIAZAC) 180 MG 24 hr capsule diltiazem CD 180 mg capsule,extended release 24 hr  TAKE 1 CAPSULE (180 MG TOTAL) BY MOUTH ONCE DAILY   ELIQUIS 5 MG TABS tablet Take 5 mg by mouth 2 (two) times daily.   fluticasone-salmeterol (ADVAIR) 100-50 MCG/ACT AEPB Inhale 1 puff into the lungs 2 (two) times daily.   furosemide (LASIX) 20 MG tablet Take 2 tablets (40 mg total) by mouth daily. (Patient taking differently: Take 40 mg by mouth as needed.)   HYDROcodone-acetaminophen (NORCO/VICODIN) 5-325 MG tablet Take 1 tablet by mouth every 6 (six) hours as needed for moderate pain.   irbesartan (AVAPRO) 300 MG tablet SMARTSIG:1 By Mouth   levothyroxine (SYNTHROID) 88 MCG tablet TAKE 1 TABLET BY MOUTH DAILY BEFORE BREAKFAST MONDAY THROUGH SATURDAY AND TAKE 1/2 TAB ON SUNDAYS (Patient taking differently: daily.)   Magnesium Cl-Calcium Carbonate (SLOW-MAG PO) Take by mouth.   metoprolol succinate (TOPROL-XL) 50 MG  24 hr tablet Take 50 mg by mouth daily.   oxybutynin (DITROPAN-XL) 5 MG 24 hr tablet Take 5 mg by mouth daily.   pantoprazole (PROTONIX) 40 MG tablet TAKE ONE TABLET BY MOUTH DAILY   rosuvastatin (CRESTOR) 5 MG tablet TAKE ONE TABLET BY MOUTH DAILY   traZODone (DESYREL) 150 MG tablet TAKE 1 TABLET BY MOUTH EVERY NIGHT AT BEDTIME AS NEEDED FOR SLEEP   Vitamin D, Cholecalciferol, 50 MCG (2000 UT) CAPS Take 2 capsules by mouth daily.       12/25/2021   10:08 AM 11/02/2021    1:27 PM 08/10/2021   10:50 AM 05/22/2021    9:26 AM  GAD 7 : Generalized Anxiety Score  Nervous, Anxious, on Edge 0 1 1 0  Control/stop worrying 0 0 0 0  Worry too much - different things 1 0 0 1  Trouble relaxing 0 0 0 0  Restless 0 0 0   Easily annoyed or irritable 0 0  0 0  Afraid - awful might happen 0 0 0 0  Total GAD 7 Score 1 1 1    Anxiety Difficulty Not difficult at all  Not difficult at all Not difficult at all       12/25/2021   10:08 AM  Depression screen PHQ 2/9  Decreased Interest 0  Down, Depressed, Hopeless 0  PHQ - 2 Score 0  Altered sleeping 0  Tired, decreased energy 1  Change in appetite 1  Feeling bad or failure about yourself  0  Trouble concentrating 1  Moving slowly or fidgety/restless 0  Suicidal thoughts 0  PHQ-9 Score 3  Difficult doing work/chores Not difficult at all    BP Readings from Last 3 Encounters:  12/25/21 124/70  11/02/21 138/82  08/10/21 138/78    Physical Exam Vitals and nursing note reviewed.  Constitutional:      General: She is not in acute distress.    Appearance: She is well-developed.  HENT:     Head: Normocephalic and atraumatic.     Right Ear: Tympanic membrane and ear canal normal.     Left Ear: Tympanic membrane and ear canal normal.     Nose:     Right Sinus: No maxillary sinus tenderness.     Left Sinus: No maxillary sinus tenderness.  Eyes:     General: No scleral icterus.       Right eye: No discharge.        Left eye: No discharge.     Conjunctiva/sclera: Conjunctivae normal.  Neck:     Thyroid: No thyromegaly.     Vascular: No carotid bruit.  Cardiovascular:     Rate and Rhythm: Normal rate and regular rhythm.     Pulses: Normal pulses.     Heart sounds: Normal heart sounds.  Pulmonary:     Effort: Pulmonary effort is normal. No respiratory distress.     Breath sounds: No wheezing.  Chest:  Breasts:    Right: No mass, nipple discharge, skin change or tenderness.     Left: No mass, nipple discharge, skin change or tenderness.  Abdominal:     General: Bowel sounds are normal.     Palpations: Abdomen is soft.     Tenderness: There is no abdominal tenderness. There is no guarding or rebound.     Comments: Diastasis recti  Musculoskeletal:     Cervical back: Normal  range of motion. No erythema.     Right lower leg: No edema.  Left lower leg: No edema.  Lymphadenopathy:     Cervical: No cervical adenopathy.  Skin:    General: Skin is warm and dry.     Capillary Refill: Capillary refill takes less than 2 seconds.     Findings: No rash.  Neurological:     General: No focal deficit present.     Mental Status: She is alert and oriented to person, place, and time.     Cranial Nerves: No cranial nerve deficit.     Sensory: No sensory deficit.     Deep Tendon Reflexes: Reflexes are normal and symmetric.  Psychiatric:        Attention and Perception: Attention normal.        Mood and Affect: Mood normal.    Wt Readings from Last 3 Encounters:  12/25/21 141 lb (64 kg)  11/02/21 142 lb (64.4 kg)  08/10/21 143 lb 12.8 oz (65.2 kg)    BP 124/70   Pulse 76   Ht 5' 2"  (1.575 m)   Wt 141 lb (64 kg)   SpO2 97%   BMI 25.79 kg/m   Assessment and Plan: 1. Essential (primary) hypertension Clinically stable exam with well controlled BP. Tolerating medications without side effects at this time. Pt to continue current regimen and low sodium diet; benefits of regular exercise as able discussed. - CBC with Differential/Platelet - Comprehensive metabolic panel  2. Paroxysmal atrial fibrillation ([place]) Followed by Cardiology Doing well on medications  3. Mild intermittent asthma without complication Stable without recent exacerbation  4. Gastroesophageal reflux disease with esophagitis, unspecified whether hemorrhage Symptoms well controlled on daily PPI No red flag signs such as weight loss, n/v, melena Will continue Protonix.  5. Hypothyroidism due to acquired atrophy of thyroid supplemented - TSH + free T4  6. Renal insufficiency Continue to monitor  7. Hyperlipidemia, mild On Crestor without side effects - Lipid panel  8. Acquired thrombophilia ([place]) On Eliquis without bleeding issues She has thinning skin and is prone to small  tears but these heal easily.   Partially dictated using Editor, commissioning. Any errors are unintentional.  Halina Maidens, MD [facility]  12/25/2021

## 2021-12-26 LAB — CBC WITH DIFFERENTIAL/PLATELET
Basophils Absolute: 0.1 10*3/uL (ref 0.0–0.2)
Basos: 1 %
EOS (ABSOLUTE): 0.1 10*3/uL (ref 0.0–0.4)
Eos: 1 %
Hematocrit: 38.2 % (ref 34.0–46.6)
Hemoglobin: 12.3 g/dL (ref 11.1–15.9)
Immature Grans (Abs): 0.1 10*3/uL (ref 0.0–0.1)
Immature Granulocytes: 1 %
Lymphocytes Absolute: 1.4 10*3/uL (ref 0.7–3.1)
Lymphs: 15 %
MCH: 29.6 pg (ref 26.6–33.0)
MCHC: 32.2 g/dL (ref 31.5–35.7)
MCV: 92 fL (ref 79–97)
Monocytes Absolute: 0.7 10*3/uL (ref 0.1–0.9)
Monocytes: 8 %
Neutrophils Absolute: 6.6 10*3/uL (ref 1.4–7.0)
Neutrophils: 74 %
Platelets: 360 10*3/uL (ref 150–450)
RBC: 4.15 x10E6/uL (ref 3.77–5.28)
RDW: 13.2 % (ref 11.7–15.4)
WBC: 8.9 10*3/uL (ref 3.4–10.8)

## 2021-12-26 LAB — COMPREHENSIVE METABOLIC PANEL
ALT: 13 IU/L (ref 0–32)
AST: 18 IU/L (ref 0–40)
Albumin/Globulin Ratio: 1.4 (ref 1.2–2.2)
Albumin: 3.9 g/dL (ref 3.6–4.6)
Alkaline Phosphatase: 100 IU/L (ref 44–121)
BUN/Creatinine Ratio: 32 — ABNORMAL HIGH (ref 12–28)
BUN: 29 mg/dL — ABNORMAL HIGH (ref 8–27)
Bilirubin Total: 0.5 mg/dL (ref 0.0–1.2)
CO2: 23 mmol/L (ref 20–29)
Calcium: 10.4 mg/dL — ABNORMAL HIGH (ref 8.7–10.3)
Chloride: 103 mmol/L (ref 96–106)
Creatinine, Ser: 0.91 mg/dL (ref 0.57–1.00)
Globulin, Total: 2.7 g/dL (ref 1.5–4.5)
Glucose: 98 mg/dL (ref 70–99)
Potassium: 4.8 mmol/L (ref 3.5–5.2)
Sodium: 139 mmol/L (ref 134–144)
Total Protein: 6.6 g/dL (ref 6.0–8.5)
eGFR: 61 mL/min/{1.73_m2} (ref >59)

## 2021-12-26 LAB — TSH+FREE T4
Free T4: 2.01 ng/dL — ABNORMAL HIGH (ref 0.82–1.77)
TSH: 1.42 u[IU]/mL (ref 0.450–4.500)

## 2021-12-26 LAB — LIPID PANEL
Chol/HDL Ratio: 2.3 ratio (ref 0.0–4.4)
Cholesterol, Total: 157 mg/dL (ref 100–199)
HDL: 69 mg/dL (ref >39)
LDL Chol Calc (NIH): 73 mg/dL (ref 0–99)
Triglycerides: 81 mg/dL (ref 0–149)
VLDL Cholesterol Cal: 15 mg/dL (ref 5–40)

## 2021-12-26 NOTE — Addendum Note (Signed)
Addended by: Glean Hess on: 12/26/2021 02:11 PM   Modules accepted: Level of Service

## 2021-12-28 ENCOUNTER — Encounter: Payer: Medicare Other | Admitting: Internal Medicine

## 2022-01-18 ENCOUNTER — Inpatient Hospital Stay: Payer: Medicare Other | Admitting: Internal Medicine

## 2022-01-22 ENCOUNTER — Inpatient Hospital Stay: Payer: Medicare Other | Admitting: Internal Medicine

## 2022-01-29 ENCOUNTER — Ambulatory Visit (INDEPENDENT_AMBULATORY_CARE_PROVIDER_SITE_OTHER): Payer: Medicare Other | Admitting: Internal Medicine

## 2022-01-29 ENCOUNTER — Encounter: Payer: Self-pay | Admitting: Internal Medicine

## 2022-01-29 VITALS — BP 124/70 | HR 77 | Ht 62.0 in | Wt 139.0 lb

## 2022-01-29 DIAGNOSIS — N289 Disorder of kidney and ureter, unspecified: Secondary | ICD-10-CM | POA: Diagnosis not present

## 2022-01-29 DIAGNOSIS — K529 Noninfective gastroenteritis and colitis, unspecified: Secondary | ICD-10-CM | POA: Diagnosis not present

## 2022-01-29 DIAGNOSIS — R1310 Dysphagia, unspecified: Secondary | ICD-10-CM

## 2022-01-29 DIAGNOSIS — I10 Essential (primary) hypertension: Secondary | ICD-10-CM | POA: Diagnosis not present

## 2022-01-29 MED ORDER — DICYCLOMINE HCL 10 MG PO CAPS: 10.0000 mg | ORAL_CAPSULE | Freq: Three times a day (TID) | ORAL | 0 refills | Status: DC

## 2022-01-30 LAB — CBC WITH DIFFERENTIAL/PLATELET
Basophils Absolute: 0.1 10*3/uL (ref 0.0–0.2)
Basos: 1 %
EOS (ABSOLUTE): 0.1 10*3/uL (ref 0.0–0.4)
Eos: 1 %
Hematocrit: 36.3 % (ref 34.0–46.6)
Hemoglobin: 11.2 g/dL (ref 11.1–15.9)
Immature Grans (Abs): 0 10*3/uL (ref 0.0–0.1)
Immature Granulocytes: 0 %
Lymphocytes Absolute: 1.3 10*3/uL (ref 0.7–3.1)
Lymphs: 17 %
MCH: 28.7 pg (ref 26.6–33.0)
MCHC: 30.9 g/dL — ABNORMAL LOW (ref 31.5–35.7)
MCV: 93 fL (ref 79–97)
Monocytes Absolute: 0.7 10*3/uL (ref 0.1–0.9)
Monocytes: 9 %
Neutrophils Absolute: 5.5 10*3/uL (ref 1.4–7.0)
Neutrophils: 72 %
Platelets: 351 10*3/uL (ref 150–450)
RBC: 3.9 x10E6/uL (ref 3.77–5.28)
RDW: 13.1 % (ref 11.7–15.4)
WBC: 7.7 10*3/uL (ref 3.4–10.8)

## 2022-02-01 ENCOUNTER — Ambulatory Visit: Payer: Self-pay | Admitting: *Deleted

## 2022-02-01 NOTE — Telephone Encounter (Signed)
Summary: Has not had a bowel movement since Tuesday.   Pt stated has not had a bowel movement since Tuesday. is concerned and feels discomfort but no abdominal pain.   Pt mentioned she was supposed to bring back a specimen but has not been able to because she hasn't had a bowel movement.    Pt seeking clinical advice.        Chief Complaint: constipation unable to get stool sample Symptoms: no BM x 3 days. After starting dicyclomine patient had incontinent episode after breakfast Monday or Tuesday and has not had BM since.  Frequency: 3 days  Pertinent Negatives: Patient denies abdominal pain , no bloating, no pain in rectum.  Disposition: '[]'$ ED /'[]'$ Urgent Care (no appt availability in office) / '[]'$ Appointment(In office/virtual)/ '[]'$  [place] [facility]/ '[]'$ Home Care/ '[]'$ Refused Recommended Disposition /'[]'$ [place] [facility]/ '[x]'$  Follow-up with PCP Additional Notes:   Patient would like to know if she can take any OTC medication while taking dicyclomine. Concerned she is not able to collect stool sample as PCP requested. Pt takes hydrocodone for pain. Please advise. Last seen in [place] 01/29/22. Please advise if another OV necessary      Reason for Disposition  MILD constipation  Answer Assessment - Initial Assessment Questions 1. STOOL PATTERN OR FREQUENCY: "How often do you have a bowel movement (BM)?"  (Normal range: 3 times a day to every 3 days)  "When was your last BM?"       Unsure of normal pattern due to dx colitis and treated for IBS 2. STRAINING: "Do you have to strain to have a BM?"      At times  3. RECTAL PAIN: "Does your rectum hurt when the stool comes out?" If Yes, ask: "Do you have hemorrhoids? How bad is the pain?"  (Scale 1-10; or mild, moderate, severe)     Na  4. STOOL COMPOSITION: "Are the stools hard?"      No BM in 3 days  5. BLOOD ON STOOLS: "Has there been any blood on the toilet tissue or on the surface of the BM?" If Yes, ask: "When was the last time?"       Monday small amount of blood on tissue  6. CHRONIC CONSTIPATION: "Is this a new problem for you?"  If no, ask: "How long have you had this problem?" (days, weeks, months)      3 days  7. CHANGES IN DIET OR HYDRATION: "Have there been any recent changes in your diet?" "How much fluids are you drinking on a daily basis?"  "How much have you had to drink today?"     Taking new medication for IBS 8. MEDICATIONS: "Have you been taking any new medications?" "Are you taking any narcotic pain medications?" (e.g., Vicodin, Percocet, morphine, Dilaudid)     Hydrocodone  9. LAXATIVES: "Have you been using any stool softeners, laxatives, or enemas?"  If yes, ask "What, how often, and when was the last time?"     na 10. ACTIVITY:  "How much walking do you do every day?"  "Has your activity level decreased in the past week?"        Activity level same  11. CAUSE: "What do you think is causing the constipation?"        IBS/ dx colitis  12. OTHER SYMPTOMS: "Do you have any other symptoms?" (e.g., abdominal pain, bloating, fever, vomiting)       No  13. MEDICAL HISTORY: "Do you have a history of hemorrhoids,  rectal fissures, or rectal surgery or rectal abscess?"         na 14. PREGNANCY: "Is there any chance you are pregnant?" "When was your last menstrual period?"       na  Protocols used: Constipation-A-AH

## 2022-02-01 NOTE — Telephone Encounter (Signed)
Called pt let her know that she can take a OTC stool softener. Pt verbalized understanding.  KP

## 2022-02-25 ENCOUNTER — Other Ambulatory Visit: Payer: Self-pay | Admitting: Internal Medicine

## 2022-02-25 NOTE — Telephone Encounter (Signed)
Pt called in to request a refill for levothyroxine (SYNTHROID) 88 MCG tablet pt says that she reached out to her pharmacy and was told that they are unable to send request and advised pt to contact her PCP directly for refill..  Future appt: 2024  Pharmacy:  [id number] - [address], [place] Phone:  [phone]  Fax:  [phone]

## 2022-02-27 ENCOUNTER — Ambulatory Visit: Payer: Self-pay

## 2022-02-27 MED ORDER — LEVOTHYROXINE SODIUM 88 MCG PO TABS: 88.0000 ug | ORAL_TABLET | Freq: Every day | ORAL | 0 refills | Status: DC

## 2022-02-27 NOTE — Telephone Encounter (Signed)
Requested Prescriptions  Pending Prescriptions Disp Refills  . levothyroxine (SYNTHROID) 88 MCG tablet 90 tablet 0    Sig: Take 1 tablet (88 mcg total) by mouth daily.     Endocrinology:  Hypothyroid Agents Passed - 02/25/2022  4:00 PM      Passed - TSH in normal range and within 360 days    TSH  Date Value Ref Range Status  12/25/2021 1.420 0.450 - 4.500 uIU/mL Final         Passed - Valid encounter within last 12 months    Recent Outpatient Visits          4 weeks ago Acute colitis   [facility] Glean Hess, MD   2 months ago Essential (primary) hypertension   [facility] Glean Hess, MD   3 months ago Change in bowel habits   [facility] Glean Hess, MD   6 months ago Drug-induced constipation   [facility] Glean Hess, MD   9 months ago Localized edema   [facility] Glean Hess, MD

## 2022-02-27 NOTE — Telephone Encounter (Signed)
Summary: Pt has been without levothyroxine (SYNTHROID) 88 MCG tablet since this past Sunday   Pt requests call back asap regarding Rx refill request for levothyroxine (SYNTHROID) 88 MCG tablet because she has been without it since Sunday of this week. Pt stated she really needs a call back because she knows what happens when she goes without it. Cb# [phone]     Medication refilled. Answer Assessment - Initial Assessment Questions 1. DRUG NAME: "What medicine do you need to have refilled?"     Synthroid 2. REFILLS REMAINING: "How many refills are remaining?" (Note: The label on the medicine or pill bottle will show how many refills are remaining. If there are no refills remaining, then a renewal may be needed.)     0 3. EXPIRATION DATE: "What is the expiration date?" (Note: The label states when the prescription will expire, and thus can no longer be refilled.)     N/a 4. PRESCRIBING HCP: "Who prescribed it?" Reason: If prescribed by specialist, call should be referred to that group.     Dr. Army Melia 5. SYMPTOMS: "Do you have any symptoms?"     No 6. PREGNANCY: "Is there any chance that you are pregnant?" "When was your last menstrual period?"     No  Protocols used: Medication Refill and Renewal Call-A-AH

## 2022-03-19 ENCOUNTER — Ambulatory Visit: Payer: Medicare Other | Admitting: Internal Medicine

## 2022-03-21 ENCOUNTER — Encounter: Payer: Self-pay | Admitting: Internal Medicine

## 2022-03-21 ENCOUNTER — Ambulatory Visit (INDEPENDENT_AMBULATORY_CARE_PROVIDER_SITE_OTHER): Payer: Medicare Other | Admitting: Internal Medicine

## 2022-03-21 VITALS — BP 122/78 | HR 74 | Ht 62.0 in | Wt 140.0 lb

## 2022-03-21 DIAGNOSIS — R1030 Lower abdominal pain, unspecified: Secondary | ICD-10-CM | POA: Diagnosis not present

## 2022-03-21 DIAGNOSIS — K529 Noninfective gastroenteritis and colitis, unspecified: Secondary | ICD-10-CM

## 2022-03-21 MED ORDER — AMOXICILLIN-POT CLAVULANATE 875-125 MG PO TABS: 1.0000 | ORAL_TABLET | Freq: Two times a day (BID) | ORAL | 0 refills | Status: AC

## 2022-03-21 NOTE — Progress Notes (Signed)
  Date:  03/21/2022   Name:  Crystal Zavala   DOB:  08/14/1931   MRN:  [id number]   Chief Complaint: Abdominal Pain  Abdominal Pain This is a recurrent problem. The current episode started more than 1 month ago. The problem occurs constantly. The problem has been unchanged. The pain is located in the RLQ and LLQ. The pain is at a severity of 1/10. The pain is mild. The quality of the pain is cramping. The abdominal pain does not radiate. Associated symptoms include diarrhea, headaches and nausea. Pertinent negatives include no dysuria, fever or frequency. The pain is relieved by Bowel movements. She has tried nothing for the symptoms. Her past medical history is significant for GERD.    Lab Results  Component Value Date   NA 139 12/25/2021   K 4.8 12/25/2021   CO2 23 12/25/2021   GLUCOSE 98 12/25/2021   BUN 29 (H) 12/25/2021   CREATININE 0.91 12/25/2021   CALCIUM 10.4 (H) 12/25/2021   EGFR 61 12/25/2021   GFRNONAA 55 04/23/2021   Lab Results  Component Value Date   CHOL 157 12/25/2021   HDL 69 12/25/2021   LDLCALC 73 12/25/2021   TRIG 81 12/25/2021   CHOLHDL 2.3 12/25/2021   Lab Results  Component Value Date   TSH 1.420 12/25/2021   Lab Results  Component Value Date   HGBA1C 5.2 04/22/2017   Lab Results  Component Value Date   WBC 7.7 01/29/2022   HGB 11.2 01/29/2022   HCT 36.3 01/29/2022   MCV 93 01/29/2022   PLT 351 01/29/2022   Lab Results  Component Value Date   ALT 13 12/25/2021   AST 18 12/25/2021   ALKPHOS 100 12/25/2021   BILITOT 0.5 12/25/2021   No results found for: "25OHVITD2", "25OHVITD3", "VD25OH"   Review of Systems  Constitutional:  Positive for fatigue. Negative for chills, fever and unexpected weight change.  Respiratory:  Negative for chest tightness and shortness of breath.   Gastrointestinal:  Positive for abdominal pain, diarrhea and nausea. Negative for blood in stool.  Genitourinary:  Negative for dysuria, frequency and urgency.   Neurological:  Positive for headaches.  Psychiatric/Behavioral:  Negative for sleep disturbance. The patient is not nervous/anxious.     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   Spondylosis of lumbar region without myelopathy or radiculopathy 10/21/2016   Hypothyroidism due to acquired atrophy of thyroid 04/22/2016   Insomnia 04/22/2016   Aortic atherosclerosis (HCC) 01/18/2015   Arthritis of neck 01/18/2015   Dermatitis, eczematoid 01/18/2015   Essential (primary) hypertension 01/18/2015   Esophagitis, reflux 01/18/2015   H/O adenomatous polyp of colon 01/18/2015   Migraine without aura and responsive to treatment 01/18/2015   Asthma, mild intermittent 01/18/2015   Hyperlipidemia, mild 01/18/2015   Lung nodule, multiple 01/18/2015   Kidney cysts 01/18/2015   Tobacco use disorder, moderate, in sustained remission 01/18/2015   Temporary cerebral vascular dysfunction 01/18/2015   Dysphagia 10/26/2013    Allergies  Allergen Reactions   Amlodipine Swelling   Tramadol Nausea Only and Rash   Ace Inhibitors Cough    Past Surgical History:  Procedure Laterality Date   ABDOMINAL HYSTERECTOMY       BLEPHAROPLASTY Bilateral 2013   BREAST BIOPSY Left 2009   benign   COLONOSCOPY  2006   Normal   ESI  01/2017   L5-S1 @ [facility]   ESOPHAGOGASTRODUODENOSCOPY  2015   Dilation of esophageal stricture   TOTAL HIP ARTHROPLASTY Right 2019    Social History   Tobacco Use   Smoking status: Former    Packs/day: 0.50    Years: 39.00    Total pack years: 19.50    Types: Cigarettes    Quit date: 1992    Years since quitting: 31.6   Smokeless  tobacco: Never   Tobacco comments:    smoking cessation materails not required  Vaping Use   Vaping Use: Never used  Substance Use Topics   Alcohol use: No    Alcohol/week: 0.0 standard drinks of alcohol   Drug use: No     Medication list has been reviewed and updated.  Current Meds  Medication Sig   albuterol (VENTOLIN HFA) 108 (90 Base) MCG/ACT inhaler Inhale into the lungs every 6 (six) hours as needed for wheezing or shortness of breath.   amoxicillin-clavulanate (AUGMENTIN) 875-125 MG tablet Take 1 tablet by mouth 2 (two) times daily for 10 days.   cetirizine (ZYRTEC) 10 MG tablet Take 10 mg by mouth daily.   ELIQUIS 5 MG TABS tablet Take 5 mg by mouth 2 (two) times daily.   fluticasone-salmeterol (ADVAIR) 100-50 MCG/ACT AEPB Inhale 1 puff into the lungs 2 (two) times daily.   HYDROcodone-acetaminophen (NORCO/VICODIN) 5-325 MG tablet Take 1 tablet by mouth every 6 (six) hours as needed for moderate pain.   irbesartan (AVAPRO) 300 MG tablet SMARTSIG:1 By Mouth   levothyroxine (SYNTHROID) 88 MCG tablet Take 1 tablet (88 mcg total) by mouth daily.   Magnesium Cl-Calcium Carbonate (SLOW-MAG PO) Take by mouth.   metoprolol succinate (TOPROL-XL) 50 MG 24 hr tablet Take 50 mg by mouth daily.   pantoprazole (PROTONIX) 40 MG tablet TAKE ONE TABLET BY MOUTH DAILY   rosuvastatin (CRESTOR) 5 MG tablet TAKE ONE TABLET BY MOUTH DAILY   traZODone (DESYREL) 150 MG tablet TAKE 1 TABLET BY MOUTH EVERY NIGHT AT BEDTIME AS NEEDED FOR SLEEP   Vitamin D, Cholecalciferol, 50 MCG (2000 UT) CAPS Take 2 capsules by mouth daily.   [DISCONTINUED] furosemide (LASIX) 20 MG tablet Take 2 tablets (40 mg total) by mouth daily. (Patient taking differently: Take 40 mg by mouth as needed.)       03/21/2022    7:59 AM 01/29/2022   11:04 AM 12/25/2021   10:08 AM 11/02/2021    1:27 PM  GAD 7 : Generalized Anxiety Score  Nervous, Anxious, on Edge 0 0 0 1  Control/stop worrying 1 0 0 0  Worry too much - different  things 1 0 1 0  Trouble relaxing 1 0 0 0  Restless 0 0 0 0  Easily annoyed or irritable 0 0 0 0  Afraid - awful might happen 0 0 0 0  Total GAD 7 Score 3 0 1 1  Anxiety Difficulty Not difficult at all Not difficult at all Not difficult at all        03/21/2022    7:59 AM 01/29/2022   11:04 AM 12/25/2021   10:08 AM  Depression screen PHQ 2/9  Decreased Interest 0 0 0  Down, Depressed, Hopeless 0 0 0  PHQ - 2 Score 0 0 0  Altered sleeping 0 0 0  Tired, decreased energy _0 Change  in appetite 1 1 1  Feeling bad or failure about yourself  0 0 0  Trouble concentrating 1 0 1  Moving slowly or fidgety/restless 0 0 0  Suicidal thoughts 0 0 0  PHQ-9 Score 3 3 3  Difficult doing work/chores Not difficult at all Not difficult at all Not difficult at all    BP Readings from Last 3 Encounters:  03/21/22 122/78  01/29/22 124/70  12/25/21 124/70    Physical Exam Vitals and nursing note reviewed.  Constitutional:      General: She is not in acute distress.    Appearance: She is well-developed.  HENT:     Head: Normocephalic and atraumatic.  Cardiovascular:     Rate and Rhythm: Normal rate and regular rhythm.  Pulmonary:     Effort: Pulmonary effort is normal. No respiratory distress.     Breath sounds: No wheezing or rhonchi.  Abdominal:     General: Abdomen is flat. Bowel sounds are decreased.     Palpations: Abdomen is soft.     Tenderness: There is generalized abdominal tenderness. There is no guarding or rebound.  Skin:    General: Skin is warm and dry.     Findings: No rash.  Neurological:     Mental Status: She is alert and oriented to person, place, and time.  Psychiatric:        Mood and Affect: Mood normal.        Behavior: Behavior normal.     Wt Readings from Last 3 Encounters:  03/21/22 140 lb (63.5 kg)  01/29/22 139 lb (63 kg)  12/25/21 141 lb (64 kg)    BP 122/78   Pulse 74   Ht 5' 2" (1.575 m)   Wt 140 lb (63.5 kg)   SpO2 98%   BMI 25.61 kg/m    Assessment and Plan: 1. Colitis Hospitalized in June - no cause found, not treated with antibiotics Ongoing symptoms without escalation - CBC with Differential/Platelet - GI Profile, Stool, PCR - amoxicillin-clavulanate (AUGMENTIN) 875-125 MG tablet; Take 1 tablet by mouth 2 (two) times daily for 10 days.  Dispense: 20 tablet; Refill: 0 - CT ABDOMEN PELVIS W CONTRAST  2. Lower abdominal pain - CT ABDOMEN PELVIS W CONTRAST   Partially dictated using Dragon software. Any errors are unintentional.  Laura Berglund, MD [facility]  03/21/2022      

## 2022-03-26 ENCOUNTER — Ambulatory Visit: Admission: RE | Admit: 2022-03-26 | Payer: Medicare Other | Source: Ambulatory Visit

## 2022-04-05 ENCOUNTER — Telehealth: Payer: Self-pay

## 2022-04-05 NOTE — Telephone Encounter (Signed)
Pt called, LVMTCB to discuss CT and why she didn't go to appt.

## 2022-04-05 NOTE — Telephone Encounter (Signed)
Called pt left VM to call back. Pt did not go to get her CT Abdomen done.Trying to find out information on why she wasn't able to go.  KP

## 2022-04-05 NOTE — Telephone Encounter (Signed)
-----   Message from Glean Hess, MD sent at 04/05/2022 12:55 PM EDT ----- It looks like Crystal Zavala did not show up for her CT abdomen.  Please call her to find out what happened.  ----- Message ----- From: SYSTEM Sent: 03/26/2022  12:12 AM EDT To: Glean Hess, MD

## 2022-04-22 ENCOUNTER — Ambulatory Visit (INDEPENDENT_AMBULATORY_CARE_PROVIDER_SITE_OTHER): Payer: Medicare Other | Admitting: Internal Medicine

## 2022-04-22 ENCOUNTER — Encounter: Payer: Self-pay | Admitting: Internal Medicine

## 2022-04-22 VITALS — BP 124/92 | HR 74 | Ht 62.0 in | Wt 134.0 lb

## 2022-04-22 DIAGNOSIS — N179 Acute kidney failure, unspecified: Secondary | ICD-10-CM

## 2022-04-22 DIAGNOSIS — F5101 Primary insomnia: Secondary | ICD-10-CM

## 2022-04-22 DIAGNOSIS — N3000 Acute cystitis without hematuria: Secondary | ICD-10-CM

## 2022-04-22 DIAGNOSIS — R531 Weakness: Secondary | ICD-10-CM | POA: Diagnosis not present

## 2022-04-22 DIAGNOSIS — E860 Dehydration: Secondary | ICD-10-CM | POA: Diagnosis not present

## 2022-04-22 DIAGNOSIS — M47816 Spondylosis without myelopathy or radiculopathy, lumbar region: Secondary | ICD-10-CM

## 2022-04-22 MED ORDER — ONDANSETRON HCL 4 MG PO TABS: 4.0000 mg | ORAL_TABLET | Freq: Three times a day (TID) | ORAL | 0 refills | Status: DC | PRN

## 2022-04-22 MED ORDER — CEFUROXIME AXETIL 250 MG PO TABS: 250.0000 mg | ORAL_TABLET | Freq: Two times a day (BID) | ORAL | 0 refills | Status: AC

## 2022-04-22 MED ORDER — DIAZEPAM 5 MG PO TABS: 5.0000 mg | ORAL_TABLET | Freq: Every evening | ORAL | 1 refills | Status: DC | PRN

## 2022-04-22 NOTE — Progress Notes (Signed)
Date:  04/22/2022   Name:  Crystal Zavala   DOB:  [birth year]   MRN:  [id number]   Chief Complaint: Hospitalization Follow-up Hospital follow up. Went to [facility] ER  on 04/19/22.  ER Note Assessment:  88 y.o. female w hx of hypertension, hypothyroidism, asthma, afib who presents with weakness and reduced urination Of note, weakness is not focal to the legs, she does have 5/5 strength throughout bl LEs and normal sensation. The combination of urinary sx with back pain and "weakness" does call to mind cauda equina but pt's motor and sensory exam does not support that diagnosis at this time. I am curious whether she is retaining urine or if she is just not producing urine due to poor PO intake. Will obtain bladder scan and reassess. As far as chest pressure, pt does have risk factors for CAD (age, HTN), should be screened for ACS although CP not active at this time. No severe HTN, ripping/tearing pain, radiation to back or neurologic sx to indicate Ao dissection. No hypoxia, tachycardia, pleuritic pain, VTE risk factors or s/s of DVT to indicate PE.   Plan:  CBC CMP - slight increase in WBC, normal LFTs, and renal parameters suggesting mild dehydration ECG HS trop - troponin negative x 3; EKG SR with PVCs otherwise normal. Bladder scan - no urine in bladder UA - negative  Reassessment:  7:15 PM Bladder scan per RN did not show any urine in the bladder, making urinary retention as etiology of her sx very unlikely and moving cauda equina even further down the differential. IVF initiated and then pt began to void spontaneously. Labs notable for elevated creatinine consistent with AKI which I suspect is prerenal from poor oral intake. She was previously having sx of opiate withdrawal including nausea and GI upset which was likely contributing to AKI. Third troponin is pending. Mg is pending. PT has been taking PO since getting fluids and reports she is feeling better. She has been up and ambulating w/o  difficulty. Her daughter is at the bedside and I have spoken with her as well as with the patient about need for close OP Follow up for repeat blood work to ensure Cr improving with improved oral hydration at home.   Urinary Tract Infection  This is a new problem. The current episode started in the past 7 days. Associated symptoms include nausea and vomiting. Pertinent negatives include no chills, frequency or urgency.  Back Pain This is a chronic problem. The pain is present in the lumbar spine, sacro-iliac and thoracic spine. The quality of the pain is described as aching and cramping. Pertinent negatives include no abdominal pain, chest pain or fever. Treatments tried: had been on Vicodin for years but when pain management saw her GFR in the 40's they stopped it completely.  last tablet was 18 days ago. However review of noted from Pain management indicated that she was given a refill of  3 per day PRN on 04/07/22.  . Improvement on treatment: ER felt that she was having withdrawal.  Pain management finally gave her a clonidine patch yesterday. She did not fill the Rx given on 9/3.  Insomnia Primary symptoms: sleep disturbance, frequent awakening.   The problem occurs nightly. Treatments tried: was doing okay with Trazodone but lately this does not help.  Culture from 9/13 shows E coli. Pan resistant only to Amp.   Unsure why the UA at [facility] was essentially clear.  On review, it did have 2+  Leuks.  This was obtained by "[employer] [facility]" - a mobile [facility]i that makes housecalls.  Patient was advised yesterday that she did not have a true infection.   Lab Results  Component Value Date   NA 139 12/25/2021   K 4.8 12/25/2021   CO2 23 12/25/2021   GLUCOSE 98 12/25/2021   BUN 29 (H) 12/25/2021   CREATININE 0.91 12/25/2021   CALCIUM 10.4 (H) 12/25/2021   EGFR 61 12/25/2021   GFRNONAA 55 04/23/2021   Lab Results  Component Value Date   CHOL 157 12/25/2021   HDL 69 12/25/2021   LDLCALC 73 12/25/2021    TRIG 81 12/25/2021   CHOLHDL 2.3 12/25/2021   Lab Results  Component Value Date   TSH 1.420 12/25/2021   Lab Results  Component Value Date   HGBA1C 5.2 04/22/2017   Lab Results  Component Value Date   WBC 7.7 01/29/2022   HGB 11.2 01/29/2022   HCT 36.3 01/29/2022   MCV 93 01/29/2022   PLT 351 01/29/2022   Lab Results  Component Value Date   ALT 13 12/25/2021   AST 18 12/25/2021   ALKPHOS 100 12/25/2021   BILITOT 0.5 12/25/2021   No results found for: "25OHVITD2", "25OHVITD3", "VD25OH"   Review of Systems  Constitutional:  Positive for appetite change and fatigue. Negative for chills, fever and unexpected weight change.  HENT:  Negative for trouble swallowing (consuming at least 4 16 oz cups per day).   Respiratory:  Negative for cough, chest tightness and shortness of breath.   Cardiovascular:  Negative for chest pain and leg swelling.  Gastrointestinal:  Positive for nausea and vomiting. Negative for abdominal distention and abdominal pain.  Genitourinary:  Negative for frequency and urgency.       Urine is pale yellow  Musculoskeletal:  Positive for arthralgias and back pain.  Psychiatric/Behavioral:  Positive for sleep disturbance. Negative for dysphoric mood. The patient has insomnia. The patient is not nervous/anxious.     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   Spondylosis of lumbar region without myelopathy or radiculopathy 10/21/2016   Hypothyroidism due to acquired atrophy of thyroid 04/22/2016   Insomnia 04/22/2016   Aortic atherosclerosis ([place]) 01/18/2015   Arthritis of neck 01/18/2015   Dermatitis, eczematoid  01/18/2015   Essential (primary) hypertension 01/18/2015   Esophagitis, reflux 01/18/2015   H/O adenomatous polyp of colon 01/18/2015   Migraine without aura and responsive to treatment 01/18/2015   Asthma, mild intermittent 01/18/2015   Hyperlipidemia, mild 01/18/2015   Lung nodule, multiple 01/18/2015   Kidney cysts 01/18/2015   Tobacco use disorder, moderate, in sustained remission 01/18/2015   Temporary cerebral vascular dysfunction 01/18/2015   Dysphagia 10/26/2013    Allergies  Allergen Reactions   Amlodipine Swelling   Tramadol Nausea Only and Rash   Ace Inhibitors Cough    Past Surgical History:  Procedure Laterality Date   ABDOMINAL HYSTERECTOMY     BLEPHAROPLASTY Bilateral 2013   BREAST BIOPSY Left 2009   benign   COLONOSCOPY  2006   Normal   ESI  01/2017   L5-S1 @ [facility]   ESOPHAGOGASTRODUODENOSCOPY  2015   Dilation of esophageal stricture  TOTAL HIP ARTHROPLASTY Right 2019    Social History   Tobacco Use   Smoking status: Former    Packs/day: 0.50    Years: 39.00    Total pack years: 19.50    Types: Cigarettes    Quit date: 1992    Years since quitting: 31.7   Smokeless tobacco: Never   Tobacco comments:    smoking cessation materails not required  Vaping Use   Vaping Use: Never used  Substance Use Topics   Alcohol use: No    Alcohol/week: 0.0 standard drinks of alcohol   Drug use: No     Medication list has been reviewed and updated.  Current Meds  Medication Sig   albuterol (VENTOLIN HFA) 108 (90 Base) MCG/ACT inhaler Inhale into the lungs every 6 (six) hours as needed for wheezing or shortness of breath.   cetirizine (ZYRTEC) 10 MG tablet Take 10 mg by mouth daily.   cloNIDine (CATAPRES - DOSED IN MG/24 HR) 0.1 mg/24hr patch Place 0.1 mg onto the skin once a week.   ELIQUIS 5 MG TABS tablet Take 5 mg by mouth 2 (two) times daily.   fluticasone-salmeterol (ADVAIR) 100-50 MCG/ACT AEPB Inhale 1 puff into the lungs 2 (two) times daily.    levothyroxine (SYNTHROID) 88 MCG tablet Take 1 tablet (88 mcg total) by mouth daily.   Magnesium Cl-Calcium Carbonate (SLOW-MAG PO) Take by mouth.   metoprolol succinate (TOPROL-XL) 50 MG 24 hr tablet Take 50 mg by mouth daily.   pantoprazole (PROTONIX) 40 MG tablet TAKE ONE TABLET BY MOUTH DAILY   rosuvastatin (CRESTOR) 5 MG tablet TAKE ONE TABLET BY MOUTH DAILY   traZODone (DESYREL) 150 MG tablet TAKE 1 TABLET BY MOUTH EVERY NIGHT AT BEDTIME AS NEEDED FOR SLEEP   Vitamin D, Cholecalciferol, 50 MCG (2000 UT) CAPS Take 2 capsules by mouth daily.       03/21/2022    7:59 AM 01/29/2022   11:04 AM 12/25/2021   10:08 AM 11/02/2021    1:27 PM  GAD 7 : Generalized Anxiety Score  Nervous, Anxious, on Edge 0 0 0 1  Control/stop worrying 1 0 0 0  Worry too much - different things 1 0 1 0  Trouble relaxing 1 0 0 0  Restless 0 0 0 0  Easily annoyed or irritable 0 0 0 0  Afraid - awful might happen 0 0 0 0  Total GAD 7 Score 3 0 1 1  Anxiety Difficulty Not difficult at all Not difficult at all Not difficult at all        03/21/2022    7:59 AM 01/29/2022   11:04 AM 12/25/2021   10:08 AM  Depression screen PHQ 2/9  Decreased Interest 0 0 0  Down, Depressed, Hopeless 0 0 0  PHQ - 2 Score 0 0 0  Altered sleeping 0 0 0  Tired, decreased energy 1 2 1   Change in appetite 1 1 1   Feeling bad or failure about yourself  0 0 0  Trouble concentrating 1 0 1  Moving slowly or fidgety/restless 0 0 0  Suicidal thoughts 0 0 0  PHQ-9 Score 3 3 3   Difficult doing work/chores Not difficult at all Not difficult at all Not difficult at all    BP Readings from Last 3 Encounters:  04/22/22 (!) 124/92  03/21/22 122/78  01/29/22 124/70    Physical Exam Constitutional:      Appearance: Normal appearance.  Cardiovascular:     Rate and Rhythm:  Normal rate and regular rhythm.  Pulmonary:     Effort: Pulmonary effort is normal.     Breath sounds: No wheezing or rhonchi.  Abdominal:     General: Abdomen is  flat. Bowel sounds are normal.     Palpations: Abdomen is soft. There is no mass.     Tenderness: There is no abdominal tenderness. There is no right CVA tenderness, left CVA tenderness or guarding.  Musculoskeletal:     Cervical back: Normal range of motion.     Right lower leg: No edema.     Left lower leg: No edema.  Skin:    General: Skin is warm.     Findings: Bruising present.  Neurological:     General: No focal deficit present.     Mental Status: She is alert and oriented to person, place, and time.     Motor: Weakness (uses rolator to walk) present.     Wt Readings from Last 3 Encounters:  04/22/22 134 lb (60.8 kg)  03/21/22 140 lb (63.5 kg)  01/29/22 139 lb (63 kg)    BP (!) 124/92   Pulse 74   Ht 5' 2"  (1.575 m)   Wt 134 lb (60.8 kg)   SpO2 97%   BMI 24.51 kg/m   Assessment and Plan: 1. AKI (acute kidney injury) ([place]) She appears to hydrating well. Will recheck labs and advise. - Basic metabolic panel  2. Dehydration Continue hydration; use zofran for nausea Increasing weakness - difficulty doing ADLs at home. No family can either stay with her or take her in. Might be by with [facility], PT and maybe an aid several days per week - Basic metabolic panel - ondansetron (ZOFRAN) 4 MG tablet; Take 1 tablet (4 mg total) by mouth every 8 (eight) hours as needed for nausea or vomiting.  Dispense: 30 tablet; Refill: 0 - Ambulatory referral to Home Health  3. Acute cystitis without hematuria I recommend treating the E coli even though it did not meet criteria for UTI - cefUROXime (CEFTIN) 250 MG tablet; Take 1 tablet (250 mg total) by mouth 2 (two) times daily with a meal for 5 days.  Dispense: 10 tablet; Refill: 0  4. Weakness generalized Likely a combination of narcotic withdrawal and subsequent back pain plus  subacute UTI - Ambulatory referral to [place]  5. Primary insomnia Stop Trazodone and try low dose Valium - it can help alleviate any residual withdrawal  symptoms  - diazepam (VALIUM) 5 MG tablet; Take 1 tablet (5 mg total) by mouth at bedtime as needed for anxiety.  Dispense: 30 tablet; Refill: 1  6. Spondylosis of lumbar region without myelopathy or radiculopathy Review of [place] noted indicates at refill of hydrocodone and plans for an [facility] for back pain. I have left a message for the patient to call back to explain what part of the history I am missing.   Partially dictated using Editor, commissioning. Any errors are unintentional.  Halina Maidens, MD [facility]  04/22/2022

## 2022-04-23 LAB — BASIC METABOLIC PANEL
BUN/Creatinine Ratio: 21 (ref 12–28)
BUN: 25 mg/dL (ref 8–27)
CO2: 19 mmol/L — ABNORMAL LOW (ref 20–29)
Calcium: 10.1 mg/dL (ref 8.7–10.3)
Chloride: 105 mmol/L (ref 96–106)
Creatinine, Ser: 1.17 mg/dL — ABNORMAL HIGH (ref 0.57–1.00)
Glucose: 91 mg/dL (ref 70–99)
Potassium: 4.8 mmol/L (ref 3.5–5.2)
Sodium: 141 mmol/L (ref 134–144)
eGFR: 45 mL/min/{1.73_m2} — ABNORMAL LOW (ref >59)

## 2022-04-25 ENCOUNTER — Other Ambulatory Visit: Payer: Self-pay | Admitting: Internal Medicine

## 2022-04-25 DIAGNOSIS — K2100 Gastro-esophageal reflux disease with esophagitis, without bleeding: Secondary | ICD-10-CM

## 2022-04-25 NOTE — Telephone Encounter (Signed)
Requested Prescriptions  Pending Prescriptions Disp Refills  . pantoprazole (PROTONIX) 40 MG tablet [Pharmacy Med Name: PANTOPRAZOLE SOD DR 40 MG TAB] 90 tablet 1    Sig: TAKE ONE TABLET BY MOUTH DAILY     Gastroenterology: Proton Pump Inhibitors Passed - 04/25/2022  6:07 AM      Passed - Valid encounter within last 12 months    Recent Outpatient Visits          3 days ago AKI (acute kidney injury) ([place])   [place] [facility] at [facility], Jesse Sans, MD   1 month ago Colitis   [place] [facility] at [facility], Jesse Sans, MD   2 months ago Acute colitis   [facility] at [facility], Jesse Sans, MD   4 months ago Essential (primary) hypertension   [place] [facility] at [facility], Jesse Sans, MD   5 months ago Change in bowel habits   [place] [facility] at [facility], Jesse Sans, MD

## 2022-05-13 ENCOUNTER — Telehealth: Payer: Self-pay | Admitting: Internal Medicine

## 2022-05-13 NOTE — Telephone Encounter (Signed)
Copied from [place] [id number]. Topic: General - Other >> May 13, 2022 12:32 PM Leitha Schuller wrote: Caller inquiring the status of PA for back orthopedics faxed 10-06  Please assist further

## 2022-05-13 NOTE — Telephone Encounter (Signed)
Copied from [place] [id number]. Topic: General - Other >> May 13, 2022 12:32 PM Leitha Schuller wrote: Caller inquiring the status of PA for back orthopedics faxed 10-06  Please assist further >> May 13, 2022  4:56 PM Ja-Kwan M wrote: [facility] [employer] rep called inquiring about the status of PA for back orthopedics faxed 05/10/22. Cb# [phone]

## 2022-05-13 NOTE — Telephone Encounter (Signed)
Called pt she stated that she did not want any brace or anything for her back. She stated that the lidocaine patches are helping her.  KP

## 2022-05-14 NOTE — Telephone Encounter (Signed)
Caller checking status of PA  Advised caller pt has declined product and PA will not be completed  Caller verbalized understanding

## 2022-05-14 NOTE — Telephone Encounter (Signed)
Spoke to pt on 05/13/22. Pt stated she didn't want to get anything for her back. We will not be completing a PA.  KP

## 2022-05-15 ENCOUNTER — Other Ambulatory Visit (INDEPENDENT_AMBULATORY_CARE_PROVIDER_SITE_OTHER): Payer: Medicare Other | Admitting: Internal Medicine

## 2022-05-15 DIAGNOSIS — B9620 Unspecified Escherichia coli [E. coli] as the cause of diseases classified elsewhere: Secondary | ICD-10-CM

## 2022-05-15 DIAGNOSIS — I639 Cerebral infarction, unspecified: Secondary | ICD-10-CM

## 2022-05-15 DIAGNOSIS — Z7901 Long term (current) use of anticoagulants: Secondary | ICD-10-CM

## 2022-05-15 DIAGNOSIS — N3000 Acute cystitis without hematuria: Secondary | ICD-10-CM | POA: Diagnosis not present

## 2022-05-15 DIAGNOSIS — N289 Disorder of kidney and ureter, unspecified: Secondary | ICD-10-CM

## 2022-05-15 DIAGNOSIS — N281 Cyst of kidney, acquired: Secondary | ICD-10-CM

## 2022-05-15 DIAGNOSIS — I480 Paroxysmal atrial fibrillation: Secondary | ICD-10-CM

## 2022-05-15 DIAGNOSIS — M47816 Spondylosis without myelopathy or radiculopathy, lumbar region: Secondary | ICD-10-CM

## 2022-05-15 DIAGNOSIS — N390 Urinary tract infection, site not specified: Secondary | ICD-10-CM

## 2022-05-15 DIAGNOSIS — K2100 Gastro-esophageal reflux disease with esophagitis, without bleeding: Secondary | ICD-10-CM

## 2022-05-15 DIAGNOSIS — I700 Atherosclerosis of aorta: Secondary | ICD-10-CM

## 2022-05-15 DIAGNOSIS — I10 Essential (primary) hypertension: Secondary | ICD-10-CM

## 2022-05-15 DIAGNOSIS — E785 Hyperlipidemia, unspecified: Secondary | ICD-10-CM

## 2022-05-15 DIAGNOSIS — J4520 Mild intermittent asthma, uncomplicated: Secondary | ICD-10-CM

## 2022-05-15 DIAGNOSIS — E034 Atrophy of thyroid (acquired): Secondary | ICD-10-CM

## 2022-05-15 DIAGNOSIS — F5101 Primary insomnia: Secondary | ICD-10-CM

## 2022-05-15 DIAGNOSIS — Z96641 Presence of right artificial hip joint: Secondary | ICD-10-CM

## 2022-05-15 DIAGNOSIS — K529 Noninfective gastroenteritis and colitis, unspecified: Secondary | ICD-10-CM

## 2022-05-15 DIAGNOSIS — E860 Dehydration: Secondary | ICD-10-CM

## 2022-05-15 DIAGNOSIS — N179 Acute kidney failure, unspecified: Secondary | ICD-10-CM

## 2022-05-15 NOTE — Progress Notes (Signed)
Received home health orders orders from [facility]. Start of care 04/24/22.   Certification and orders from 04/24/22 through 06/22/22 are reviewed, signed and faxed back to home health company.  Need of intermittent skilled services at home: homebound  The home health care plan has been established by me and will be reviewed and updated as needed to maximize patient recovery.  I certify that all home health services have been and will be furnished to the patient while under my care.  Face-to-face encounter in which the need for home health services was established: 04/22/22.  Patient is receiving home health services for the following diagnoses: Problem List Items Addressed This Visit       Cardiovascular and Mediastinum   Aortic atherosclerosis ([place])   Cerebrovascular accident (CVA) ([place]) (Chronic)   Essential (primary) hypertension (Chronic)   Paroxysmal atrial fibrillation (HCC) (Chronic)     Respiratory   Mild intermittent asthma without complication     Digestive   Gastroesophageal reflux disease with esophagitis     Endocrine   Hypothyroidism due to acquired atrophy of thyroid (Chronic)     Musculoskeletal and Integument   Spondylosis of lumbar region without myelopathy or radiculopathy (Chronic)     Genitourinary   Acquired cyst of kidney   Renal insufficiency (Chronic)     Other   Hyperlipidemia, mild (Chronic)   Long term (current) use of anticoagulants   Presence of right artificial hip joint   Primary insomnia   Other Visit Diagnoses     Acute cystitis without hematuria    -  Primary   Acute colitis       AKI (acute kidney injury) ([place])       Escherichia coli urinary tract infection       Dehydration            Halina Maidens, MD

## 2022-05-25 ENCOUNTER — Other Ambulatory Visit: Payer: Self-pay | Admitting: Internal Medicine

## 2022-05-27 NOTE — Telephone Encounter (Signed)
Requested Prescriptions  Pending Prescriptions Disp Refills  . levothyroxine (SYNTHROID) 88 MCG tablet [Pharmacy Med Name: LEVOTHYROXINE 88 MCG TABLET] 90 tablet 1    Sig: TAKE 1 TABLET BY MOUTH DAILY     Endocrinology:  Hypothyroid Agents Passed - 05/25/2022  9:43 AM      Passed - TSH in normal range and within 360 days    TSH  Date Value Ref Range Status  12/25/2021 1.420 0.450 - 4.500 uIU/mL Final         Passed - Valid encounter within last 12 months    Recent Outpatient Visits          1 month ago AKI (acute kidney injury) ([place])   [place] [facility] at [facility], Jesse Sans, MD   2 months ago Colitis   [place] [facility] at [facility] Jesse Sans, MD   3 months ago Acute colitis   [facility] at [facility], Jesse Sans, MD   5 months ago Essential (primary) hypertension   [place] [facility] at [facility], Jesse Sans, MD   6 months ago Change in bowel habits   [place] [facility] at [facility], Jesse Sans, MD

## 2022-06-06 ENCOUNTER — Telehealth: Payer: Self-pay | Admitting: Internal Medicine

## 2022-06-06 NOTE — Telephone Encounter (Signed)
Janett Billow calling from [facility] is calling to report that the patient told her she is no longer in need of [place] services. Please advise  CB- [phone]

## 2022-06-07 NOTE — Telephone Encounter (Signed)
Called Janett Billow and left her VM informing if patient declines [place] services then there is nothing else we can do.  - Dnyla Antonetti

## 2022-06-22 ENCOUNTER — Other Ambulatory Visit: Payer: Self-pay | Admitting: Internal Medicine

## 2022-06-22 DIAGNOSIS — F5101 Primary insomnia: Secondary | ICD-10-CM

## 2022-06-24 NOTE — Telephone Encounter (Signed)
Requested Prescriptions  Pending Prescriptions Disp Refills   traZODone (DESYREL) 150 MG tablet [Pharmacy Med Name: traZODone 150 MG TABLET] 90 tablet 1    Sig: TAKE ONE TABLET BY MOUTH EVERY NIGHT AT BEDTIME AS NEEDED FOR SLEEP     Psychiatry: Antidepressants - Serotonin Modulator Passed - 06/22/2022  6:37 AM      Passed - Valid encounter within last 6 months    Recent Outpatient Visits           2 months ago AKI (acute kidney injury) ([place])   [place] [facility] at [facility], Jesse Sans, MD   3 months ago Colitis   [place] [facility] at [facility], Jesse Sans, MD   4 months ago Acute colitis   [facility] at [facility], Jesse Sans, MD   6 months ago Essential (primary) hypertension   [place] [facility] at [facility], Jesse Sans, MD   7 months ago Change in bowel habits   [place] [facility] at [facility], Jesse Sans, MD

## 2022-10-19 ENCOUNTER — Other Ambulatory Visit: Payer: Self-pay | Admitting: Internal Medicine

## 2022-10-19 DIAGNOSIS — K2100 Gastro-esophageal reflux disease with esophagitis, without bleeding: Secondary | ICD-10-CM

## 2022-10-21 NOTE — Telephone Encounter (Signed)
Noted  KP 

## 2022-10-29 LAB — VITAMIN B12: Vitamin B-12: 142

## 2022-10-29 LAB — TSH: TSH: 1.62 (ref 0.41–5.90)

## 2022-10-29 LAB — IRON,TIBC AND FERRITIN PANEL
Ferritin: 38
Iron: 9

## 2022-10-31 ENCOUNTER — Telehealth: Payer: Self-pay

## 2022-10-31 NOTE — Transitions of Care (Post Inpatient/ED Visit) (Signed)
   10/31/2022  Name: Crystal Zavala MRN: [id number] DOB: 08-07-1931  Today's TOC FU Call Status: Today's TOC FU Call Status:: Successful TOC FU Call Competed TOC FU Call Complete Date: 10/31/22  Transition Care Management Follow-up Telephone Call Date of Discharge: 10/30/22 Discharge Facility: Other ([place]) Name of Other ([facility]) Discharge Facility: [facility] Type of Discharge: Inpatient Admission Primary Inpatient Discharge Diagnosis:: dyspnea How have you been since you were released from the hospital?: Better Any questions or concerns?: No  Items Reviewed: Did you receive and understand the discharge instructions provided?: Yes Medications obtained and verified?: Yes (Medications Reviewed) Any new allergies since your discharge?: No Dietary orders reviewed?: Yes Do you have support at home?: No  Home Care and Equipment/Supplies: [place] Ordered?: Yes Name of [place]:: unknown Has Agency set up a time to come to your home?: No Any new equipment or medical supplies ordered?: NA  Functional Questionnaire: Do you need assistance with bathing/showering or dressing?: No Do you need assistance with meal preparation?: No Do you need assistance with eating?: No Do you have difficulty maintaining continence: No Do you need assistance with getting out of bed/getting out of a chair/moving?: No Do you have difficulty managing or taking your medications?: No  Follow up appointments reviewed: PCP Follow-up appointment confirmed?: Yes Date of PCP follow-up appointment?: 11/08/22 Follow-up Provider: [facility] Follow-up appointment confirmed?: NA Do you need transportation to your follow-up appointment?: No Do you understand care options if your condition(s) worsen?: Yes-patient verbalized understanding    [place], [place] Direct Dial [phone]

## 2022-10-31 NOTE — Telephone Encounter (Signed)
ERROR

## 2022-11-04 ENCOUNTER — Telehealth: Payer: Self-pay | Admitting: Internal Medicine

## 2022-11-04 NOTE — Telephone Encounter (Signed)
Verbal orders given.  - Crystal Zavala 

## 2022-11-04 NOTE — Telephone Encounter (Signed)
Copied from [place] [id number]. Topic: Quick Communication - Home Health Verbal Orders >> Nov 04, 2022  1:27 PM Everette C wrote: Caller/Agency: [facility] / [place] Number: [phone] Requesting OT/PT/Skilled Nursing/Social Work/Speech Therapy: PT Frequency: 1w9

## 2022-11-04 NOTE — Telephone Encounter (Signed)
Copied from [place] [phone]. Topic: General - Inquiry >> Nov 04, 2022 12:40 PM Crystal Zavala wrote: Reason for CRM: Dallie Piles from [facility] stated OT evaluation was completed and pt will not be seen for OT services at this time.  Please advise.

## 2022-11-08 ENCOUNTER — Ambulatory Visit (INDEPENDENT_AMBULATORY_CARE_PROVIDER_SITE_OTHER): Payer: Medicare Other | Admitting: Internal Medicine

## 2022-11-08 ENCOUNTER — Encounter: Payer: Self-pay | Admitting: Internal Medicine

## 2022-11-08 VITALS — BP 110/70 | HR 71 | Ht 62.0 in | Wt 139.0 lb

## 2022-11-08 DIAGNOSIS — D519 Vitamin B12 deficiency anemia, unspecified: Secondary | ICD-10-CM

## 2022-11-08 DIAGNOSIS — E034 Atrophy of thyroid (acquired): Secondary | ICD-10-CM

## 2022-11-08 DIAGNOSIS — I5032 Chronic diastolic (congestive) heart failure: Secondary | ICD-10-CM

## 2022-11-08 DIAGNOSIS — T148XXA Other injury of unspecified body region, initial encounter: Secondary | ICD-10-CM

## 2022-11-08 NOTE — Assessment & Plan Note (Signed)
Dose of supplement recently reduced in hospital to 75 mcg Will recheck TFTs next visit

## 2022-11-08 NOTE — Progress Notes (Signed)
Date:  11/08/2022   Name:  Crystal Zavala   DOB:  November 11, 1931   MRN:  [id number]   Chief Complaint: Hospitalization Follow-up Hospital follow up.  Admitted to [facility] 10/28/22 to 10/30/22.  TOC call done on  10/31/22. HPI Admission Diagnoses: Dyspnea, unspecified type [R06.00]  Discharge Diagnoses: Active Problems: Hypertension Thyroid disease Paroxysmal atrial fibrillation (CMS-HCC) Resolved Problems: Hypoxia Primary Diagnosis: Admitted for malaise, shortness of breath and new oxygen requirement improved with diuresis; suspect viral GI illness/HFpEF exacerbation. Feeling much improved on room air at discharge.  Changes Made: - Elevated FT4 during admission with normal TSH; decreased synthroid from 88 mcg to 75 mcg QAM (prior dose) - Started on PO ferrous sulfate for iron deficiency - Started on B12 supplementation for deficiency  Anticipatory Guidance for Outpatient Provider: - Found to have stable RLL groundglass opacities on CT PE scan; recommend repeat CT in 2 years (10/2024) to assess for interval change, per Radiology.  Recommended Follow-up Studies: - BMP, CBC, Mag - Iron studies, B12, TSH/FT4 in 6-8 weeks (12/10/22 - 12/24/22) - Repeat CT chest in 2026   Results Pending at Discharge: None Please see phone numbers at end of this summary for lab contact information.  Follow-up/Care Transition Plan: No future appointments. Non-[facility] Provider Follow-up: Primary Care Appointment with Dr. Asencion Partridge: 11/08/2022 at 2:00PM  She is feeling much better, back to herself.  Some weakness is improving with home PT.  Taking medications as prescribed.  Added B12 and Iron.  Not taking Vitamin D for some time. Renal function and Mg at discharge were back to baseline. She is drinking plenty of fluids and eating well but has very little protein in her diet.    Lab Results  Component Value Date   NA 141 04/22/2022   K 4.8 04/22/2022   CO2 19 (L) 04/22/2022   GLUCOSE 91 04/22/2022   BUN 25  04/22/2022   CREATININE 1.17 (H) 04/22/2022   CALCIUM 10.1 04/22/2022   EGFR 45 (L) 04/22/2022   GFRNONAA 55 04/23/2021   Lab Results  Component Value Date   CHOL 157 12/25/2021   HDL 69 12/25/2021   LDLCALC 73 12/25/2021   TRIG 81 12/25/2021   CHOLHDL 2.3 12/25/2021   Lab Results  Component Value Date   TSH 1.420 12/25/2021   Lab Results  Component Value Date   HGBA1C 5.2 04/22/2017   Lab Results  Component Value Date   WBC 7.7 01/29/2022   HGB 11.2 01/29/2022   HCT 36.3 01/29/2022   MCV 93 01/29/2022   PLT 351 01/29/2022   Lab Results  Component Value Date   ALT 13 12/25/2021   AST 18 12/25/2021   ALKPHOS 100 12/25/2021   BILITOT 0.5 12/25/2021   No results found for: "25OHVITD2", "25OHVITD3", "VD25OH"   Review of Systems  Constitutional:  Positive for fatigue. Negative for chills, fever and unexpected weight change.  Respiratory:  Negative for cough, chest tightness and shortness of breath.   Cardiovascular:  Negative for chest pain and leg swelling.  Musculoskeletal:  Positive for back pain and gait problem.  Hematological:  Bruises/bleeds easily.  Psychiatric/Behavioral:  Negative for sleep disturbance. The patient is not nervous/anxious.     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   Spondylosis of lumbar region without myelopathy or radiculopathy 10/21/2016   Hypothyroidism due to acquired atrophy of thyroid 04/22/2016   Primary insomnia 04/22/2016   Aortic atherosclerosis 01/18/2015   Arthritis of neck 01/18/2015    Dermatitis, eczematoid 01/18/2015   Essential (primary) hypertension 01/18/2015   Gastroesophageal reflux disease with esophagitis 01/18/2015   H/O adenomatous polyp of colon 01/18/2015   Migraine without aura and responsive to treatment 01/18/2015   Mild intermittent asthma without complication 01/18/2015   Hyperlipidemia, mild 01/18/2015   Lung nodule, multiple 01/18/2015   Acquired cyst of kidney 01/18/2015   Tobacco use disorder, moderate, in sustained remission 01/18/2015   Temporary cerebral vascular dysfunction 01/18/2015   Dysphagia 10/26/2013    Allergies  Allergen Reactions   Amlodipine Swelling   Tramadol Nausea Only and Rash   Ace Inhibitors Cough    Past Surgical History:  Procedure Laterality Date   ABDOMINAL HYSTERECTOMY     BLEPHAROPLASTY Bilateral 2013   BREAST BIOPSY Left 2009   benign   COLONOSCOPY  2006   Normal   ESI  01/2017   L5-S1 @ [facility]   ESOPHAGOGASTRODUODENOSCOPY  2015   Dilation of esophageal stricture   TOTAL HIP ARTHROPLASTY Right 2019    Social History   Tobacco Use   Smoking status: Former    Packs/day: 0.50    Years: 39.00    Additional pack years: 0.00    Total pack years: 19.50    Types: Cigarettes    Quit date: 1992    Years since quitting: 32.2   Smokeless tobacco: Never   Tobacco comments:    smoking cessation materails not required  Vaping Use   Vaping Use: Never used  Substance Use Topics   Alcohol use: No    Alcohol/week: 0.0 standard drinks of alcohol   Drug use: No     Medication list has been reviewed and updated.  Current Meds  Medication Sig   albuterol (VENTOLIN HFA) 108 (90 Base) MCG/ACT inhaler Inhale into the lungs every 6 (six) hours as needed for wheezing or shortness of breath.   cetirizine (ZYRTEC) 10 MG tablet Take 10 mg by mouth daily.   cyanocobalamin (VITAMIN B12) 1000 MCG tablet Take 1,000 mcg by mouth daily.   ELIQUIS 5 MG TABS tablet Take 5 mg by mouth 2 (two) times daily.   ferrous sulfate  324 MG TBEC Take 324 mg by mouth.   fluticasone-salmeterol (ADVAIR) 100-50 MCG/ACT AEPB Inhale 1 puff into the lungs 2 (two) times daily.   HYDROcodone-acetaminophen (NORCO) 7.5-325 MG tablet Take 1 tablet by mouth every 6 (six) hours as needed for moderate pain.   levothyroxine (SYNTHROID) 88 MCG tablet TAKE 1 TABLET BY MOUTH DAILY   metoprolol succinate (TOPROL-XL) 50 MG 24 hr tablet Take 50 mg by mouth daily.   ondansetron (ZOFRAN) 4 MG tablet Take 1 tablet (4 mg total) by mouth every 8 (eight) hours as needed for nausea or vomiting.   pantoprazole (PROTONIX) 40 MG tablet TAKE 1 TABLET BY MOUTH DAILY   rosuvastatin (CRESTOR) 5 MG tablet TAKE ONE TABLET BY MOUTH DAILY       11/08/2022    2:06 PM 04/22/2022    1:47 PM 03/21/2022    7:59 AM 01/29/2022   11:04 AM  GAD 7 : Generalized Anxiety Score  Nervous, Anxious, on Edge  0 3 0 0  Control/stop worrying 0 3 1 0  Worry too much - different things 0 3 1 0  Trouble relaxing 0 3 1 0  Restless 0 3 0 0  Easily annoyed or irritable 0 1 0 0  Afraid - awful might happen 0 1 0 0  Total GAD 7 Score 0 17 3 0  Anxiety Difficulty Not difficult at all Somewhat difficult Not difficult at all Not difficult at all       11/08/2022    2:06 PM 04/22/2022    1:47 PM 03/21/2022    7:59 AM  Depression screen PHQ 2/9  Decreased Interest 0 1 0  Down, Depressed, Hopeless 0 0 0  PHQ - 2 Score 0 1 0  Altered sleeping 0 3 0  Tired, decreased energy 0 3 1  Change in appetite 0 3 1  Feeling bad or failure about yourself  0 1 0  Trouble concentrating 0 2 1  Moving slowly or fidgety/restless 0 3 0  Suicidal thoughts 0 1 0  PHQ-9 Score 0 17 3  Difficult doing work/chores Not difficult at all Extremely dIfficult Not difficult at all    BP Readings from Last 3 Encounters:  11/08/22 110/70  04/22/22 (!) 124/92  03/21/22 122/78    Physical Exam Constitutional:      Appearance: Normal appearance.  Cardiovascular:     Rate and Rhythm: Normal rate. Rhythm  irregular.     Heart sounds: No murmur heard. Pulmonary:     Effort: Pulmonary effort is normal.     Breath sounds: Normal breath sounds. No wheezing or rhonchi.  Musculoskeletal:     Cervical back: Normal range of motion.     Right lower leg: No edema.     Left lower leg: No edema.  Skin:    Capillary Refill: Capillary refill takes less than 2 seconds.     Findings: Bruising and ecchymosis (right lower leg from minor trauma) present.  Neurological:     General: No focal deficit present.     Mental Status: She is alert.     Wt Readings from Last 3 Encounters:  11/08/22 139 lb (63 kg)  04/22/22 134 lb (60.8 kg)  03/21/22 140 lb (63.5 kg)    BP 110/70   Pulse 71   Ht 5\' 2"  (1.575 m)   Wt 139 lb (63 kg)   SpO2 97%   BMI 25.42 kg/m   Assessment and Plan:  Problem List Items Addressed This Visit       Endocrine   Hypothyroidism due to acquired atrophy of thyroid (Chronic)    Dose of supplement recently reduced in hospital to 75 mcg Will recheck TFTs next visit      Other Visit Diagnoses     Chronic heart failure with preserved ejection fraction (HFpEF)    -  Primary   with chronic Afib continue Eliquis  work on diet - increase protein intake continue home PT   Anemia due to vitamin B12 deficiency, unspecified B12 deficiency type       continue B12 and iron check CBC and levels next visit   Relevant Medications   cyanocobalamin (VITAMIN B12) 1000 MCG tablet   ferrous sulfate 324 MG TBEC   Hematoma and contusion       monitor for worsening elevate and avoid further trauma       Return in about 6 weeks (around 12/20/2022) for thyroid, anemia.   Partially dictated using [employer], any  errors are not intentional.  Reubin MilanLaura H. Jereline Ticer, MD [facility] [place]

## 2022-11-21 ENCOUNTER — Other Ambulatory Visit (INDEPENDENT_AMBULATORY_CARE_PROVIDER_SITE_OTHER): Payer: Medicare Other | Admitting: Internal Medicine

## 2022-11-21 DIAGNOSIS — F17201 Nicotine dependence, unspecified, in remission: Secondary | ICD-10-CM

## 2022-11-21 DIAGNOSIS — I480 Paroxysmal atrial fibrillation: Secondary | ICD-10-CM

## 2022-11-21 DIAGNOSIS — I10 Essential (primary) hypertension: Secondary | ICD-10-CM

## 2022-11-21 DIAGNOSIS — D3610 Benign neoplasm of peripheral nerves and autonomic nervous system, unspecified: Secondary | ICD-10-CM | POA: Insufficient documentation

## 2022-11-21 DIAGNOSIS — I639 Cerebral infarction, unspecified: Secondary | ICD-10-CM

## 2022-11-21 DIAGNOSIS — D6869 Other thrombophilia: Secondary | ICD-10-CM

## 2022-11-21 DIAGNOSIS — J4520 Mild intermittent asthma, uncomplicated: Secondary | ICD-10-CM

## 2022-11-21 DIAGNOSIS — Z7901 Long term (current) use of anticoagulants: Secondary | ICD-10-CM

## 2022-11-21 DIAGNOSIS — M47816 Spondylosis without myelopathy or radiculopathy, lumbar region: Secondary | ICD-10-CM

## 2022-11-21 NOTE — Progress Notes (Signed)
Received home health orders orders from [facility]. Start of care 11/02/22.   Certification and orders from 11/02/22 through 12/31/22 are reviewed, signed and faxed back to home health company.  Need of intermittent skilled services at home:   The home health care plan has been established by me and will be reviewed and updated as needed to maximize patient recovery.  I certify that all home health services have been and will be furnished to the patient while under my care.  Face-to-face encounter in which the need for home health services was established: 11/08/22  Patient is receiving home health services for the following diagnoses: Problem List Items Addressed This Visit       Cardiovascular and Mediastinum   Cerebrovascular accident (CVA) (Chronic)   Essential (primary) hypertension (Chronic)   Paroxysmal atrial fibrillation (Chronic)     Respiratory   Mild intermittent asthma without complication - Primary     Nervous and Auditory   Benign neoplasm of peripheral, sympathetic, and parasympathetic nerves and ganglia     Musculoskeletal and Integument   Spondylosis of lumbar region without myelopathy or radiculopathy (Chronic)     Hematopoietic and Hemostatic   Acquired thrombophilia (Chronic)     Other   Long term (current) use of anticoagulants   Tobacco use disorder, moderate, in sustained remission     Bari Edward, MD

## 2022-11-25 ENCOUNTER — Other Ambulatory Visit: Payer: Self-pay | Admitting: Internal Medicine

## 2022-11-25 MED ORDER — VITAMIN B-12 1000 MCG PO TABS: 1000.0000 ug | ORAL_TABLET | Freq: Every day | ORAL | 1 refills | Status: AC

## 2022-11-25 NOTE — Telephone Encounter (Signed)
Medication Refill - Medication:  levothyroxine (SYNTHROID) 75 MCG tablet  cyanocobalamin (VITAMIN B12) 1000 MCG tablet   Has the patient contacted their pharmacy? Yes.   To call provider  Preferred Pharmacy (with phone number or street name):  [id number] [place], [place] - [address] Phone: [phone]  Fax: [phone]     Has the patient been seen for an appointment in the last year OR does the patient have an upcoming appointment? Yes.   Last ov 11/08/22. Next ov 12/24/22.  Agent: Please be advised that RX refills may take up to 3 business days. We ask that you follow-up with your pharmacy.  *Levothyroxine was decreased to 75 mcg according to visit notes on 11/08/22.

## 2022-11-25 NOTE — Telephone Encounter (Signed)
Requested medication (s) are due for refill today: Yes  Requested medication (s) are on the active medication list: Yes  Last refill:    Future visit scheduled: Yes  Notes to clinic:  Unable to refill per protocol, last refill by another provider. levothyroxine (SYNTHROID) 75 MCG tablet also requested and this will be a new Rx, see OV notes 11/08/22.     Requested Prescriptions  Pending Prescriptions Disp Refills   cyanocobalamin (VITAMIN B12) 1000 MCG tablet      Sig: Take 1 tablet (1,000 mcg total) by mouth daily.     Endocrinology:  Vitamins - Vitamin B12 Failed - 11/25/2022  1:00 PM      Failed - B12 Level in normal range and within 360 days    No results found for: "VITAMINB12"       Passed - HCT in normal range and within 360 days    Hematocrit  Date Value Ref Range Status  01/29/2022 36.3 34.0 - 46.6 % Final         Passed - HGB in normal range and within 360 days    Hemoglobin  Date Value Ref Range Status  01/29/2022 11.2 11.1 - 15.9 g/dL Final         Passed - Valid encounter within last 12 months    Recent Outpatient Visits           2 weeks ago Chronic heart failure with preserved ejection fraction (HFpEF)   [place] [facility] at [facility], Nyoka Cowden, MD   7 months ago AKI (acute kidney injury) [facility])   [place] [facility] Medicine at [facility], Nyoka Cowden, MD   8 months ago Colitis   [facility] Medicine at [facility], Nyoka Cowden, MD   10 months ago Acute colitis   [facility] at [facility], Nyoka Cowden, MD   11 months ago Essential (primary) hypertension   [facility] at [facility], Nyoka Cowden, MD       Future Appointments             In 4 weeks Judithann Graves Nyoka Cowden, MD [facility] Primary Care & Sports Medicine at [facility], [facility]

## 2022-11-25 NOTE — Telephone Encounter (Signed)
Please review.  KP

## 2022-11-28 ENCOUNTER — Other Ambulatory Visit: Payer: Self-pay | Admitting: Internal Medicine

## 2022-11-28 DIAGNOSIS — E785 Hyperlipidemia, unspecified: Secondary | ICD-10-CM

## 2022-12-02 ENCOUNTER — Other Ambulatory Visit: Payer: Self-pay | Admitting: Internal Medicine

## 2022-12-02 MED ORDER — LEVOTHYROXINE SODIUM 88 MCG PO TABS: 88.0000 ug | ORAL_TABLET | Freq: Every day | ORAL | 1 refills | Status: DC

## 2022-12-02 NOTE — Telephone Encounter (Signed)
Patient wants clarity of which dose to take of the levothyroxine 75 or 88 mcg, please advise patient.

## 2022-12-02 NOTE — Telephone Encounter (Signed)
Medication Refill - Medication: levothyroxine (SYNTHROID) 88 MCG tablet   Says that she takes 75 MCG but was prescribed 88 MCG in the hospital. Wants to know which one PCP advises? Needs a refill, she is completely out.   Has the patient contacted their pharmacy? Yes.   (Agent: If no, request that the patient contact the pharmacy for the refill. If patient does not wish to contact the pharmacy document the reason why and proceed with request.) (Agent: If yes, when and what did the pharmacy advise?)  Preferred Pharmacy (with phone number or street name):  [facility] [id number] [place], [place] - [address]  [address] [place] [address]  Phone: [phone] Fax: [phone]   Has the patient been seen for an appointment in the last year OR does the patient have an upcoming appointment? Yes.    Agent: Please be advised that RX refills may take up to 3 business days. We ask that you follow-up with your pharmacy.

## 2022-12-18 ENCOUNTER — Ambulatory Visit (INDEPENDENT_AMBULATORY_CARE_PROVIDER_SITE_OTHER): Payer: Medicare Other

## 2022-12-18 VITALS — Ht 62.0 in | Wt 139.0 lb

## 2022-12-18 DIAGNOSIS — Z0000 Encounter for general adult medical examination without abnormal findings: Secondary | ICD-10-CM | POA: Diagnosis not present

## 2022-12-18 NOTE — Progress Notes (Signed)
I connected with  Crystal Zavala on 12/18/22 by a audio enabled telemedicine application and verified that I am speaking with the correct person using two identifiers.  Patient Location: Home  Provider Location: Office/Clinic  I discussed the limitations of evaluation and management by telemedicine. The patient expressed understanding and agreed to proceed.  Subjective:   Crystal Zavala is a 89 y.o. female who presents for Medicare Annual (Subsequent) preventive examination.  Review of Systems     Cardiac Risk Factors include: advanced age (>56men, >31 women);dyslipidemia;hypertension     Objective:    Today's Vitals   12/18/22 1001  PainSc: 0-No pain   There is no height or weight on file to calculate BMI.     12/18/2022   10:06 AM 12/05/2021   10:09 AM 12/04/2020   10:21 AM 12/01/2019   10:22 AM 11/30/2018    1:39 PM 10/23/2017   10:46 AM 10/21/2016    9:42 AM  Advanced Directives  Does Patient Have a Medical Advance Directive? No Yes Yes Yes Yes Yes Yes  Type of Furniture conservator/restorer;Living will Healthcare Power of [place];Living will Healthcare Power of [place];Living will Healthcare Power of [place];Living will Healthcare Power of [place];Living will Healthcare Power of [place];Living will  Copy of Healthcare Power of Attorney in Chart?  Yes - validated most recent copy scanned in chart (See row information) No - copy requested No - copy requested No - copy requested No - copy requested No - copy requested  Would patient like information on creating a medical advance directive? No - Patient declined          Current Medications (verified) Outpatient Encounter Medications as of 12/18/2022  Medication Sig   albuterol (VENTOLIN HFA) 108 (90 Base) MCG/ACT inhaler Inhale into the lungs every 6 (six) hours as needed for wheezing or shortness of breath.   cetirizine (ZYRTEC) 10 MG tablet Take 10 mg by mouth daily.   cyanocobalamin (VITAMIN B12) 1000  MCG tablet Take 1 tablet (1,000 mcg total) by mouth daily.   ELIQUIS 5 MG TABS tablet Take 5 mg by mouth 2 (two) times daily.   ferrous sulfate 324 MG TBEC Take 324 mg by mouth.   fluticasone-salmeterol (ADVAIR) 100-50 MCG/ACT AEPB Inhale 1 puff into the lungs 2 (two) times daily.   HYDROcodone-acetaminophen (NORCO) 7.5-325 MG tablet Take 1 tablet by mouth every 6 (six) hours as needed for moderate pain.   levothyroxine (SYNTHROID) 88 MCG tablet Take 1 tablet (88 mcg total) by mouth daily.   metoprolol succinate (TOPROL-XL) 50 MG 24 hr tablet Take 50 mg by mouth daily.   pantoprazole (PROTONIX) 40 MG tablet TAKE 1 TABLET BY MOUTH DAILY   rosuvastatin (CRESTOR) 5 MG tablet TAKE ONE TABLET BY MOUTH DAILY   Vitamin D, Cholecalciferol, 50 MCG (2000 UT) CAPS Take 2 capsules by mouth daily.   ondansetron (ZOFRAN) 4 MG tablet Take 1 tablet (4 mg total) by mouth every 8 (eight) hours as needed for nausea or vomiting. (Patient not taking: Reported on 12/18/2022)   No facility-administered encounter medications on file as of 12/18/2022.    Allergies (verified) Amlodipine, Tramadol, and Ace inhibitors   History: Past Medical History:  Diagnosis Date   Allergy to tramadol 09/03/2018   Asthma    Breast mass, right 01/10/2016   Seen on Mammogram 12/2015 - addi-views appeared benign - repeat 6 months Mammogram 01/2017 with [place] - no changed in right density over 2 years Return to annual  screening   GERD (gastroesophageal reflux disease)    Hyperlipidemia    Hypertension    Osteoarthritis    in back   Scoliosis    Spondylosis    Thyroid disease    Past Surgical History:  Procedure Laterality Date   ABDOMINAL HYSTERECTOMY     BLEPHAROPLASTY Bilateral 2013   BREAST BIOPSY Left 2009   benign   COLONOSCOPY  2006   Normal   ESI  01/2017   L5-S1 @ [facility]   ESOPHAGOGASTRODUODENOSCOPY  2015   Dilation of esophageal stricture   TOTAL HIP ARTHROPLASTY Right 2019   Family History  Problem Relation Age of  Onset   Tuberculosis Mother    Stroke Father    Bipolar disorder Daughter    Alzheimer's disease Brother    Heart attack Brother    Heart disease Brother    Bipolar disorder Sister    Diabetes Son    Heart attack Brother    Heart disease Brother    Heart attack Brother    Heart disease Brother    Social History   Socioeconomic History   Marital status: Widowed    Spouse name: Not on file   Number of children: 2   Years of education: Not on file   Highest education level: 12th grade  Occupational History   Occupation: Retired  Tobacco Use   Smoking status: Former    Packs/day: 0.50    Years: 39.00    Additional pack years: 0.00    Total pack years: 19.50    Types: Cigarettes    Quit date: 1992    Years since quitting: 32.3   Smokeless tobacco: Never   Tobacco comments:    smoking cessation materails not required  Vaping Use   Vaping Use: Never used  Substance and Sexual Activity   Alcohol use: No    Alcohol/week: 0.0 standard drinks of alcohol   Drug use: No   Sexual activity: Never  Other Topics Concern   Not on file  Social History Narrative   Pt lives alone   Social Determinants of Health   Financial Resource Strain: Low Risk  (12/18/2022)   Overall Financial Resource Strain (CARDIA)    Difficulty of Paying Living Expenses: Not hard at all  Food Insecurity: No Food Insecurity (12/18/2022)   Hunger Vital Sign    Worried About Running Out of Food in the Last Year: Never true    Ran Out of Food in the Last Year: Never true  Transportation Needs: No Transportation Needs (12/18/2022)   PRAPARE - Administrator, Civil Service (Medical): No    Lack of Transportation (Non-Medical): No  Physical Activity: Insufficiently Active (12/18/2022)   Exercise Vital Sign    Days of Exercise per Week: 3 days    Minutes of Exercise per Session: 30 min  Stress: No Stress Concern Present (12/18/2022)   [employer] of Occupational Health - Occupational Stress  Questionnaire    Feeling of Stress : Not at all  Social Connections: Socially Isolated (12/18/2022)   Social Connection and Isolation Panel [NHANES]    Frequency of Communication with Friends and Family: More than three times a week    Frequency of Social Gatherings with Friends and Family: Not on file    Attends Religious Services: Never    Active Member of Clubs or Organizations: No    Attends Banker Meetings: Never    Marital Status: Widowed    Tobacco Counseling Counseling given: Not  Answered Tobacco comments: smoking cessation materails not required   Clinical Intake:  Pre-visit preparation completed: Yes  Pain : No/denies pain Pain Score: 0-No pain     Nutritional Risks: None Diabetes: No  How often do you need to have someone help you when you read instructions, pamphlets, or other written materials from your doctor or pharmacy?: 1 - Never  Diabetic?no  Interpreter Needed?: No  Information entered by :: Kennedy Bucker, LPN   Activities of Daily Living    12/18/2022   10:06 AM  In your present state of health, do you have any difficulty performing the following activities:  Hearing? 0  Vision? 0  Difficulty concentrating or making decisions? 0  Walking or climbing stairs? 0  Dressing or bathing? 0  Doing errands, shopping? 0  Preparing Food and eating ? N  Using the Toilet? N  In the past six months, have you accidently leaked urine? N  Do you have problems with loss of bowel control? N  Managing your Medications? N  Managing your Finances? N  Housekeeping or managing your Housekeeping? N    Patient Care Team: Reubin Milan, MD as PCP - General (Internal Medicine) Adriana Reams, DO as Referring Physician (Interventional Cardiology)  Indicate any recent Medical Services you may have received from other than [facility] providers in the past year (date may be approximate).     Assessment:   This is a routine wellness examination for  Crystal Zavala.  Hearing/Vision screen Hearing Screening - Comments:: No aids Vision Screening - Comments:: No glasses- MD at [facility]  Dietary issues and exercise activities discussed: Current Exercise Habits: Home exercise routine, Type of exercise: walking, Time (Minutes): 30, Frequency (Times/Week): 3, Weekly Exercise (Minutes/Week): 90, Intensity: Mild   Goals Addressed             This Visit's Progress    DIET - EAT MORE FRUITS AND VEGETABLES         Depression Screen    12/18/2022   10:04 AM 11/08/2022    2:06 PM 04/22/2022    1:47 PM 03/21/2022    7:59 AM 01/29/2022   11:04 AM 12/25/2021   10:08 AM 12/05/2021   10:08 AM  PHQ 2/9 Scores  PHQ - 2 Score 0 0 1 0 0 0 0  PHQ- 9 Score 0 0 17 3 3 3  0    Fall Risk    12/18/2022   10:06 AM 11/08/2022    2:06 PM 04/22/2022    1:47 PM 03/21/2022    7:59 AM 01/29/2022   11:04 AM  Fall Risk   Falls in the past year? 0 0 1 1 0  Number falls in past yr: 0 0 1 0 0  Injury with Fall? 0 0 0 0 0  Risk for fall due to : No Fall Risks No Fall Risks History of fall(s) History of fall(s) No Fall Risks  Follow up Falls prevention discussed;Falls evaluation completed Falls evaluation completed Falls evaluation completed Falls evaluation completed Falls evaluation completed    FALL RISK PREVENTION PERTAINING TO THE HOME:  Any stairs in or around the home? Yes  If so, are there any without handrails? No  Home free of loose throw rugs in walkways, pet beds, electrical cords, etc? Yes  Adequate lighting in your home to reduce risk of falls? Yes   ASSISTIVE DEVICES UTILIZED TO PREVENT FALLS:  Life alert? No  Use of a cane, walker or w/c? Yes - walker Grab bars in  the bathroom? Yes  Shower chair or bench in shower? No  Elevated toilet seat or a handicapped toilet? No    Cognitive Function:        12/18/2022   10:10 AM 12/01/2019   10:25 AM 11/30/2018    1:42 PM 10/23/2017   10:51 AM 10/21/2016    9:46 AM  6CIT Screen  What Year? 0 points 0 points  0 points 0 points 0 points  What month? 0 points 0 points 0 points 0 points 0 points  What time? 0 points 0 points 0 points 0 points 0 points  Count back from 20 0 points 0 points 0 points 0 points 0 points  Months in reverse 0 points 0 points 0 points 0 points 0 points  Repeat phrase 0 points 0 points 0 points 0 points 2 points  Total Score 0 points 0 points 0 points 0 points 2 points    Immunizations Immunization History  Administered Date(s) Administered   Fluad Quad(high Dose 65+) 03/31/2019, 05/10/2020, 05/22/2021   Influenza, High Dose Seasonal PF 04/22/2017   Influenza,inj,Quad PF,6+ Mos 04/22/2016   Influenza-Unspecified 06/05/2018   PFIZER(Purple Top)SARS-COV-2 Vaccination 08/28/2019, 09/19/2019   Pneumococcal Conjugate-13 09/13/2014   Pneumococcal Polysaccharide-23 10/03/2004   Tdap 08/27/2011, 12/12/2021    TDAP status: Up to date  Flu Vaccine status: Declined, Education has been provided regarding the importance of this vaccine but patient still declined. Advised may receive this vaccine at local pharmacy or Health Dept. Aware to provide a copy of the vaccination record if obtained from local pharmacy or Health Dept. Verbalized acceptance and understanding.  Pneumococcal vaccine status: Up to date  Covid-19 vaccine status: Completed vaccines  Qualifies for Shingles Vaccine? No   Zostavax completed No   Shingrix Completed?: No.    Education has been provided regarding the importance of this vaccine. Patient has been advised to call insurance company to determine out of pocket expense if they have not yet received this vaccine. Advised may also receive vaccine at local pharmacy or Health Dept. Verbalized acceptance and understanding.  Screening Tests Health Maintenance  Topic Date Due   Zoster Vaccines- Shingrix (1 of 2) Never done   COVID-19 Vaccine (3 - Pfizer risk series) 10/17/2019   MAMMOGRAM  10/24/2020   INFLUENZA VACCINE  03/06/2023   Medicare Annual  Wellness (AWV)  12/18/2023   DTaP/Tdap/Td (3 - Td or Tdap) 12/13/2031   Pneumonia Vaccine 11+ Years old  Completed   DEXA SCAN  Completed   HPV VACCINES  Aged Out    Health Maintenance  Health Maintenance Due  Topic Date Due   Zoster Vaccines- Shingrix (1 of 2) Never done   COVID-19 Vaccine (3 - Pfizer risk series) 10/17/2019   MAMMOGRAM  10/24/2020    Colorectal cancer screening: No longer required.   Mammogram status: No longer required due to age.   Lung Cancer Screening: (Low Dose CT Chest recommended if Age 43-80 years, 30 pack-year currently smoking OR have quit w/in 15years.) does not qualify.   Additional Screening:  Hepatitis C Screening: does not qualify; Completed no  Vision Screening: Recommended annual ophthalmology exams for early detection of glaucoma and other disorders of the eye. Is the patient up to date with their annual eye exam?  Yes  Who is the provider or what is the name of the office in which the patient attends annual eye exams? [facility] If pt is not established with a provider, would they like to be referred to  a provider to establish care? No .   Dental Screening: Recommended annual dental exams for proper oral hygiene  Community Resource Referral / Chronic Care Management: CRR required this visit?  No   CCM required this visit?  No      Plan:     I have personally reviewed and noted the following in the patient's chart:   Medical and social history Use of alcohol, tobacco or illicit drugs  Current medications and supplements including opioid prescriptions. Patient is not currently taking opioid prescriptions. Functional ability and status Nutritional status Physical activity Advanced directives List of other physicians Hospitalizations, surgeries, and ER visits in previous 12 months Vitals Screenings to include cognitive, depression, and falls Referrals and appointments  In addition, I have reviewed and discussed with patient  certain preventive protocols, quality metrics, and best practice recommendations. A written personalized care plan for preventive services as well as general preventive health recommendations were provided to patient.     Hal Hope, LPN   [id number]   Nurse Notes: none

## 2022-12-18 NOTE — Patient Instructions (Signed)
Crystal Zavala , Thank you for taking time to come for your Medicare Wellness Visit. I appreciate your ongoing commitment to your health goals. Please review the following plan we discussed and let me know if I can assist you in the future.   These are the goals we discussed:  Goals      DIET - EAT MORE FRUITS AND VEGETABLES     DIET - INCREASE WATER INTAKE     Recommend to drink at least 6-8 8oz glasses of water per day.     Increase physical activity     Pt would like to walk more as tolerated with back pain         This is a list of the screening recommended for you and due dates:  Health Maintenance  Topic Date Due   Zoster (Shingles) Vaccine (1 of 2) Never done   COVID-19 Vaccine (3 - Pfizer risk series) 10/17/2019   Mammogram  10/24/2020   Flu Shot  03/06/2023   Medicare Annual Wellness Visit  12/18/2023   DTaP/Tdap/Td vaccine (3 - Td or Tdap) 12/13/2031   Pneumonia Vaccine  Completed   DEXA scan (bone density measurement)  Completed   HPV Vaccine  Aged Out    Advanced directives: no  Conditions/risks identified: none  Next appointment: Follow up in one year for your annual wellness visit 12/24/23 @ 9:45 am by phone   Preventive Care 65 Years and Older, Female Preventive care refers to lifestyle choices and visits with your health care provider that can promote health and wellness. What does preventive care include? A yearly physical exam. This is also called an annual well check. Dental exams once or twice a year. Routine eye exams. Ask your health care provider how often you should have your eyes checked. Personal lifestyle choices, including: Daily care of your teeth and gums. Regular physical activity. Eating a healthy diet. Avoiding tobacco and drug use. Limiting alcohol use. Practicing safe sex. Taking low-dose aspirin every day. Taking vitamin and mineral supplements as recommended by your health care provider. What happens during an annual well  check? The services and screenings done by your health care provider during your annual well check will depend on your age, overall health, lifestyle risk factors, and family history of disease. Counseling  Your health care provider may ask you questions about your: Alcohol use. Tobacco use. Drug use. Emotional well-being. Home and relationship well-being. Sexual activity. Eating habits. History of falls. Memory and ability to understand (cognition). Work and work Astronomer. Reproductive health. Screening  You may have the following tests or measurements: Height, weight, and BMI. Blood pressure. Lipid and cholesterol levels. These may be checked every 5 years, or more frequently if you are over 67 years old. Skin check. Lung cancer screening. You may have this screening every year starting at age 77 if you have a 30-pack-year history of smoking and currently smoke or have quit within the past 15 years. Fecal occult blood test (FOBT) of the stool. You may have this test every year starting at age 33. Flexible sigmoidoscopy or colonoscopy. You may have a sigmoidoscopy every 5 years or a colonoscopy every 10 years starting at age 57. Hepatitis C blood test. Hepatitis B blood test. Sexually transmitted disease (STD) testing. Diabetes screening. This is done by checking your blood sugar (glucose) after you have not eaten for a while (fasting). You may have this done every 1-3 years. Bone density scan. This is done to screen for osteoporosis. You  may have this done starting at age 48. Mammogram. This may be done every 1-2 years. Talk to your health care provider about how often you should have regular mammograms. Talk with your health care provider about your test results, treatment options, and if necessary, the need for more tests. Vaccines  Your health care provider may recommend certain vaccines, such as: Influenza vaccine. This is recommended every year. Tetanus, diphtheria, and  acellular pertussis (Tdap, Td) vaccine. You may need a Td booster every 10 years. Zoster vaccine. You may need this after age 24. Pneumococcal 13-valent conjugate (PCV13) vaccine. One dose is recommended after age 41. Pneumococcal polysaccharide (PPSV23) vaccine. One dose is recommended after age 2. Talk to your health care provider about which screenings and vaccines you need and how often you need them. This information is not intended to replace advice given to you by your health care provider. Make sure you discuss any questions you have with your health care provider. Document Released: 08/18/2015 Document Revised: 04/10/2016 Document Reviewed: 05/23/2015 Elsevier Interactive Patient Education  2017 [employer].  Fall Prevention in the Home Falls can cause injuries. They can happen to people of all ages. There are many things you can do to make your home safe and to help prevent falls. What can I do on the outside of my home? Regularly fix the edges of walkways and driveways and fix any cracks. Remove anything that might make you trip as you walk through a door, such as a raised step or threshold. Trim any bushes or trees on the path to your home. Use bright outdoor lighting. Clear any walking paths of anything that might make someone trip, such as rocks or tools. Regularly check to see if handrails are loose or broken. Make sure that both sides of any steps have handrails. Any raised decks and porches should have guardrails on the edges. Have any leaves, snow, or ice cleared regularly. Use sand or salt on walking paths during winter. Clean up any spills in your garage right away. This includes oil or grease spills. What can I do in the bathroom? Use night lights. Install grab bars by the toilet and in the tub and shower. Do not use towel bars as grab bars. Use non-skid mats or decals in the tub or shower. If you need to sit down in the shower, use a plastic, non-slip stool. Keep  the floor dry. Clean up any water that spills on the floor as soon as it happens. Remove soap buildup in the tub or shower regularly. Attach bath mats securely with double-sided non-slip rug tape. Do not have throw rugs and other things on the floor that can make you trip. What can I do in the bedroom? Use night lights. Make sure that you have a light by your bed that is easy to reach. Do not use any sheets or blankets that are too big for your bed. They should not hang down onto the floor. Have a firm chair that has side arms. You can use this for support while you get dressed. Do not have throw rugs and other things on the floor that can make you trip. What can I do in the kitchen? Clean up any spills right away. Avoid walking on wet floors. Keep items that you use a lot in easy-to-reach places. If you need to reach something above you, use a strong step stool that has a grab bar. Keep electrical cords out of the way. Do not use floor  polish or wax that makes floors slippery. If you must use wax, use non-skid floor wax. Do not have throw rugs and other things on the floor that can make you trip. What can I do with my stairs? Do not leave any items on the stairs. Make sure that there are handrails on both sides of the stairs and use them. Fix handrails that are broken or loose. Make sure that handrails are as long as the stairways. Check any carpeting to make sure that it is firmly attached to the stairs. Fix any carpet that is loose or worn. Avoid having throw rugs at the top or bottom of the stairs. If you do have throw rugs, attach them to the floor with carpet tape. Make sure that you have a light switch at the top of the stairs and the bottom of the stairs. If you do not have them, ask someone to add them for you. What else can I do to help prevent falls? Wear shoes that: Do not have high heels. Have rubber bottoms. Are comfortable and fit you well. Are closed at the toe. Do not  wear sandals. If you use a stepladder: Make sure that it is fully opened. Do not climb a closed stepladder. Make sure that both sides of the stepladder are locked into place. Ask someone to hold it for you, if possible. Clearly mark and make sure that you can see: Any grab bars or handrails. First and last steps. Where the edge of each step is. Use tools that help you move around (mobility aids) if they are needed. These include: Canes. Walkers. Scooters. Crutches. Turn on the lights when you go into a dark area. Replace any light bulbs as soon as they burn out. Set up your furniture so you have a clear path. Avoid moving your furniture around. If any of your floors are uneven, fix them. If there are any pets around you, be aware of where they are. Review your medicines with your doctor. Some medicines can make you feel dizzy. This can increase your chance of falling. Ask your doctor what other things that you can do to help prevent falls. This information is not intended to replace advice given to you by your health care provider. Make sure you discuss any questions you have with your health care provider. Document Released: 05/18/2009 Document Revised: 12/28/2015 Document Reviewed: 08/26/2014 Elsevier Interactive Patient Education  2017 [employer].

## 2022-12-23 ENCOUNTER — Telehealth: Payer: Self-pay | Admitting: Internal Medicine

## 2022-12-23 ENCOUNTER — Ambulatory Visit: Payer: Self-pay | Admitting: *Deleted

## 2022-12-23 NOTE — Telephone Encounter (Signed)
  Chief Complaint: wound not healing after taking antibiotics since 12/20/22 Symptoms: right inner ankle wound scab keeps coming off at night when dressing applied. Oozing clear drainage. Taking antibiotics since 12/20/22 Frequency: 2 weeks ago  Pertinent Negatives: Patient denies fever no redness reported  Disposition: [] ED /[] Urgent Care (no appt availability in office) / [x] Appointment(In office/virtual)/ []  [facility]/ [] Home Care/ [] Refused Recommended Disposition /[] [facility]/ []  Follow-up with PCP Additional Notes:   Appt already scheduled for tomorrow.    Reason for Disposition  [1] Pus or cloudy fluid draining from wound AND [2] no fever  Answer Assessment - Initial Assessment Questions 1. LOCATION: "Where is the wound located?"      Right inner ankle area of leg 2. WOUND APPEARANCE: "What does the wound look like?"      Scab keep coming off at night when dressing applied and oozing clear drainage. Just not healing and taking antibiotics 3. SIZE: If redness is present, ask: "What is the size of the red area?" (Inches, centimeters, or compare to size of a coin)      Na  4. SPREAD: "What's changed in the last day?"  "Do you see any red streaks coming from the wound?"     Not healing  5. ONSET: "When did it start to look infected?"      Went to [facility] 12/20/22 wound noted 2 weeks ago  6. MECHANISM: "How did the wound start, what was the cause?"     Not sure  7. PAIN: Do you have any pain?"  If Yes, ask: "How bad is the pain?"  (e.g., Scale 1-10; mild, moderate, or severe)    - MILD (1-3): Doesn't interfere with normal activities.     - MODERATE (4-7): Interferes with normal activities or awakens from sleep.    - SEVERE (8-10): Excruciating pain, unable to do any normal activities.       na 8. FEVER: "Do you have a fever?" If Yes, ask: "What is your temperature, how was it measured, and when did it start?"     Na 9. OTHER SYMPTOMS: "Do you have any other  symptoms?" (e.g., shaking chills, weakness, rash elsewhere on body)     None  10. PREGNANCY: "Is there any chance you are pregnant?" "When was your last menstrual period?"       na  Protocols used: Wound Infection Suspected-A-AH

## 2022-12-23 NOTE — Telephone Encounter (Signed)
Left voice mail to respond to patients statement about having edema, and she is too call the office is she needs to come in sooner for an appointment.

## 2022-12-23 NOTE — Telephone Encounter (Signed)
Summary: Wound on right leg   Pt has a wound on her right leg that is not healing, unsure of how she accrued the wound. Says it looks bad, it "oozes" but it will not heal. (Has an appt tomorrow with PCP)  Best contact: [phone]       Called patient to review sx of "oozing " wound on [phone]. Line busy no answer, unable to leave message

## 2022-12-24 ENCOUNTER — Other Ambulatory Visit: Payer: Self-pay | Admitting: Internal Medicine

## 2022-12-24 ENCOUNTER — Ambulatory Visit (INDEPENDENT_AMBULATORY_CARE_PROVIDER_SITE_OTHER): Payer: Medicare Other | Admitting: Internal Medicine

## 2022-12-24 ENCOUNTER — Encounter: Payer: Self-pay | Admitting: Internal Medicine

## 2022-12-24 VITALS — BP 118/62 | HR 83 | Ht 62.0 in | Wt 139.0 lb

## 2022-12-24 DIAGNOSIS — D519 Vitamin B12 deficiency anemia, unspecified: Secondary | ICD-10-CM

## 2022-12-24 DIAGNOSIS — I480 Paroxysmal atrial fibrillation: Secondary | ICD-10-CM

## 2022-12-24 DIAGNOSIS — S81811D Laceration without foreign body, right lower leg, subsequent encounter: Secondary | ICD-10-CM

## 2022-12-24 DIAGNOSIS — E034 Atrophy of thyroid (acquired): Secondary | ICD-10-CM

## 2022-12-24 DIAGNOSIS — I700 Atherosclerosis of aorta: Secondary | ICD-10-CM | POA: Diagnosis not present

## 2022-12-24 DIAGNOSIS — D6869 Other thrombophilia: Secondary | ICD-10-CM

## 2022-12-24 LAB — BASIC METABOLIC PANEL
BUN: 26 — AB (ref 4–21)
Chloride: 107 (ref 99–108)
Creatinine: 0.8 (ref 0.5–1.1)
Glucose: 95
Potassium: 4.3 mEq/L (ref 3.5–5.1)
Sodium: 139 (ref 137–147)

## 2022-12-24 LAB — CBC AND DIFFERENTIAL
HCT: 34 — AB (ref 36–46)
Hemoglobin: 9.8 — AB (ref 12.0–16.0)
Platelets: 358 10*3/uL (ref 150–400)
WBC: 9

## 2022-12-24 LAB — COMPREHENSIVE METABOLIC PANEL: Calcium: 1.3 — AB (ref 8.7–10.7)

## 2022-12-24 NOTE — Assessment & Plan Note (Signed)
Supplemented with slightly high T4 last check. On 88 mcg.  Will recheck labs and advise

## 2022-12-24 NOTE — Assessment & Plan Note (Signed)
Rate controlled on metoprolol Continue current medications, Eliquis

## 2022-12-24 NOTE — Assessment & Plan Note (Signed)
On Eliquis with no evidence of bleeding. Addressing anemia currently

## 2022-12-24 NOTE — Assessment & Plan Note (Signed)
On appropriate statin therapy 

## 2022-12-24 NOTE — Progress Notes (Signed)
Date:  12/24/2022   Name:  Crystal Zavala   DOB:  08/02/1932   MRN:  [id number]   Chief Complaint: Hypothyroidism and Anemia  Anemia Presents for follow-up visit. Symptoms include bruises/bleeds easily. There has been no abdominal pain, anorexia, fever, leg swelling, malaise/fatigue or weight loss. Signs of blood loss that are not present include melena. Compliance problems: intolerant of iron - stomach pain.  Compliance with medications: may be taking W29 - not certain.  Thyroid Problem Presents for follow-up visit. Patient reports no anxiety, fatigue or weight loss. The symptoms have been stable.    Lab Results  Component Value Date   NA 139 12/24/2022   K 4.3 12/24/2022   CO2 19 (L) 04/22/2022   GLUCOSE 91 04/22/2022   BUN 26 (A) 12/24/2022   CREATININE 0.8 12/24/2022   CALCIUM 1.3 (A) 12/24/2022   EGFR 45 (L) 04/22/2022   GFRNONAA 55 04/23/2021   Lab Results  Component Value Date   CHOL 157 12/25/2021   HDL 69 12/25/2021   LDLCALC 73 12/25/2021   TRIG 81 12/25/2021   CHOLHDL 2.3 12/25/2021   Lab Results  Component Value Date   TSH 1.62 10/29/2022   Lab Results  Component Value Date   HGBA1C 5.2 04/22/2017   Lab Results  Component Value Date   WBC 9.0 12/24/2022   HGB 9.8 (A) 12/24/2022   HCT 34 (A) 12/24/2022   MCV 93 01/29/2022   PLT 358 12/24/2022   Lab Results  Component Value Date   ALT 13 12/25/2021   AST 18 12/25/2021   ALKPHOS 100 12/25/2021   BILITOT 0.5 12/25/2021   No results found for: "25OHVITD2", "25OHVITD3", "VD25OH"   Review of Systems  Constitutional:  Negative for chills, fatigue, fever, malaise/fatigue and weight loss.  HENT:  Negative for trouble swallowing.   Respiratory:  Negative for chest tightness and shortness of breath.   Cardiovascular:  Negative for chest pain.  Gastrointestinal:  Negative for abdominal pain, anal bleeding, anorexia, blood in stool and melena.  Musculoskeletal:  Positive for arthralgias and gait  problem.  Skin:  Positive for wound (right lower leg).  Neurological:  Negative for dizziness and headaches.  Hematological:  Bruises/bleeds easily.  Psychiatric/Behavioral:  Negative for sleep disturbance. The patient is not nervous/anxious.     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   Spondylosis of lumbar region without myelopathy or radiculopathy 10/21/2016   Hypothyroidism due to acquired atrophy of thyroid 04/22/2016   Primary insomnia 04/22/2016   Aortic atherosclerosis (HCC) 01/18/2015   Arthritis of neck 01/18/2015   Dermatitis, eczematoid 01/18/2015   Essential (primary) hypertension 01/18/2015   Gastroesophageal reflux disease with esophagitis 01/18/2015   H/O adenomatous polyp of colon 01/18/2015   Migraine without aura and responsive to treatment 01/18/2015   Mild intermittent asthma without complication 01/18/2015   Hyperlipidemia, mild 01/18/2015   Lung nodule, multiple 01/18/2015   Acquired cyst of kidney 01/18/2015   Tobacco use disorder, moderate, in sustained remission 01/18/2015  Temporary cerebral vascular dysfunction 01/18/2015   Dysphagia 10/26/2013    Allergies  Allergen Reactions   Amlodipine Swelling   Tramadol Nausea Only and Rash   Ace Inhibitors Cough    Past Surgical History:  Procedure Laterality Date   ABDOMINAL HYSTERECTOMY     BLEPHAROPLASTY Bilateral 2013   BREAST BIOPSY Left 2009   benign    COLONOSCOPY  2006   Normal   ESI  01/2017   L5-S1 @ [facility]   ESOPHAGOGASTRODUODENOSCOPY  2015   Dilation of esophageal stricture   TOTAL HIP ARTHROPLASTY Right 2019    Social History   Tobacco Use   Smoking status: Former    Packs/day: 0.50    Years: 39.00    Additional pack years: 0.00    Total pack years: 19.50    Types: Cigarettes    Quit date: 1992    Years since quitting: 32.4   Smokeless tobacco: Never   Tobacco comments:    smoking cessation materails not required  Vaping Use   Vaping Use: Never used  Substance Use Topics   Alcohol use: No    Alcohol/week: 0.0 standard drinks of alcohol   Drug use: No     Medication list has been reviewed and updated.  Current Meds  Medication Sig   albuterol (VENTOLIN HFA) 108 (90 Base) MCG/ACT inhaler Inhale into the lungs every 6 (six) hours as needed for wheezing or shortness of breath.   cephALEXin (KEFLEX) 500 MG capsule Take 500 mg by mouth 2 (two) times daily.   cetirizine (ZYRTEC) 10 MG tablet Take 10 mg by mouth daily.   cyanocobalamin (VITAMIN B12) 1000 MCG tablet Take 1 tablet (1,000 mcg total) by mouth daily.   ELIQUIS 5 MG TABS tablet Take 5 mg by mouth 2 (two) times daily.   fluticasone-salmeterol (ADVAIR) 100-50 MCG/ACT AEPB Inhale 1 puff into the lungs 2 (two) times daily.   HYDROcodone-acetaminophen (NORCO) 7.5-325 MG tablet Take 1 tablet by mouth every 6 (six) hours as needed for moderate pain.   levothyroxine (SYNTHROID) 88 MCG tablet Take 1 tablet (88 mcg total) by mouth daily.   metoprolol succinate (TOPROL-XL) 50 MG 24 hr tablet Take 50 mg by mouth daily.   ondansetron (ZOFRAN) 4 MG tablet Take 1 tablet (4 mg total) by mouth every 8 (eight) hours as needed for nausea or vomiting.   pantoprazole (PROTONIX) 40 MG tablet TAKE 1 TABLET BY MOUTH DAILY   rosuvastatin (CRESTOR) 5 MG tablet TAKE ONE TABLET BY MOUTH DAILY   Vitamin D, Cholecalciferol, 50 MCG (2000 UT) CAPS Take 2 capsules by mouth daily.    [DISCONTINUED] ferrous sulfate 324 MG TBEC Take 324 mg by mouth.       12/24/2022   10:59 AM 11/08/2022    2:06 PM 04/22/2022    1:47 PM 03/21/2022    7:59 AM  GAD 7 : Generalized Anxiety Score  Nervous, Anxious, on Edge 0 0 3 0  Control/stop worrying 0 0 3 1  Worry too much - different things 1 0 3 1  Trouble relaxing 0 0 3 1  Restless 0 0 3 0  Easily annoyed or irritable 0 0 1 0  Afraid - awful might happen 0 0 1 0  Total GAD 7 Score 1 0 17 3  Anxiety Difficulty Not difficult at all Not difficult at all Somewhat difficult Not difficult at all       12/24/2022   10:59 AM 12/18/2022   10:04 AM 11/08/2022  2:06 PM  Depression screen PHQ 2/9  Decreased Interest 0 0 0  Down, Depressed, Hopeless 0 0 0  PHQ - 2 Score 0 0 0  Altered sleeping 0 0 0  Tired, decreased energy 1 0 0  Change in appetite 0 0 0  Feeling bad or failure about yourself  0 0 0  Trouble concentrating 1 0 0  Moving slowly or fidgety/restless 0 0 0  Suicidal thoughts 0 0 0  PHQ-9 Score 2 0 0  Difficult doing work/chores Not difficult at all Not difficult at all Not difficult at all    BP Readings from Last 3 Encounters:  12/24/22 118/62  11/08/22 110/70  04/22/22 (!) 124/92    Physical Exam Constitutional:      Appearance: Normal appearance.  Cardiovascular:     Rate and Rhythm: Normal rate. Rhythm irregular.     Heart sounds: No murmur heard. Pulmonary:     Effort: Pulmonary effort is normal.     Breath sounds: Normal breath sounds.  Musculoskeletal:     Cervical back: Normal range of motion.  Skin:    Findings: Lesion (eschar RLE) present.  Neurological:     General: No focal deficit present.     Mental Status: She is alert.     Wt Readings from Last 3 Encounters:  12/24/22 139 lb (63 kg)  12/18/22 139 lb (63 kg)  11/08/22 139 lb (63 kg)    BP 118/62   Pulse 83   Ht 5\' 2"  (1.575 m)   Wt 139 lb (63 kg)   SpO2 98%   BMI 25.42 kg/m   Assessment and Plan:  Problem List Items  Addressed This Visit     Aortic atherosclerosis (HCC)    On appropriate statin therapy      Hypothyroidism due to acquired atrophy of thyroid (Chronic)    Supplemented with slightly high T4 last check. On 88 mcg.  Will recheck labs and advise      Relevant Orders   TSH + free T4   Paroxysmal atrial fibrillation (HCC) (Chronic)    Rate controlled on metoprolol Continue current medications, Eliquis      Acquired thrombophilia (HCC) (Chronic)    On Eliquis with no evidence of bleeding. Addressing anemia currently       Other Visit Diagnoses     Anemia due to vitamin B12 deficiency, unspecified B12 deficiency type    -  Primary   Relevant Orders   Vitamin B12   Noninfected skin tear of right lower extremity, subsequent encounter       healing slowly finish course of Ceftin       No follow-ups on file.   Partially dictated using Dragon software, any errors are not intentional.  Reubin Milan, MD [facility] Medicine [place], [place]

## 2022-12-25 ENCOUNTER — Other Ambulatory Visit: Payer: Self-pay | Admitting: Internal Medicine

## 2022-12-25 DIAGNOSIS — E034 Atrophy of thyroid (acquired): Secondary | ICD-10-CM

## 2022-12-25 LAB — TSH+FREE T4
Free T4: 1.97 ng/dL — ABNORMAL HIGH (ref 0.82–1.77)
TSH: 1.38 u[IU]/mL (ref 0.450–4.500)

## 2022-12-25 LAB — VITAMIN B12: Vitamin B-12: 1488 pg/mL — ABNORMAL HIGH (ref 232–1245)

## 2022-12-25 MED ORDER — LEVOTHYROXINE SODIUM 75 MCG PO TABS
75.0000 ug | ORAL_TABLET | Freq: Every day | ORAL | 1 refills | Status: DC
Start: 2022-12-25 — End: 2023-03-30

## 2023-01-01 ENCOUNTER — Telehealth: Payer: Self-pay | Admitting: Internal Medicine

## 2023-01-01 NOTE — Telephone Encounter (Signed)
DONE

## 2023-01-01 NOTE — Telephone Encounter (Signed)
Copied from CRM [id number]. Topic: General - Other >> Jan 01, 2023  2:15 PM Turkey B wrote: Reason for CRM: pt called n says left drivers license on counter at her appt on Tuesday, and wants to know if anyones seen it.

## 2023-01-15 ENCOUNTER — Other Ambulatory Visit: Payer: Self-pay | Admitting: Internal Medicine

## 2023-01-15 DIAGNOSIS — K2100 Gastro-esophageal reflux disease with esophagitis, without bleeding: Secondary | ICD-10-CM

## 2023-03-17 DIAGNOSIS — M170 Bilateral primary osteoarthritis of knee: Secondary | ICD-10-CM | POA: Diagnosis not present

## 2023-03-20 ENCOUNTER — Other Ambulatory Visit: Payer: Self-pay | Admitting: Internal Medicine

## 2023-03-20 DIAGNOSIS — E034 Atrophy of thyroid (acquired): Secondary | ICD-10-CM

## 2023-03-20 DIAGNOSIS — K2100 Gastro-esophageal reflux disease with esophagitis, without bleeding: Secondary | ICD-10-CM

## 2023-03-20 DIAGNOSIS — E785 Hyperlipidemia, unspecified: Secondary | ICD-10-CM

## 2023-03-20 NOTE — Telephone Encounter (Signed)
Medication Refill - Medication: rosuvastatin (CRESTOR) 5 MG tablet  pantoprazole (PROTONIX) 40 MG tablet  metoprolol succinate (TOPROL-XL) 25 MG 24 hr tablet  levothyroxine (SYNTHROID) 75 MCG tablet  ferrous sulfate 324 MG TBEC   Has the patient contacted their pharmacy? Yes.   (Agent: If no, request that the patient contact the pharmacy for the refill. If patient does not wish to contact the pharmacy document the reason why and proceed with request.) (Agent: If yes, when and what did the pharmacy advise?)  Preferred Pharmacy (with phone number or street name):  [facility] - [place], [place] - [address]  [address] [place] [address]  Phone: [phone] Fax: [phone]   Has the patient been seen for an appointment in the last year OR does the patient have an upcoming appointment? Yes.    Agent: Please be advised that RX refills may take up to 3 business days. We ask that you follow-up with your pharmacy.

## 2023-03-21 NOTE — Telephone Encounter (Signed)
Call to patient to verify change in pharmacy- patient states she has changed her mind and does not want to use mail order- she states she will call back when she needs RF.  Requested Prescriptions  Pending Prescriptions Disp Refills   pantoprazole (PROTONIX) 40 MG tablet 90 tablet 1    Sig: Take 1 tablet (40 mg total) by mouth daily.     Gastroenterology: Proton Pump Inhibitors Passed - 03/20/2023  3:56 PM      Passed - Valid encounter within last 12 months    Recent Outpatient Visits           2 months ago Anemia due to vitamin B12 deficiency, unspecified B12 deficiency type   [place] [facility] at [facility], Nyoka Cowden, MD   4 months ago Chronic heart failure with preserved ejection fraction (HFpEF) [facility])   [place] [facility] at [facility], Nyoka Cowden, MD   11 months ago AKI (acute kidney injury) [facility])   [place] [facility] Medicine at [facility], Nyoka Cowden, MD   1 year ago Colitis   [facility] at [facility], Nyoka Cowden, MD   1 year ago Acute colitis   [facility] Primary [facility] at [facility], Nyoka Cowden, MD               rosuvastatin (CRESTOR) 5 MG tablet 90 tablet 3    Sig: Take 1 tablet (5 mg total) by mouth daily.     Cardiovascular:  Antilipid - Statins 2 Failed - 03/20/2023  3:56 PM      Failed - Lipid Panel in normal range within the last 12 months    Cholesterol, Total  Date Value Ref Range Status  12/25/2021 157 100 - 199 mg/dL Final   LDL Chol Calc (NIH)  Date Value Ref Range Status  12/25/2021 73 0 - 99 mg/dL Final   HDL  Date Value Ref Range Status  12/25/2021 69 >39 mg/dL Final   Triglycerides  Date Value Ref Range Status  12/25/2021 81 0 - 149 mg/dL Final         Passed - Cr in normal range and within 360 days    Creatinine  Date Value Ref Range Status  12/24/2022 0.8 0.5 - 1.1  Final   Creatinine, Ser  Date Value Ref Range Status  04/22/2022 1.17 (H) 0.57 - 1.00 mg/dL Final         Passed - Patient is not pregnant      Passed - Valid encounter within last 12 months    Recent Outpatient Visits           2 months ago Anemia due to vitamin B12 deficiency, unspecified B12 deficiency type   [place] [facility] at [facility], Nyoka Cowden, MD   4 months ago Chronic heart failure with preserved ejection fraction (HFpEF) [facility])   [place] [facility] Medicine at [facility], Nyoka Cowden, MD   11 months ago AKI (acute kidney injury) [facility])    Primary Care & Sports Medicine at [facility], Nyoka Cowden, MD   1 year ago Colitis   [facility] & [facility] Medicine at [facility], Nyoka Cowden, MD   1 year ago Acute colitis   [facility] Primary Care & [facility] Medicine at [facility]  Reubin Milan, MD               metoprolol succinate (TOPROL-XL) 50 MG 24 hr tablet      Sig: Take 1 tablet (50 mg total) by mouth daily.     Cardiovascular:  Beta Blockers Passed - 03/20/2023  3:56 PM      Passed - Last BP in normal range    BP Readings from Last 1 Encounters:  12/24/22 118/62         Passed - Last Heart Rate in normal range    Pulse Readings from Last 1 Encounters:  12/24/22 83         Passed - Valid encounter within last 6 months    Recent Outpatient Visits           2 months ago Anemia due to vitamin B12 deficiency, unspecified B12 deficiency type   [place] [facility] at [facility], Nyoka Cowden, MD   4 months ago Chronic heart failure with preserved ejection fraction (HFpEF) [facility])   [place] [facility] Medicine at [facility], Nyoka Cowden, MD   11 months ago AKI (acute kidney injury) [facility])   [place] [facility] Medicine at [facility], Nyoka Cowden, MD   1 year ago  Colitis   [facility] Primary Care & Sports Medicine at [facility], Nyoka Cowden, MD   1 year ago Acute colitis   [facility] Primary Care & Sports Medicine at [facility], Nyoka Cowden, MD               levothyroxine (SYNTHROID) 75 MCG tablet 90 tablet 1    Sig: Take 1 tablet (75 mcg total) by mouth daily.     Endocrinology:  Hypothyroid Agents Passed - 03/20/2023  3:56 PM      Passed - TSH in normal range and within 360 days    TSH  Date Value Ref Range Status  12/24/2022 1.380 0.450 - 4.500 uIU/mL Final         Passed - Valid encounter within last 12 months    Recent Outpatient Visits           2 months ago Anemia due to vitamin B12 deficiency, unspecified B12 deficiency type   [place] [facility] Medicine at [facility], Nyoka Cowden, MD   4 months ago Chronic heart failure with preserved ejection fraction (HFpEF) [facility])   [place] [facility] Sports Medicine at [facility], Nyoka Cowden, MD   11 months ago AKI (acute kidney injury) [facility])    Primary Care & Sports Medicine at [facility], Nyoka Cowden, MD   1 year ago Colitis   [facility] Primary Care & Sports Medicine at [facility], Nyoka Cowden, MD   1 year ago Acute colitis   [facility] Primary Care & Sports Medicine at [facility], Nyoka Cowden, MD               ferrous sulfate 324 MG TBEC 30 tablet     Sig: Take 1 tablet (324 mg total) by mouth.     Endocrinology:  Minerals - Iron Supplementation Failed - 03/20/2023  3:56 PM      Failed - HGB in normal range and within 360 days    Hemoglobin  Date Value Ref Range Status  12/24/2022 9.8 (A) 12.0 - 16.0 Final  01/29/2022 11.2  11.1 - 15.9 g/dL Final         Failed - HCT in normal range and within 360 days    HCT  Date Value Ref Range Status  12/24/2022 34 (A) 36 - 46 Final   Hematocrit  Date Value Ref Range Status  01/29/2022 36.3 34.0 - 46.6 % Final          Failed - RBC in normal range and within 360 days    RBC  Date Value Ref Range Status  01/29/2022 3.90 3.77 - 5.28 x10E6/uL Final         Passed - Fe (serum) in normal range and within 360 days    Iron  Date Value Ref Range Status  10/29/2022 9  Final         Passed - Ferritin in normal range and within 360 days    Ferritin  Date Value Ref Range Status  10/29/2022 38  Final         Passed - Valid encounter within last 12 months    Recent Outpatient Visits           2 months ago Anemia due to vitamin B12 deficiency, unspecified B12 deficiency type   [place] [facility] at [facility], Nyoka Cowden, MD   4 months ago Chronic heart failure with preserved ejection fraction (HFpEF) [facility])   [place] [facility] Sports Medicine at [facility], Nyoka Cowden, MD   11 months ago AKI (acute kidney injury) [facility])   [place] [facility] Medicine at [facility], Nyoka Cowden, MD   1 year ago Colitis   [facility] at [facility], Nyoka Cowden, MD   1 year ago Acute colitis   [facility] Primary Care & Sports Medicine at [facility], Nyoka Cowden, MD

## 2023-03-28 ENCOUNTER — Other Ambulatory Visit: Payer: Self-pay | Admitting: Internal Medicine

## 2023-03-28 DIAGNOSIS — E034 Atrophy of thyroid (acquired): Secondary | ICD-10-CM

## 2023-03-31 DIAGNOSIS — G894 Chronic pain syndrome: Secondary | ICD-10-CM | POA: Diagnosis not present

## 2023-03-31 DIAGNOSIS — M5136 Other intervertebral disc degeneration, lumbar region: Secondary | ICD-10-CM | POA: Diagnosis not present

## 2023-03-31 DIAGNOSIS — T402X5A Adverse effect of other opioids, initial encounter: Secondary | ICD-10-CM | POA: Diagnosis not present

## 2023-03-31 DIAGNOSIS — F411 Generalized anxiety disorder: Secondary | ICD-10-CM | POA: Diagnosis not present

## 2023-03-31 DIAGNOSIS — Z79899 Other long term (current) drug therapy: Secondary | ICD-10-CM | POA: Diagnosis not present

## 2023-03-31 DIAGNOSIS — Z86018 Personal history of other benign neoplasm: Secondary | ICD-10-CM | POA: Diagnosis not present

## 2023-03-31 DIAGNOSIS — M47896 Other spondylosis, lumbar region: Secondary | ICD-10-CM | POA: Diagnosis not present

## 2023-03-31 DIAGNOSIS — M170 Bilateral primary osteoarthritis of knee: Secondary | ICD-10-CM | POA: Diagnosis not present

## 2023-03-31 DIAGNOSIS — M533 Sacrococcygeal disorders, not elsewhere classified: Secondary | ICD-10-CM | POA: Diagnosis not present

## 2023-04-03 DIAGNOSIS — M170 Bilateral primary osteoarthritis of knee: Secondary | ICD-10-CM | POA: Diagnosis not present

## 2023-04-15 DIAGNOSIS — I10 Essential (primary) hypertension: Secondary | ICD-10-CM | POA: Diagnosis not present

## 2023-04-15 DIAGNOSIS — I480 Paroxysmal atrial fibrillation: Secondary | ICD-10-CM | POA: Diagnosis not present

## 2023-04-15 DIAGNOSIS — E7849 Other hyperlipidemia: Secondary | ICD-10-CM | POA: Diagnosis not present

## 2023-04-21 DIAGNOSIS — J3489 Other specified disorders of nose and nasal sinuses: Secondary | ICD-10-CM | POA: Diagnosis not present

## 2023-04-21 DIAGNOSIS — R040 Epistaxis: Secondary | ICD-10-CM | POA: Diagnosis not present

## 2023-04-30 DIAGNOSIS — Z79899 Other long term (current) drug therapy: Secondary | ICD-10-CM | POA: Diagnosis not present

## 2023-04-30 DIAGNOSIS — Z5181 Encounter for therapeutic drug level monitoring: Secondary | ICD-10-CM | POA: Diagnosis not present

## 2023-05-06 DIAGNOSIS — Z23 Encounter for immunization: Secondary | ICD-10-CM | POA: Diagnosis not present

## 2023-05-06 DIAGNOSIS — J4530 Mild persistent asthma, uncomplicated: Secondary | ICD-10-CM | POA: Diagnosis not present

## 2023-06-03 DIAGNOSIS — F1123 Opioid dependence with withdrawal: Secondary | ICD-10-CM | POA: Diagnosis not present

## 2023-06-03 DIAGNOSIS — T402X5A Adverse effect of other opioids, initial encounter: Secondary | ICD-10-CM | POA: Diagnosis not present

## 2023-06-03 DIAGNOSIS — Z86018 Personal history of other benign neoplasm: Secondary | ICD-10-CM | POA: Diagnosis not present

## 2023-06-03 DIAGNOSIS — F411 Generalized anxiety disorder: Secondary | ICD-10-CM | POA: Diagnosis not present

## 2023-06-03 DIAGNOSIS — Z79899 Other long term (current) drug therapy: Secondary | ICD-10-CM | POA: Diagnosis not present

## 2023-06-03 DIAGNOSIS — M47896 Other spondylosis, lumbar region: Secondary | ICD-10-CM | POA: Diagnosis not present

## 2023-06-03 DIAGNOSIS — M170 Bilateral primary osteoarthritis of knee: Secondary | ICD-10-CM | POA: Diagnosis not present

## 2023-06-03 DIAGNOSIS — M533 Sacrococcygeal disorders, not elsewhere classified: Secondary | ICD-10-CM | POA: Diagnosis not present

## 2023-06-03 DIAGNOSIS — G894 Chronic pain syndrome: Secondary | ICD-10-CM | POA: Diagnosis not present

## 2023-06-06 DIAGNOSIS — R112 Nausea with vomiting, unspecified: Secondary | ICD-10-CM | POA: Diagnosis not present

## 2023-06-06 DIAGNOSIS — E161 Other hypoglycemia: Secondary | ICD-10-CM | POA: Diagnosis not present

## 2023-06-06 DIAGNOSIS — J45909 Unspecified asthma, uncomplicated: Secondary | ICD-10-CM | POA: Diagnosis not present

## 2023-06-06 DIAGNOSIS — R197 Diarrhea, unspecified: Secondary | ICD-10-CM | POA: Diagnosis not present

## 2023-06-06 DIAGNOSIS — Z87891 Personal history of nicotine dependence: Secondary | ICD-10-CM | POA: Diagnosis not present

## 2023-06-06 DIAGNOSIS — I10 Essential (primary) hypertension: Secondary | ICD-10-CM | POA: Diagnosis not present

## 2023-06-06 DIAGNOSIS — I4891 Unspecified atrial fibrillation: Secondary | ICD-10-CM | POA: Diagnosis not present

## 2023-06-06 DIAGNOSIS — F19930 Other psychoactive substance use, unspecified with withdrawal, uncomplicated: Secondary | ICD-10-CM | POA: Diagnosis not present

## 2023-06-06 DIAGNOSIS — K219 Gastro-esophageal reflux disease without esophagitis: Secondary | ICD-10-CM | POA: Diagnosis not present

## 2023-06-06 DIAGNOSIS — E860 Dehydration: Secondary | ICD-10-CM | POA: Diagnosis not present

## 2023-06-06 DIAGNOSIS — R531 Weakness: Secondary | ICD-10-CM | POA: Diagnosis not present

## 2023-07-02 DIAGNOSIS — M533 Sacrococcygeal disorders, not elsewhere classified: Secondary | ICD-10-CM | POA: Diagnosis not present

## 2023-07-02 DIAGNOSIS — F411 Generalized anxiety disorder: Secondary | ICD-10-CM | POA: Diagnosis not present

## 2023-07-02 DIAGNOSIS — T402X5A Adverse effect of other opioids, initial encounter: Secondary | ICD-10-CM | POA: Diagnosis not present

## 2023-07-02 DIAGNOSIS — M47896 Other spondylosis, lumbar region: Secondary | ICD-10-CM | POA: Diagnosis not present

## 2023-07-02 DIAGNOSIS — Z79899 Other long term (current) drug therapy: Secondary | ICD-10-CM | POA: Diagnosis not present

## 2023-07-02 DIAGNOSIS — G894 Chronic pain syndrome: Secondary | ICD-10-CM | POA: Diagnosis not present

## 2023-07-02 DIAGNOSIS — F1123 Opioid dependence with withdrawal: Secondary | ICD-10-CM | POA: Diagnosis not present

## 2023-07-02 DIAGNOSIS — Z86018 Personal history of other benign neoplasm: Secondary | ICD-10-CM | POA: Diagnosis not present

## 2023-07-18 ENCOUNTER — Other Ambulatory Visit: Payer: Self-pay | Admitting: Internal Medicine

## 2023-07-18 DIAGNOSIS — K2100 Gastro-esophageal reflux disease with esophagitis, without bleeding: Secondary | ICD-10-CM

## 2023-07-18 NOTE — Telephone Encounter (Signed)
Requested Prescriptions  Pending Prescriptions Disp Refills   pantoprazole (PROTONIX) 40 MG tablet [Pharmacy Med Name: PANTOPRAZOLE SOD DR 40 MG TAB] 90 tablet 1    Sig: TAKE 1 TABLET BY MOUTH DAILY     Gastroenterology: Proton Pump Inhibitors Passed - 07/18/2023 10:45 AM      Passed - Valid encounter within last 12 months    Recent Outpatient Visits           6 months ago Anemia due to vitamin B12 deficiency, unspecified B12 deficiency type   [place] [facility] Medicine at [facility], Nyoka Cowden, MD   8 months ago Chronic heart failure with preserved ejection fraction (HFpEF) [facility])   [place] [facility] Sports Medicine at [facility], Nyoka Cowden, MD   1 year ago AKI (acute kidney injury) [facility])   [place] [facility] at [facility], Nyoka Cowden, MD   1 year ago Colitis   [facility] at [facility], Nyoka Cowden, MD   1 year ago Acute colitis   [facility] Primary Care & Sports Medicine at [facility], Nyoka Cowden, MD

## 2023-09-18 DIAGNOSIS — D485 Neoplasm of uncertain behavior of skin: Secondary | ICD-10-CM | POA: Diagnosis not present

## 2023-09-18 DIAGNOSIS — C44329 Squamous cell carcinoma of skin of other parts of face: Secondary | ICD-10-CM | POA: Diagnosis not present

## 2023-10-06 ENCOUNTER — Other Ambulatory Visit: Payer: Self-pay | Admitting: Internal Medicine

## 2023-10-06 NOTE — Progress Notes (Unsigned)
 Date:  10/06/2023   Name:  Crystal Zavala   DOB:  27-Dec-1931   MRN:  [id number]   Chief Complaint: No chief complaint on file.  HPI  Review of Systems   Lab Results  Component Value Date   NA 139 12/24/2022   K 4.3 12/24/2022   CO2 19 (L) 04/22/2022   GLUCOSE 91 04/22/2022   BUN 26 (A) 12/24/2022   CREATININE 0.8 12/24/2022   CALCIUM 1.3 (A) 12/24/2022   EGFR 45 (L) 04/22/2022   GFRNONAA 55 04/23/2021   Lab Results  Component Value Date   CHOL 157 12/25/2021   HDL 69 12/25/2021   LDLCALC 73 12/25/2021   TRIG 81 12/25/2021   CHOLHDL 2.3 12/25/2021   Lab Results  Component Value Date   TSH 1.380 12/24/2022   Lab Results  Component Value Date   HGBA1C 5.2 04/22/2017   Lab Results  Component Value Date   WBC 9.0 12/24/2022   HGB 9.8 (A) 12/24/2022   HCT 34 (A) 12/24/2022   MCV 93 01/29/2022   PLT 358 12/24/2022   Lab Results  Component Value Date   ALT 13 12/25/2021   AST 18 12/25/2021   ALKPHOS 100 12/25/2021   BILITOT 0.5 12/25/2021   No results found for: "25OHVITD2", "25OHVITD3", "VD25OH"   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   Spondylosis of lumbar region without myelopathy or radiculopathy 10/21/2016   Hypothyroidism due to acquired atrophy of thyroid 04/22/2016   Primary insomnia 04/22/2016   Aortic atherosclerosis (HCC) 01/18/2015   Arthritis of neck 01/18/2015   Dermatitis, eczematoid 01/18/2015    Essential (primary) hypertension 01/18/2015   Gastroesophageal reflux disease with esophagitis 01/18/2015   H/O adenomatous polyp of colon 01/18/2015   Migraine without aura and responsive to treatment 01/18/2015   Mild intermittent asthma without complication 01/18/2015   Hyperlipidemia, mild 01/18/2015   Lung nodule, multiple 01/18/2015   Acquired cyst of kidney 01/18/2015   Tobacco use disorder, moderate, in sustained remission 01/18/2015   Temporary cerebral vascular dysfunction 01/18/2015   Dysphagia 10/26/2013    Allergies  Allergen Reactions   Amlodipine Swelling   Tramadol Nausea Only and Rash   Ace Inhibitors Cough    Past Surgical History:  Procedure Laterality Date   ABDOMINAL HYSTERECTOMY     BLEPHAROPLASTY Bilateral 2013   BREAST BIOPSY Left 2009   benign   COLONOSCOPY  2006   Normal   ESI  01/2017   L5-S1 @ [facility]   ESOPHAGOGASTRODUODENOSCOPY  2015   Dilation of esophageal stricture   TOTAL HIP ARTHROPLASTY Right 2019    Social History   Tobacco Use   Smoking status: Former    Current packs/day: 0.00    Average packs/day: 0.5 packs/day for 39.0 years (19.5 ttl pk-yrs)    Types: Cigarettes    Start date: 36    Quit date: 75    Years since quitting: 33.1   Smokeless tobacco: Never   Tobacco comments:    smoking  cessation materails not required  Vaping Use   Vaping status: Never Used  Substance Use Topics   Alcohol use: No    Alcohol/week: 0.0 standard drinks of alcohol   Drug use: No     Medication list has been reviewed and updated.  No outpatient medications have been marked as taking for the 10/06/23 encounter (Orders Only) with Reubin Milan, MD.       12/24/2022   10:59 AM 11/08/2022    2:06 PM 04/22/2022    1:47 PM 03/21/2022    7:59 AM  GAD 7 : Generalized Anxiety Score  Nervous, Anxious, on Edge 0 0 3 0  Control/stop worrying 0 0 3 1  Worry too much - different things 1 0 3 1  Trouble relaxing 0 0 3 1  Restless 0 0 3 0   Easily annoyed or irritable 0 0 1 0  Afraid - awful might happen 0 0 1 0  Total GAD 7 Score 1 0 17 3  Anxiety Difficulty Not difficult at all Not difficult at all Somewhat difficult Not difficult at all       12/24/2022   10:59 AM 12/18/2022   10:04 AM 11/08/2022    2:06 PM  Depression screen PHQ 2/9  Decreased Interest 0 0 0  Down, Depressed, Hopeless 0 0 0  PHQ - 2 Score 0 0 0  Altered sleeping 0 0 0  Tired, decreased energy 1 0 0  Change in appetite 0 0 0  Feeling bad or failure about yourself  0 0 0  Trouble concentrating 1 0 0  Moving slowly or fidgety/restless 0 0 0  Suicidal thoughts 0 0 0  PHQ-9 Score 2 0 0  Difficult doing work/chores Not difficult at all Not difficult at all Not difficult at all    BP Readings from Last 3 Encounters:  12/24/22 118/62  11/08/22 110/70  04/22/22 (!) 124/92    Physical Exam  Wt Readings from Last 3 Encounters:  12/24/22 139 lb (63 kg)  12/18/22 139 lb (63 kg)  11/08/22 139 lb (63 kg)    There were no vitals taken for this visit.  Assessment and Plan:  Problem List Items Addressed This Visit   None   No follow-ups on file.    Reubin Milan, MD [facility] [place]

## 2023-10-08 ENCOUNTER — Ambulatory Visit: Payer: No Typology Code available for payment source | Admitting: Internal Medicine

## 2023-10-13 DIAGNOSIS — E7849 Other hyperlipidemia: Secondary | ICD-10-CM | POA: Diagnosis not present

## 2023-10-13 DIAGNOSIS — I480 Paroxysmal atrial fibrillation: Secondary | ICD-10-CM | POA: Diagnosis not present

## 2023-10-13 DIAGNOSIS — I10 Essential (primary) hypertension: Secondary | ICD-10-CM | POA: Diagnosis not present

## 2023-10-13 DIAGNOSIS — J4530 Mild persistent asthma, uncomplicated: Secondary | ICD-10-CM | POA: Diagnosis not present

## 2023-10-21 ENCOUNTER — Ambulatory Visit (INDEPENDENT_AMBULATORY_CARE_PROVIDER_SITE_OTHER): Admitting: Internal Medicine

## 2023-10-21 ENCOUNTER — Encounter: Payer: Self-pay | Admitting: Internal Medicine

## 2023-10-21 VITALS — BP 118/68 | HR 63 | Ht 62.0 in | Wt 132.0 lb

## 2023-10-21 DIAGNOSIS — I10 Essential (primary) hypertension: Secondary | ICD-10-CM | POA: Diagnosis not present

## 2023-10-21 DIAGNOSIS — I480 Paroxysmal atrial fibrillation: Secondary | ICD-10-CM

## 2023-10-21 DIAGNOSIS — E034 Atrophy of thyroid (acquired): Secondary | ICD-10-CM | POA: Diagnosis not present

## 2023-10-21 DIAGNOSIS — I5032 Chronic diastolic (congestive) heart failure: Secondary | ICD-10-CM | POA: Insufficient documentation

## 2023-10-21 NOTE — Progress Notes (Signed)
 Date:  10/21/2023   Name:  Crystal Zavala   DOB:  1932/01/11   MRN:  [id number]   Chief Complaint: Hypertension and Hypothyroidism  Hypertension This is a chronic problem. The problem is controlled. Pertinent negatives include no chest pain, headaches, palpitations or shortness of breath. Past treatments include beta blockers, diuretics and central alpha agonists. The current treatment provides significant improvement. There are no compliance problems.  Hypertensive end-organ damage includes kidney disease. There is no history of CAD/MI or CVA. Identifiable causes of hypertension include a thyroid problem.  Thyroid Problem Presents for follow-up (dose changed last visit) visit. Patient reports no anxiety, fatigue or palpitations. The symptoms have been stable.    Review of Systems  Constitutional:  Negative for chills, fatigue and fever.  HENT:  Negative for trouble swallowing.   Respiratory:  Negative for chest tightness and shortness of breath.   Cardiovascular:  Negative for chest pain, palpitations and leg swelling.  Musculoskeletal:  Positive for arthralgias and gait problem (uses rolling walker).  Neurological:  Negative for dizziness and headaches.  Hematological:  Does not bruise/bleed easily.  Psychiatric/Behavioral:  Negative for dysphoric mood and sleep disturbance. The patient is not nervous/anxious.      Lab Results  Component Value Date   NA 139 12/24/2022   K 4.3 12/24/2022   CO2 19 (L) 04/22/2022   GLUCOSE 91 04/22/2022   BUN 26 (A) 12/24/2022   CREATININE 0.8 12/24/2022   CALCIUM 1.3 (A) 12/24/2022   EGFR 45 (L) 04/22/2022   GFRNONAA 55 04/23/2021   Lab Results  Component Value Date   CHOL 157 12/25/2021   HDL 69 12/25/2021   LDLCALC 73 12/25/2021   TRIG 81 12/25/2021   CHOLHDL 2.3 12/25/2021   Lab Results  Component Value Date   TSH 1.380 12/24/2022   Lab Results  Component Value Date   HGBA1C 5.2 04/22/2017   Lab Results  Component Value Date    WBC 9.0 12/24/2022   HGB 9.8 (A) 12/24/2022   HCT 34 (A) 12/24/2022   MCV 93 01/29/2022   PLT 358 12/24/2022   Lab Results  Component Value Date   ALT 13 12/25/2021   AST 18 12/25/2021   ALKPHOS 100 12/25/2021   BILITOT 0.5 12/25/2021   No results found for: "25OHVITD2", "25OHVITD3", "VD25OH"   Patient Active Problem List   Diagnosis Date Noted   Chronic heart failure with preserved ejection fraction (HFpEF) (HCC) 10/21/2023   Benign neoplasm of peripheral, sympathetic, and parasympathetic nerves and ganglia 11/21/2022   Long term (current) use of anticoagulants 05/15/2022   Presence of right artificial hip joint 05/15/2022   Cerebrovascular accident (CVA) (HCC) 05/10/2020   Decreased functional activity tolerance 02/01/2020   Acquired thrombophilia (HCC) 01/18/2020   Paroxysmal atrial fibrillation (HCC) 02/23/2019   Pain of both hip joints 04/22/2017   Renal insufficiency 01/05/2017   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   Spondylosis of lumbar region without myelopathy or radiculopathy 10/21/2016   Hypothyroidism due to acquired atrophy of thyroid 04/22/2016   Primary insomnia 04/22/2016   Aortic atherosclerosis (HCC) 01/18/2015   Arthritis of neck 01/18/2015   Dermatitis, eczematoid 01/18/2015   Essential (primary) hypertension 01/18/2015   Gastroesophageal reflux disease with esophagitis 01/18/2015   H/O adenomatous polyp of colon 01/18/2015   Migraine without aura and responsive to treatment 01/18/2015   Mild intermittent asthma without complication 01/18/2015   Hyperlipidemia, mild 01/18/2015   Lung nodule, multiple 01/18/2015  Acquired cyst of kidney 01/18/2015   Tobacco use disorder, moderate, in sustained remission 01/18/2015   Temporary cerebral vascular dysfunction 01/18/2015   Dysphagia 10/26/2013    Allergies  Allergen Reactions   Amlodipine Swelling   Tramadol Nausea Only and Rash   Ace Inhibitors Cough    Past  Surgical History:  Procedure Laterality Date   ABDOMINAL HYSTERECTOMY     BLEPHAROPLASTY Bilateral 2013   BREAST BIOPSY Left 2009   benign   COLONOSCOPY  2006   Normal   ESI  01/2017   L5-S1 @ [facility]   ESOPHAGOGASTRODUODENOSCOPY  2015   Dilation of esophageal stricture   TOTAL HIP ARTHROPLASTY Right 2019    Social History   Tobacco Use   Smoking status: Former    Current packs/day: 0.00    Average packs/day: 0.5 packs/day for 39.0 years (19.5 ttl pk-yrs)    Types: Cigarettes    Start date: 95    Quit date: 72    Years since quitting: 33.2   Smokeless tobacco: Never   Tobacco comments:    smoking cessation materails not required  Vaping Use   Vaping status: Never Used  Substance Use Topics   Alcohol use: No    Alcohol/week: 0.0 standard drinks of alcohol   Drug use: No     Medication list has been reviewed and updated.  Current Meds  Medication Sig   albuterol (VENTOLIN HFA) 108 (90 Base) MCG/ACT inhaler Inhale into the lungs every 6 (six) hours as needed for wheezing or shortness of breath.   cetirizine (ZYRTEC) 10 MG tablet Take 10 mg by mouth daily.   cyanocobalamin (VITAMIN B12) 1000 MCG tablet Take 1 tablet (1,000 mcg total) by mouth daily.   ELIQUIS 2.5 MG TABS tablet Take 2.5 mg by mouth 2 (two) times daily.   fluticasone-salmeterol (ADVAIR) 100-50 MCG/ACT AEPB Inhale 1 puff into the lungs 2 (two) times daily.   furosemide (LASIX) 40 MG tablet Take 40 mg by mouth daily as needed.   hydrALAZINE (APRESOLINE) 25 MG tablet Take 25 mg by mouth in the morning and at bedtime.   levothyroxine (SYNTHROID) 75 MCG tablet TAKE 1 TABLET BY MOUTH DAILY   metoprolol succinate (TOPROL-XL) 25 MG 24 hr tablet Take 1 tablet by mouth daily.   pantoprazole (PROTONIX) 40 MG tablet TAKE 1 TABLET BY MOUTH DAILY   rosuvastatin (CRESTOR) 5 MG tablet TAKE ONE TABLET BY MOUTH DAILY   spironolactone (ALDACTONE) 25 MG tablet Take 25 mg by mouth once.   Vitamin D, Cholecalciferol, 50  MCG (2000 UT) CAPS Take 2 capsules by mouth daily.   [DISCONTINUED] ELIQUIS 5 MG TABS tablet Take 5 mg by mouth 2 (two) times daily.   [DISCONTINUED] metoprolol succinate (TOPROL-XL) 50 MG 24 hr tablet Take 50 mg by mouth daily.       10/21/2023    3:51 PM 12/24/2022   10:59 AM 11/08/2022    2:06 PM 04/22/2022    1:47 PM  GAD 7 : Generalized Anxiety Score  Nervous, Anxious, on Edge 0 0 0 3  Control/stop worrying 0 0 0 3  Worry too much - different things 0 1 0 3  Trouble relaxing 0 0 0 3  Restless 0 0 0 3  Easily annoyed or irritable 0 0 0 1  Afraid - awful might happen 0 0 0 1  Total GAD 7 Score 0 1 0 17  Anxiety Difficulty Not difficult at all Not difficult at all Not difficult at all Somewhat  difficult       10/21/2023    3:51 PM 12/24/2022   10:59 AM 12/18/2022   10:04 AM  Depression screen PHQ 2/9  Decreased Interest 0 0 0  Down, Depressed, Hopeless 0 0 0  PHQ - 2 Score 0 0 0  Altered sleeping 0 0 0  Tired, decreased energy 1 1 0  Change in appetite 0 0 0  Feeling bad or failure about yourself  0 0 0  Trouble concentrating 1 1 0  Moving slowly or fidgety/restless 0 0 0  Suicidal thoughts 0 0 0  PHQ-9 Score 2 2 0  Difficult doing work/chores Not difficult at all Not difficult at all Not difficult at all    BP Readings from Last 3 Encounters:  10/21/23 118/68  12/24/22 118/62  11/08/22 110/70    Physical Exam Vitals and nursing note reviewed.  Constitutional:      General: She is not in acute distress.    Appearance: Normal appearance. She is well-developed.  HENT:     Head: Normocephalic and atraumatic.  Cardiovascular:     Rate and Rhythm: Normal rate and regular rhythm.  Pulmonary:     Effort: Pulmonary effort is normal. No respiratory distress.     Breath sounds: No wheezing or rhonchi.  Musculoskeletal:     Cervical back: Normal range of motion.     Right lower leg: No edema.     Left lower leg: No edema.  Lymphadenopathy:     Cervical: No cervical  adenopathy.  Skin:    General: Skin is warm and dry.     Findings: No rash.  Neurological:     General: No focal deficit present.     Mental Status: She is alert and oriented to person, place, and time.  Psychiatric:        Mood and Affect: Mood normal.        Behavior: Behavior normal.     Wt Readings from Last 3 Encounters:  10/21/23 132 lb (59.9 kg)  12/24/22 139 lb (63 kg)  12/18/22 139 lb (63 kg)    BP 118/68   Pulse 63   Ht 5\' 2"  (1.575 m)   Wt 132 lb (59.9 kg)   SpO2 98%   BMI 24.14 kg/m   Assessment and Plan:  Problem List Items Addressed This Visit       Unprioritized   Essential (primary) hypertension - Primary (Chronic)   Controlled BP with normal exam. Current regimen is metoprolol, spironolactone, hydralazine and prn Lasix. Will continue same medications; encourage continued reduced sodium diet.       Relevant Medications   spironolactone (ALDACTONE) 25 MG tablet   ELIQUIS 2.5 MG TABS tablet   metoprolol succinate (TOPROL-XL) 25 MG 24 hr tablet   hydrALAZINE (APRESOLINE) 25 MG tablet   furosemide (LASIX) 40 MG tablet   Other Relevant Orders   CBC with Differential/Platelet   Comprehensive metabolic panel   Hypothyroidism due to acquired atrophy of thyroid (Chronic)   Relevant Medications   metoprolol succinate (TOPROL-XL) 25 MG 24 hr tablet   Other Relevant Orders   TSH + free T4   Paroxysmal atrial fibrillation (HCC) (Chronic)   Controlled BP with normal exam.  In SR today Current regimen is metoprolol. Will continue same medications; encourage continued reduced sodium diet.       Relevant Medications   spironolactone (ALDACTONE) 25 MG tablet   ELIQUIS 2.5 MG TABS tablet   metoprolol succinate (TOPROL-XL) 25 MG 24  hr tablet   hydrALAZINE (APRESOLINE) 25 MG tablet   furosemide (LASIX) 40 MG tablet   Chronic heart failure with preserved ejection fraction (HFpEF) (HCC)   Followed by Cardiology - seen last week. Now on reduced dose  Eliquis, metoprolol, prn lasix and spironolactone      Relevant Medications   spironolactone (ALDACTONE) 25 MG tablet   ELIQUIS 2.5 MG TABS tablet   metoprolol succinate (TOPROL-XL) 25 MG 24 hr tablet   hydrALAZINE (APRESOLINE) 25 MG tablet   furosemide (LASIX) 40 MG tablet    Return in about 4 months (around 02/20/2024) for CPX.    Reubin Milan, MD [facility] [place]

## 2023-10-21 NOTE — Assessment & Plan Note (Addendum)
 Followed by Cardiology - seen last week. Now on reduced dose Eliquis, metoprolol, prn lasix and spironolactone

## 2023-10-21 NOTE — Patient Instructions (Addendum)
 Resume vitamin B12 three times per week  Resume iron supplement three times per week

## 2023-10-21 NOTE — Assessment & Plan Note (Signed)
 Controlled BP with normal exam.  In SR today Current regimen is metoprolol. Will continue same medications; encourage continued reduced sodium diet.

## 2023-10-21 NOTE — Assessment & Plan Note (Signed)
 Controlled BP with normal exam. Current regimen is metoprolol, spironolactone, hydralazine and prn Lasix. Will continue same medications; encourage continued reduced sodium diet.

## 2023-10-22 ENCOUNTER — Encounter: Payer: Self-pay | Admitting: Internal Medicine

## 2023-10-22 LAB — CBC WITH DIFFERENTIAL/PLATELET
Basophils Absolute: 0 10*3/uL (ref 0.0–0.2)
Basos: 1 %
EOS (ABSOLUTE): 0.1 10*3/uL (ref 0.0–0.4)
Eos: 1 %
Hematocrit: 33.8 % — ABNORMAL LOW (ref 34.0–46.6)
Hemoglobin: 10.7 g/dL — ABNORMAL LOW (ref 11.1–15.9)
Immature Grans (Abs): 0 10*3/uL (ref 0.0–0.1)
Immature Granulocytes: 0 %
Lymphocytes Absolute: 1.2 10*3/uL (ref 0.7–3.1)
Lymphs: 18 %
MCH: 28.5 pg (ref 26.6–33.0)
MCHC: 31.7 g/dL (ref 31.5–35.7)
MCV: 90 fL (ref 79–97)
Monocytes Absolute: 0.6 10*3/uL (ref 0.1–0.9)
Monocytes: 9 %
Neutrophils Absolute: 4.8 10*3/uL (ref 1.4–7.0)
Neutrophils: 71 %
Platelets: 330 10*3/uL (ref 150–450)
RBC: 3.75 x10E6/uL — ABNORMAL LOW (ref 3.77–5.28)
RDW: 12.8 % (ref 11.7–15.4)
WBC: 6.7 10*3/uL (ref 3.4–10.8)

## 2023-10-22 LAB — COMPREHENSIVE METABOLIC PANEL
ALT: 7 IU/L (ref 0–32)
AST: 10 IU/L (ref 0–40)
Albumin: 3.8 g/dL (ref 3.6–4.6)
Alkaline Phosphatase: 111 IU/L (ref 44–121)
BUN/Creatinine Ratio: 19 (ref 12–28)
BUN: 16 mg/dL (ref 10–36)
Bilirubin Total: 0.3 mg/dL (ref 0.0–1.2)
CO2: 23 mmol/L (ref 20–29)
Calcium: 9.8 mg/dL (ref 8.7–10.3)
Chloride: 105 mmol/L (ref 96–106)
Creatinine, Ser: 0.83 mg/dL (ref 0.57–1.00)
Globulin, Total: 2.3 g/dL (ref 1.5–4.5)
Glucose: 94 mg/dL (ref 70–99)
Potassium: 4.8 mmol/L (ref 3.5–5.2)
Sodium: 138 mmol/L (ref 134–144)
Total Protein: 6.1 g/dL (ref 6.0–8.5)
eGFR: 67 mL/min/{1.73_m2} (ref >59)

## 2023-10-22 LAB — TSH+FREE T4
Free T4: 1.73 ng/dL (ref 0.82–1.77)
TSH: 0.75 u[IU]/mL (ref 0.450–4.500)

## 2023-10-27 ENCOUNTER — Ambulatory Visit: Admitting: Internal Medicine

## 2023-10-29 DIAGNOSIS — M533 Sacrococcygeal disorders, not elsewhere classified: Secondary | ICD-10-CM | POA: Diagnosis not present

## 2023-10-29 DIAGNOSIS — Z79899 Other long term (current) drug therapy: Secondary | ICD-10-CM | POA: Diagnosis not present

## 2023-10-29 DIAGNOSIS — M47896 Other spondylosis, lumbar region: Secondary | ICD-10-CM | POA: Diagnosis not present

## 2023-10-29 DIAGNOSIS — F411 Generalized anxiety disorder: Secondary | ICD-10-CM | POA: Diagnosis not present

## 2023-11-03 ENCOUNTER — Ambulatory Visit: Admitting: Internal Medicine

## 2023-11-19 DIAGNOSIS — C44329 Squamous cell carcinoma of skin of other parts of face: Secondary | ICD-10-CM | POA: Diagnosis not present

## 2023-11-19 DIAGNOSIS — Z85828 Personal history of other malignant neoplasm of skin: Secondary | ICD-10-CM | POA: Insufficient documentation

## 2023-11-20 ENCOUNTER — Other Ambulatory Visit: Payer: Self-pay | Admitting: Internal Medicine

## 2023-11-20 DIAGNOSIS — E785 Hyperlipidemia, unspecified: Secondary | ICD-10-CM

## 2023-11-20 NOTE — Telephone Encounter (Signed)
 Requested medications are due for refill today.  yes  Requested medications are on the active medications list.  yes  Last refill. 11/28/2022 #90 3 rf  Future visit scheduled.   yes  Notes to clinic.  Labs are expired.    Requested Prescriptions  Pending Prescriptions Disp Refills   rosuvastatin (CRESTOR) 5 MG tablet [Pharmacy Med Name: ROSUVASTATIN CALCIUM 5 MG TAB] 90 tablet 3    Sig: TAKE 1 TABLET BY MOUTH DAILY     Cardiovascular:  Antilipid - Statins 2 Failed - 11/20/2023  5:20 PM      Failed - Valid encounter within last 12 months    Recent Outpatient Visits           1 month ago Essential (primary) hypertension   [place] [facility] at [facility], [place], MD       Future Appointments             In 3 months Sheron Dixons, MD [facility] at [facility], [facility]            Failed - Lipid Panel in normal range within the last 12 months    Cholesterol, Total  Date Value Ref Range Status  12/25/2021 157 100 - 199 mg/dL Final   LDL Chol Calc (NIH)  Date Value Ref Range Status  12/25/2021 73 0 - 99 mg/dL Final   HDL  Date Value Ref Range Status  12/25/2021 69 >39 mg/dL Final   Triglycerides  Date Value Ref Range Status  12/25/2021 81 0 - 149 mg/dL Final         Passed - Cr in normal range and within 360 days    Creatinine, Ser  Date Value Ref Range Status  10/21/2023 0.83 0.57 - 1.00 mg/dL Final         Passed - Patient is not pregnant

## 2023-11-21 ENCOUNTER — Other Ambulatory Visit: Payer: Self-pay | Admitting: Internal Medicine

## 2023-11-21 ENCOUNTER — Telehealth: Payer: Self-pay | Admitting: Internal Medicine

## 2023-11-21 DIAGNOSIS — D519 Vitamin B12 deficiency anemia, unspecified: Secondary | ICD-10-CM

## 2023-11-21 MED ORDER — FUSION PLUS PO CAPS: 1.0000 | ORAL_CAPSULE | Freq: Every day | ORAL | 0 refills | Status: AC

## 2023-11-21 NOTE — Telephone Encounter (Signed)
 Please review.  KP

## 2023-11-21 NOTE — Telephone Encounter (Signed)
 Copied from CRM [id number]. Topic: Clinical - Medication Refill >> Nov 21, 2023 11:01 AM Leory Rands wrote: Most Recent Primary Care Visit:  Provider: Sheron Dixons  Department: [facility] [place]  Visit Type: OFFICE VISIT  Date: 10/21/2023  Medication: Fusion Plus Iron Folic Acid Vitamin probioctic supplement capsules Not on medicaiton  Has the patient contacted their pharmacy? Yes (Agent: If no, request that the patient contact the pharmacy for the refill. If patient does not wish to contact the pharmacy document the reason why and proceed with request.) (Agent: If yes, when and what did the pharmacy advise?)  Is this the correct pharmacy for this prescription? Yes If no, delete pharmacy and type the correct one.  This is the patient's preferred pharmacy:  [facility] [id number] - [address] [place] - [address] [place] [address] Phone: [phone] Fax: [phone]      Has the prescription been filled recently? Yes  Is the patient out of the medication? Yes  Has the patient been seen for an appointment in the last year OR does the patient have an upcoming appointment? Yes  Can we respond through MyChart? Yes  Agent: Please be advised that Rx refills may take up to 3 business days. We ask that you follow-up with your pharmacy.

## 2023-12-14 ENCOUNTER — Other Ambulatory Visit: Payer: Self-pay | Admitting: Internal Medicine

## 2023-12-14 DIAGNOSIS — E034 Atrophy of thyroid (acquired): Secondary | ICD-10-CM

## 2023-12-16 ENCOUNTER — Encounter: Payer: Self-pay | Admitting: Emergency Medicine

## 2023-12-16 NOTE — Telephone Encounter (Signed)
 Requested Prescriptions  Pending Prescriptions Disp Refills   levothyroxine  (SYNTHROID ) 75 MCG tablet [Pharmacy Med Name: LEVOTHYROXINE  75 MCG TABLET] 90 tablet 2    Sig: TAKE 1 TABLET BY MOUTH DAILY     Endocrinology:  Hypothyroid Agents Failed - 12/16/2023 11:33 AM      Failed - Valid encounter within last 12 months    Recent Outpatient Visits           1 month ago Essential (primary) hypertension   [place] [facility] at [facility], Chales [place], MD       Future Appointments             In 2 months Gala Jubilee [place], MD [facility] at [facility], [facility]            Passed - TSH in normal range and within 360 days    TSH  Date Value Ref Range Status  10/21/2023 0.750 0.450 - 4.500 uIU/mL Final

## 2023-12-24 ENCOUNTER — Ambulatory Visit (INDEPENDENT_AMBULATORY_CARE_PROVIDER_SITE_OTHER): Payer: Medicare Other | Admitting: Emergency Medicine

## 2023-12-24 VITALS — Ht 63.5 in | Wt 135.0 lb

## 2023-12-24 DIAGNOSIS — Z0000 Encounter for general adult medical examination without abnormal findings: Secondary | ICD-10-CM

## 2023-12-24 NOTE — Patient Instructions (Signed)
 Crystal Zavala , Thank you for taking time out of your busy schedule to complete your Annual Wellness Visit with me. I enjoyed our conversation and look forward to speaking with you again next year. I, as well as your care team,  appreciate your ongoing commitment to your health goals. Please review the following plan we discussed and let me know if I can assist you in the future. Your Game plan/ To Do List    Referrals: Follow up Visits: None   Next Medicare AWV with our clinical staff: 12/29/24 @ 9:30am (phone visit)   Have you seen your provider in the last 6 months (3 months if uncontrolled diabetes)? Yes Next Office Visit with your provider: 02/24/24 @ 1:20pm with Dr. Gala Jubilee  Clinician Recommendations:  Call and schedule a routine eye exam at your earliest convenience. We recommend this every 1-2 years. Aim for 30 minutes of exercise or brisk walking, 6-8 glasses of water, and 5 servings of fruits and vegetables each day.       This is a list of the screening recommended for you and due dates:  Health Maintenance  Topic Date Due   COVID-19 Vaccine (3 - Pfizer risk series) 10/17/2019   Zoster (Shingles) Vaccine (1 of 2) 01/21/2024*   Mammogram  10/20/2024*   Flu Shot  03/05/2024   Medicare Annual Wellness Visit  12/23/2024   DTaP/Tdap/Td vaccine (3 - Td or Tdap) 12/13/2031   Pneumonia Vaccine  Completed   DEXA scan (bone density measurement)  Completed   HPV Vaccine  Aged Out   Meningitis B Vaccine  Aged Out  *Topic was postponed. The date shown is not the original due date.    Advanced directives: (Copy Requested) Please bring a copy of your health care power of attorney and living will to the office to be added to your chart at your convenience. You can mail to [facility] [address] [place], [place] [address] or email to [email or web address]  Advance Care Planning is important because it:  [x]  Makes sure you receive the medical care that is consistent  with your values, goals, and preferences  [x]  It provides guidance to your family and loved ones and reduces their decisional burden about whether or not they are making the right decisions based on your wishes.  Follow the link provided in your after visit summary or read over the paperwork we have mailed to you to help you started getting your Advance Directives in place. If you need assistance in completing these, please reach out to us  so that we can help you!  See attachments for Preventive Care and Fall Prevention Tips.   Fall Prevention in the Home, Adult Falls can cause injuries and affect people of all ages. There are many simple things that you can do to make your home safe and to help prevent falls. If you need it, ask for help making these changes. What actions can I take to prevent falls? General information Use good lighting in all rooms. Make sure to: Replace any light bulbs that burn out. Turn on lights if it is dark and use night-lights. Keep items that you use often in easy-to-reach places. Lower the shelves around your home if needed. Move furniture so that there are clear paths around it. Do not keep throw rugs or other things on the floor that can make you trip. If any of your floors are uneven, fix them. Add color or contrast paint or tape to clearly  mark and help you see: Grab bars or handrails. First and last steps of staircases. Where the edge of each step is. If you use a ladder or stepladder: Make sure that it is fully opened. Do not climb a closed ladder. Make sure the sides of the ladder are locked in place. Have someone hold the ladder while you use it. Know where your pets are as you move through your home. What can I do in the bathroom?     Keep the floor dry. Clean up any water that is on the floor right away. Remove soap buildup in the bathtub or shower. Buildup makes bathtubs and showers slippery. Use non-skid mats or decals on the floor of the  bathtub or shower. Attach bath mats securely with double-sided, non-slip rug tape. If you need to sit down while you are in the shower, use a non-slip stool. Install grab bars by the toilet and in the bathtub and shower. Do not use towel bars as grab bars. What can I do in the bedroom? Make sure that you have a light by your bed that is easy to reach. Do not use any sheets or blankets on your bed that hang to the floor. Have a firm bench or chair with side arms that you can use for support when you get dressed. What can I do in the kitchen? Clean up any spills right away. If you need to reach something above you, use a sturdy step stool that has a grab bar. Keep electrical cables out of the way. Do not use floor polish or wax that makes floors slippery. What can I do with my stairs? Do not leave anything on the stairs. Make sure that you have a light switch at the top and the bottom of the stairs. Have them installed if you do not have them. Make sure that there are handrails on both sides of the stairs. Fix handrails that are broken or loose. Make sure that handrails are as long as the staircases. Install non-slip stair treads on all stairs in your home if they do not have carpet. Avoid having throw rugs at the top or bottom of stairs, or secure the rugs with carpet tape to prevent them from moving. Choose a carpet design that does not hide the edge of steps on the stairs. Make sure that carpet is firmly attached to the stairs. Fix any carpet that is loose or worn. What can I do on the outside of my home? Use bright outdoor lighting. Repair the edges of walkways and driveways and fix any cracks. Clear paths of anything that can make you trip, such as tools or rocks. Add color or contrast paint or tape to clearly mark and help you see high doorway thresholds. Trim any bushes or trees on the main path into your home. Check that handrails are securely fastened and in good repair. Both sides of  all steps should have handrails. Install guardrails along the edges of any raised decks or porches. Have leaves, snow, and ice cleared regularly. Use sand, salt, or ice melt on walkways during winter months if you live where there is ice and snow. In the garage, clean up any spills right away, including grease or oil spills. What other actions can I take? Review your medicines with your health care provider. Some medicines can make you confused or feel dizzy. This can increase your chance of falling. Wear closed-toe shoes that fit well and support your feet. Wear shoes that  have rubber soles and low heels. Use a cane, walker, scooter, or crutches that help you move around if needed. Talk with your provider about other ways that you can decrease your risk of falls. This may include seeing a physical therapist to learn to do exercises to improve movement and strength. Where to find more information Centers for Disease Control and Prevention, STEADI: TonerPromos.no [employer] on Aging: BaseRingTones.pl National Institute on Aging: BaseRingTones.pl Contact a health care provider if: You are afraid of falling at home. You feel weak, drowsy, or dizzy at home. You fall at home. Get help right away if you: Lose consciousness or have trouble moving after a fall. Have a fall that causes a head injury. These symptoms may be an emergency. Get help right away. Call 911. Do not wait to see if the symptoms will go away. Do not drive yourself to the hospital. This information is not intended to replace advice given to you by your health care provider. Make sure you discuss any questions you have with your health care provider. Document Revised: 03/25/2022 Document Reviewed: 03/25/2022 Elsevier Patient Education  2024 [employer].

## 2023-12-24 NOTE — Progress Notes (Signed)
 Subjective:   Crystal Zavala is a 89 y.o. who presents for a Medicare Wellness preventive visit.  As a reminder, Annual Wellness Visits don't include a physical exam, and some assessments may be limited, especially if this visit is performed virtually. We may recommend an in-person follow-up visit with your provider if needed.  Visit Complete: Virtual I connected with  Crystal Zavala on 12/24/23 by a audio enabled telemedicine application and verified that I am speaking with the correct person using two identifiers.  Patient Location: Home  Provider Location: Home Office  I discussed the limitations of evaluation and management by telemedicine. The patient expressed understanding and agreed to proceed.  Vital Signs: Because this visit was a virtual/telehealth visit, some criteria may be missing or patient reported. Any vitals not documented were not able to be obtained and vitals that have been documented are patient reported.  VideoDeclined- This patient declined Librarian, academic. Therefore the visit was completed with audio only.  Persons Participating in Visit: Patient.  AWV Questionnaire: No: Patient Medicare AWV questionnaire was not completed prior to this visit.  Cardiac Risk Factors include: advanced age (>35men, >73 women);dyslipidemia;hypertension     Objective:     Today's Vitals   12/24/23 1441  Weight: 135 lb (61.2 kg)  Height: 5' 3.5" (1.613 m)   Body mass index is 23.54 kg/m.     12/24/2023    2:55 PM 12/18/2022   10:06 AM 12/05/2021   10:09 AM 12/04/2020   10:21 AM 12/01/2019   10:22 AM 11/30/2018    1:39 PM 10/23/2017   10:46 AM  Advanced Directives  Does Patient Have a Medical Advance Directive? Yes No Yes Yes Yes Yes Yes  Type of Estate agent of [place];Living will  Healthcare Power of [place];Living will Healthcare Power of [place];Living will Healthcare Power of [place];Living will Healthcare Power  of [place];Living will Healthcare Power of [place];Living will  Does patient want to make changes to medical advance directive? No - Patient declined        Copy of Healthcare Power of Attorney in Chart? No - copy requested  Yes - validated most recent copy scanned in chart (See row information) No - copy requested No - copy requested No - copy requested No - copy requested  Would patient like information on creating a medical advance directive?  No - Patient declined         Current Medications (verified) Outpatient Encounter Medications as of 12/24/2023  Medication Sig   albuterol  (VENTOLIN  HFA) 108 (90 Base) MCG/ACT inhaler Inhale into the lungs every 6 (six) hours as needed for wheezing or shortness of breath.   cetirizine (ZYRTEC) 10 MG tablet Take 10 mg by mouth daily.   cyanocobalamin (VITAMIN B12) 1000 MCG tablet Take 1 tablet (1,000 mcg total) by mouth daily.   ELIQUIS 2.5 MG TABS tablet Take 2.5 mg by mouth 2 (two) times daily.   fluticasone -salmeterol (ADVAIR) 100-50 MCG/ACT AEPB Inhale 1 puff into the lungs 2 (two) times daily.   furosemide  (LASIX ) 40 MG tablet Take 40 mg by mouth daily as needed.   hydrALAZINE (APRESOLINE) 25 MG tablet Take 25 mg by mouth in the morning and at bedtime.   Iron-FA-B Cmp-C-Biot-Probiotic (FUSION PLUS) CAPS Take 1 capsule by mouth daily.   levothyroxine  (SYNTHROID ) 75 MCG tablet TAKE 1 TABLET BY MOUTH DAILY   metoprolol  succinate (TOPROL -XL) 25 MG 24 hr tablet Take 1 tablet by mouth daily.   pantoprazole  (PROTONIX )  40 MG tablet TAKE 1 TABLET BY MOUTH DAILY   rosuvastatin  (CRESTOR ) 5 MG tablet TAKE 1 TABLET BY MOUTH DAILY   Vitamin D, Cholecalciferol, 50 MCG (2000 UT) CAPS Take 2 capsules by mouth daily.   spironolactone  (ALDACTONE ) 25 MG tablet Take 25 mg by mouth once. (Patient not taking: Reported on 12/24/2023)   No facility-administered encounter medications on file as of 12/24/2023.    Allergies (verified) Amlodipine , Tramadol , and Ace  inhibitors   History: Past Medical History:  Diagnosis Date   Allergy to tramadol  09/03/2018   Asthma    Breast mass, right 01/10/2016   Seen on Mammogram 12/2015 - addi-views appeared benign - repeat 6 months Mammogram 01/2017 with US  - no changed in right density over 2 years Return to annual screening   GERD (gastroesophageal reflux disease)    Hyperlipidemia    Hypertension    Osteoarthritis    in back   Scoliosis    Spondylosis    Thyroid  disease    Past Surgical History:  Procedure Laterality Date   ABDOMINAL HYSTERECTOMY     BLEPHAROPLASTY Bilateral 2013   BREAST BIOPSY Left 2009   benign   COLONOSCOPY  2006   Normal   ESI  01/2017   L5-S1 @ [facility]   ESOPHAGOGASTRODUODENOSCOPY  2015   Dilation of esophageal stricture   TOTAL HIP ARTHROPLASTY Right 2019   Family History  Problem Relation Age of Onset   Tuberculosis Mother    Stroke Father    Bipolar disorder Daughter    Alzheimer's disease Brother    Heart attack Brother    Heart disease Brother    Bipolar disorder Sister    Diabetes Son    Heart attack Brother    Heart disease Brother    Heart attack Brother    Heart disease Brother    Social History   Socioeconomic History   Marital status: Widowed    Spouse name: Not on file   Number of children: 2   Years of education: Not on file   Highest education level: 12th grade  Occupational History   Occupation: Retired  Tobacco Use   Smoking status: Former    Current packs/day: 0.00    Average packs/day: 0.5 packs/day for 39.0 years (19.5 ttl pk-yrs)    Types: Cigarettes    Start date: 24    Quit date: 1992    Years since quitting: 33.4    Passive exposure: Past   Smokeless tobacco: Never   Tobacco comments:    smoking cessation materails not required  Vaping Use   Vaping status: Never Used  Substance and Sexual Activity   Alcohol use: No    Alcohol/week: 0.0 standard drinks of alcohol   Drug use: No   Sexual activity: Never  Other Topics  Concern   Not on file  Social History Narrative   Pt lives alone   Social Drivers of Health   Financial Resource Strain: Low Risk  (12/24/2023)   Overall Financial Resource Strain (CARDIA)    Difficulty of Paying Living Expenses: Not hard at all  Food Insecurity: No Food Insecurity (12/24/2023)   Hunger Vital Sign    Worried About Running Out of Food in the Last Year: Never true    Ran Out of Food in the Last Year: Never true  Transportation Needs: No Transportation Needs (12/24/2023)   PRAPARE - Administrator, Civil Service (Medical): No    Lack of Transportation (Non-Medical): No  Physical Activity:  Insufficiently Active (12/24/2023)   Exercise Vital Sign    Days of Exercise per Week: 3 days    Minutes of Exercise per Session: 10 min  Stress: No Stress Concern Present (12/24/2023)   [employer] of Occupational Health - Occupational Stress Questionnaire    Feeling of Stress : Not at all  Social Connections: Socially Isolated (12/24/2023)   Social Connection and Isolation Panel [NHANES]    Frequency of Communication with Friends and Family: More than three times a week    Frequency of Social Gatherings with Friends and Family: Twice a week    Attends Religious Services: Never    Database administrator or Organizations: No    Attends Banker Meetings: Never    Marital Status: Widowed    Tobacco Counseling Counseling given: No Tobacco comments: smoking cessation materails not required    Clinical Intake:  Pre-visit preparation completed: Yes  Pain : No/denies pain     BMI - recorded: 23.54 Nutritional Status: BMI of 19-24  Normal Nutritional Risks: None Diabetes: No  Lab Results  Component Value Date   HGBA1C 5.2 04/22/2017     How often do you need to have someone help you when you read instructions, pamphlets, or other written materials from your doctor or pharmacy?: 1 - Never  Interpreter Needed?: No  Information entered by ::  Jaunita Messier, CMA   Activities of Daily Living     12/24/2023    2:42 PM  In your present state of health, do you have any difficulty performing the following activities:  Hearing? 0  Vision? 0  Difficulty concentrating or making decisions? 1  Comment making decision a little bit of difficulty  Walking or climbing stairs? 1  Comment uses a rollator  Dressing or bathing? 0  Doing errands, shopping? 1  Comment doesn't drive, friend takes to appointments  Preparing Food and eating ? N  Using the Toilet? N  In the past six months, have you accidently leaked urine? Y  Comment wears pad  Do you have problems with loss of bowel control? N  Managing your Medications? N  Managing your Finances? N  Housekeeping or managing your Housekeeping? N    Patient Care Team: Sheron Dixons, MD as PCP - General (Internal Medicine) Renny Casey, DO as Referring Physician (Interventional Cardiology) Tawanna Faster, MD as Referring Physician (Dermatology) Booker Buys, MD as Referring Physician (Pulmonary Disease)  Indicate any recent Medical Services you may have received from other than [facility] providers in the past year (date may be approximate).     Assessment:    This is a routine wellness examination for [place].  Hearing/Vision screen Hearing Screening - Comments:: Denies hearing loss Vision Screening - Comments:: Will call and schedule eye exam, [facility]   Goals Addressed               This Visit's Progress     Increase physical activity (pt-stated)        By walking more       Depression Screen     12/24/2023    2:52 PM 10/21/2023    3:51 PM 12/24/2022   10:59 AM 12/18/2022   10:04 AM 11/08/2022    2:06 PM 04/22/2022    1:47 PM 03/21/2022    7:59 AM  PHQ 2/9 Scores  PHQ - 2 Score 0 0 0 0 0 1 0  PHQ- 9 Score 1 2 2  0 0 17 3  Fall Risk     12/24/2023    2:56 PM 10/21/2023    3:50 PM 12/24/2022   10:59 AM 12/18/2022   10:06 AM 11/08/2022    2:06 PM   Fall Risk   Falls in the past year? 0 0 0 0 0  Number falls in past yr: 0 0 0 0 0  Injury with Fall? 0 0 0 0 0  Risk for fall due to : Impaired balance/gait;Impaired mobility No Fall Risks No Fall Risks No Fall Risks No Fall Risks  Follow up Falls evaluation completed;Education provided Falls evaluation completed Falls evaluation completed Falls prevention discussed;Falls evaluation completed Falls evaluation completed    MEDICARE RISK AT HOME:  Medicare Risk at Home Any stairs in or around the home?: Yes (2 steps) If so, are there any without handrails?: Yes Home free of loose throw rugs in walkways, pet beds, electrical cords, etc?: Yes Adequate lighting in your home to reduce risk of falls?: Yes Life alert?: Yes Use of a cane, walker or w/c?: Yes (uses a rollator) Grab bars in the bathroom?: Yes Shower chair or bench in shower?: Yes Elevated toilet seat or a handicapped toilet?: Yes  TIMED UP AND GO:  Was the test performed?  No  Cognitive Function: 6CIT completed        12/24/2023    2:58 PM 12/18/2022   10:10 AM 12/01/2019   10:25 AM 11/30/2018    1:42 PM 10/23/2017   10:51 AM  6CIT Screen  What Year? 4 points 0 points 0 points 0 points 0 points  What month? 0 points 0 points 0 points 0 points 0 points  What time? 0 points 0 points 0 points 0 points 0 points  Count back from 20 0 points 0 points 0 points 0 points 0 points  Months in reverse 0 points 0 points 0 points 0 points 0 points  Repeat phrase 0 points 0 points 0 points 0 points 0 points  Total Score 4 points 0 points 0 points 0 points 0 points    Immunizations Immunization History  Administered Date(s) Administered   Fluad Quad(high Dose 65+) 03/31/2019, 05/10/2020, 05/22/2021   Influenza, High Dose Seasonal PF 04/22/2017, 05/06/2023   Influenza,inj,Quad PF,6+ Mos 04/22/2016   Influenza-Unspecified 06/05/2018   PFIZER(Purple Top)SARS-COV-2 Vaccination 08/28/2019, 09/19/2019   Pneumococcal Conjugate-13  09/13/2014   Pneumococcal Polysaccharide-23 10/03/2004   Tdap 08/27/2011, 12/12/2021    Screening Tests Health Maintenance  Topic Date Due   COVID-19 Vaccine (3 - Pfizer risk series) 10/17/2019   Zoster Vaccines- Shingrix (1 of 2) 01/21/2024 (Originally 08/09/1950)   MAMMOGRAM  10/20/2024 (Originally 10/24/2020)   INFLUENZA VACCINE  03/05/2024   Medicare Annual Wellness (AWV)  12/23/2024   DTaP/Tdap/Td (3 - Td or Tdap) 12/13/2031   Pneumonia Vaccine 21+ Years old  Completed   DEXA SCAN  Completed   HPV VACCINES  Aged Out   Meningococcal B Vaccine  Aged Out    Health Maintenance  Health Maintenance Due  Topic Date Due   COVID-19 Vaccine (3 - Pfizer risk series) 10/17/2019   Health Maintenance Items Addressed: See Nurse Notes  Additional Screening:  Vision Screening: Recommended annual ophthalmology exams for early detection of glaucoma and other disorders of the eye.  Dental Screening: Recommended annual dental exams for proper oral hygiene  Community Resource Referral / Chronic Care Management: CRR required this visit?  No   CCM required this visit?  No   Plan:    I have personally reviewed  and noted the following in the patient's chart:   Medical and social history Use of alcohol, tobacco or illicit drugs  Current medications and supplements including opioid prescriptions. Patient is not currently taking opioid prescriptions. Functional ability and status Nutritional status Physical activity Advanced directives List of other physicians Hospitalizations, surgeries, and ER visits in previous 12 months Vitals Screenings to include cognitive, depression, and falls Referrals and appointments  In addition, I have reviewed and discussed with patient certain preventive protocols, quality metrics, and best practice recommendations. A written personalized care plan for preventive services as well as general preventive health recommendations were provided to  patient.   Jaunita Messier, CMA   12/24/2023   After Visit Summary: (Mail) Due to this being a telephonic visit, the after visit summary with patients personalized plan was offered to patient via mail   Notes: Please refer to Routing Comments.

## 2024-01-21 DIAGNOSIS — M170 Bilateral primary osteoarthritis of knee: Secondary | ICD-10-CM | POA: Diagnosis not present

## 2024-02-02 ENCOUNTER — Other Ambulatory Visit: Payer: Self-pay | Admitting: Internal Medicine

## 2024-02-02 DIAGNOSIS — K2100 Gastro-esophageal reflux disease with esophagitis, without bleeding: Secondary | ICD-10-CM

## 2024-02-03 NOTE — Telephone Encounter (Signed)
 Requested by interface surescripts. Future visit in 3 weeks.  Requested Prescriptions  Pending Prescriptions Disp Refills   pantoprazole  (PROTONIX ) 40 MG tablet [Pharmacy Med Name: PANTOPRAZOLE  SOD DR 40 MG TAB] 90 tablet 0    Sig: TAKE 1 TABLET BY MOUTH DAILY     Gastroenterology: Proton Pump Inhibitors Failed - 02/03/2024  3:58 PM      Failed - Valid encounter within last 12 months    Recent Outpatient Visits           3 months ago Essential (primary) hypertension   [facility], Leita DEL, MD       Future Appointments             In 3 weeks Justus Leita DEL, MD [facility] at [facility], [facility]

## 2024-02-24 ENCOUNTER — Encounter: Payer: Self-pay | Admitting: Internal Medicine

## 2024-02-24 ENCOUNTER — Ambulatory Visit (INDEPENDENT_AMBULATORY_CARE_PROVIDER_SITE_OTHER): Admitting: Internal Medicine

## 2024-02-24 VITALS — BP 122/76 | HR 67 | Ht 63.5 in | Wt 139.0 lb

## 2024-02-24 DIAGNOSIS — K2100 Gastro-esophageal reflux disease with esophagitis, without bleeding: Secondary | ICD-10-CM | POA: Diagnosis not present

## 2024-02-24 DIAGNOSIS — Z0000 Encounter for general adult medical examination without abnormal findings: Secondary | ICD-10-CM | POA: Diagnosis not present

## 2024-02-24 DIAGNOSIS — J4520 Mild intermittent asthma, uncomplicated: Secondary | ICD-10-CM | POA: Diagnosis not present

## 2024-02-24 DIAGNOSIS — I480 Paroxysmal atrial fibrillation: Secondary | ICD-10-CM | POA: Diagnosis not present

## 2024-02-24 DIAGNOSIS — I10 Essential (primary) hypertension: Secondary | ICD-10-CM

## 2024-02-24 DIAGNOSIS — E034 Atrophy of thyroid (acquired): Secondary | ICD-10-CM | POA: Diagnosis not present

## 2024-02-24 DIAGNOSIS — G3184 Mild cognitive impairment, so stated: Secondary | ICD-10-CM | POA: Insufficient documentation

## 2024-02-24 DIAGNOSIS — F5101 Primary insomnia: Secondary | ICD-10-CM

## 2024-02-24 DIAGNOSIS — E785 Hyperlipidemia, unspecified: Secondary | ICD-10-CM | POA: Diagnosis not present

## 2024-02-24 DIAGNOSIS — D6869 Other thrombophilia: Secondary | ICD-10-CM

## 2024-02-24 LAB — POCT URINALYSIS DIPSTICK
Bilirubin, UA: NEGATIVE
Blood, UA: NEGATIVE
Glucose, UA: NEGATIVE
Ketones, UA: NEGATIVE
Leukocytes, UA: NEGATIVE
Nitrite, UA: NEGATIVE
Protein, UA: NEGATIVE
Spec Grav, UA: 1.025 (ref 1.010–1.025)
Urobilinogen, UA: 0.2 U/dL
pH, UA: 5.5 (ref 5.0–8.0)

## 2024-02-24 MED ORDER — TRAZODONE HCL 50 MG PO TABS: 50.0000 mg | ORAL_TABLET | Freq: Every day | ORAL | 0 refills | Status: DC

## 2024-02-24 NOTE — Assessment & Plan Note (Signed)
 LDL is  Lab Results  Component Value Date   LDLCALC 73 12/25/2021   Current regimen is Crestor .  No medication side effects noted. Goal LDL is <70.

## 2024-02-24 NOTE — Progress Notes (Signed)
 Date:  02/24/2024   Name:  Crystal Zavala   DOB:  [birth year]   MRN:  [id number]   Chief Complaint: Annual Exam Crystal Zavala is a 89 y.o. female who presents today for her Complete Annual Exam. She feels well. She reports exercising -verylittle. She reports she is sleeping well. Breast complaints - none.  Health Maintenance  Topic Date Due   Zoster (Shingles) Vaccine (1 of 2) Never done   COVID-19 Vaccine (3 - Pfizer risk series) 10/17/2019   Mammogram  10/20/2024*   Flu Shot  03/05/2024   Medicare Annual Wellness Visit  12/23/2024   DTaP/Tdap/Td vaccine (3 - Td or Tdap) 12/13/2031   Pneumococcal Vaccine for age over 34  Completed   DEXA scan (bone density measurement)  Completed   Hepatitis B Vaccine  Aged Out   HPV Vaccine  Aged Out   Meningitis B Vaccine  Aged Out  *Topic was postponed. The date shown is not the original due date.    Hypertension Pertinent negatives include no chest pain, headaches, palpitations or shortness of breath. Identifiable causes of hypertension include a thyroid  problem.  Thyroid  Problem Symptoms include anxiety. Patient reports no constipation, diarrhea, fatigue or palpitations. Her past medical history is significant for hyperlipidemia.  Asthma There is no cough, shortness of breath or wheezing. Associated symptoms include myalgias. Pertinent negatives include no chest pain, headaches or trouble swallowing. Her past medical history is significant for asthma.  Gastroesophageal Reflux She reports no abdominal pain, no chest pain, no coughing or no wheezing. Pertinent negatives include no fatigue.  Hyperlipidemia Associated symptoms include myalgias. Pertinent negatives include no chest pain or shortness of breath.  Anxiety - she has not been feeling like herself over the past few days but has been busy going to the lawyer three days in a row, changing her POA and causing more worry.  She has intermittent insomnia for which she takes trazodone  as  needed.  Today she feels calmer.  No other specific complaints like CP or shortness of breath. MCI - she admits to being somewhat forgetful.  She does not drive and does not cook.  Her home is one story. She manages her own medications but has some help with oversight.  She feels that she is doing well for her age.  She was weaned off of hydrocodone  by Orthopedics recently.  Now looking into Synovisc instead for knee bone on bone OA.   Review of Systems  Constitutional:  Negative for chills, fatigue and unexpected weight change.  HENT:  Negative for trouble swallowing.   Eyes:  Negative for visual disturbance.  Respiratory:  Negative for cough, chest tightness, shortness of breath and wheezing.   Cardiovascular:  Negative for chest pain, palpitations and leg swelling.  Gastrointestinal:  Negative for abdominal pain, constipation and diarrhea.  Genitourinary:  Negative for dysuria and urgency.  Musculoskeletal:  Positive for arthralgias, gait problem and myalgias.  Neurological:  Negative for dizziness, weakness, light-headedness and headaches.  Psychiatric/Behavioral:  Negative for dysphoric mood and sleep disturbance. The patient is nervous/anxious.      Lab Results  Component Value Date   NA 138 10/21/2023   K 4.8 10/21/2023   CO2 23 10/21/2023   GLUCOSE 94 10/21/2023   BUN 16 10/21/2023   CREATININE 0.83 10/21/2023   CALCIUM  9.8 10/21/2023   EGFR 67 10/21/2023   GFRNONAA 55 04/23/2021   Lab Results  Component Value Date   CHOL 157 12/25/2021   HDL 69  12/25/2021   LDLCALC 73 12/25/2021   TRIG 81 12/25/2021   CHOLHDL 2.3 12/25/2021   Lab Results  Component Value Date   TSH 0.750 10/21/2023   Lab Results  Component Value Date   HGBA1C 5.2 04/22/2017   Lab Results  Component Value Date   WBC 6.7 10/21/2023   HGB 10.7 (L) 10/21/2023   HCT 33.8 (L) 10/21/2023   MCV 90 10/21/2023   PLT 330 10/21/2023   Lab Results  Component Value Date   ALT 7 10/21/2023   AST 10  10/21/2023   ALKPHOS 111 10/21/2023   BILITOT 0.3 10/21/2023   No results found for: MARIEN BOLLS, VD25OH   Patient Active Problem List   Diagnosis Date Noted   MCI (mild cognitive impairment) 02/24/2024   Personal history of other malignant neoplasm of skin 11/19/2023   Chronic heart failure with preserved ejection fraction (HFpEF) (HCC) 10/21/2023   Benign neoplasm of peripheral, sympathetic, and parasympathetic nerves and ganglia 11/21/2022   Long term (current) use of anticoagulants 05/15/2022   Presence of right artificial hip joint 05/15/2022   Cerebrovascular accident (CVA) (HCC) 05/10/2020   Decreased functional activity tolerance 02/01/2020   Acquired thrombophilia (HCC) 01/18/2020   Paroxysmal atrial fibrillation (HCC) 02/23/2019   Pain of both hip joints 04/22/2017   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   Spondylosis of lumbar region without myelopathy or radiculopathy 10/21/2016   Hypothyroidism due to acquired atrophy of thyroid  04/22/2016   Primary insomnia 04/22/2016   Aortic atherosclerosis (HCC) 01/18/2015   Arthritis of neck 01/18/2015   Dermatitis, eczematoid 01/18/2015   Essential (primary) hypertension 01/18/2015   Gastroesophageal reflux disease with esophagitis 01/18/2015   H/O adenomatous polyp of colon 01/18/2015   Migraine without aura and responsive to treatment 01/18/2015   Mild intermittent asthma without complication 01/18/2015   Hyperlipidemia, mild 01/18/2015   Lung nodule, multiple 01/18/2015   Acquired cyst of kidney 01/18/2015   Tobacco use disorder, moderate, in sustained remission 01/18/2015   Temporary cerebral vascular dysfunction 01/18/2015   Dysphagia 10/26/2013    Allergies  Allergen Reactions   Amlodipine  Swelling   Tramadol  Nausea Only and Rash   Ace Inhibitors Cough    Past Surgical History:  Procedure Laterality Date   ABDOMINAL HYSTERECTOMY     BLEPHAROPLASTY Bilateral 2013    BREAST BIOPSY Left 2009   benign   COLONOSCOPY  2006   Normal   ESI  01/2017   L5-S1 @ [facility]   ESOPHAGOGASTRODUODENOSCOPY  2015   Dilation of esophageal stricture   TOTAL HIP ARTHROPLASTY Right 2019    Social History   Tobacco Use   Smoking status: Former    Current packs/day: 0.00    Average packs/day: 0.5 packs/day for 39.0 years (19.5 ttl pk-yrs)    Types: Cigarettes    Start date: 31    Quit date: 77    Years since quitting: 33.5    Passive exposure: Past   Smokeless tobacco: Never   Tobacco comments:    smoking cessation materails not required  Vaping Use   Vaping status: Never Used  Substance Use Topics   Alcohol use: No    Alcohol/week: 0.0 standard drinks of alcohol   Drug use: No     Medication list has been reviewed and updated.  Current Meds  Medication Sig   albuterol  (VENTOLIN  HFA) 108 (90 Base) MCG/ACT inhaler Inhale into the lungs every 6 (six) hours as needed for wheezing or shortness of breath.  cetirizine (ZYRTEC) 10 MG tablet Take 10 mg by mouth daily.   cyanocobalamin (VITAMIN B12) 1000 MCG tablet Take 1 tablet (1,000 mcg total) by mouth daily.   ELIQUIS 2.5 MG TABS tablet Take 2.5 mg by mouth 2 (two) times daily.   fluticasone -salmeterol (ADVAIR) 100-50 MCG/ACT AEPB Inhale 1 puff into the lungs 2 (two) times daily.   furosemide  (LASIX ) 40 MG tablet Take 40 mg by mouth daily as needed.   hydrALAZINE (APRESOLINE) 25 MG tablet Take 25 mg by mouth in the morning and at bedtime.   Iron-FA-B Cmp-C-Biot-Probiotic (FUSION PLUS) CAPS Take 1 capsule by mouth daily.   levothyroxine  (SYNTHROID ) 75 MCG tablet TAKE 1 TABLET BY MOUTH DAILY   metoprolol  succinate (TOPROL -XL) 25 MG 24 hr tablet Take 1 tablet by mouth daily.   pantoprazole  (PROTONIX ) 40 MG tablet TAKE 1 TABLET BY MOUTH DAILY   rosuvastatin  (CRESTOR ) 5 MG tablet TAKE 1 TABLET BY MOUTH DAILY   spironolactone  (ALDACTONE ) 25 MG tablet Take 25 mg by mouth once.   traZODone  (DESYREL ) 50 MG tablet  Take 1 tablet (50 mg total) by mouth at bedtime.   Vitamin D, Cholecalciferol, 50 MCG (2000 UT) CAPS Take 2 capsules by mouth daily.       02/24/2024    1:18 PM 10/21/2023    3:51 PM 12/24/2022   10:59 AM 11/08/2022    2:06 PM  GAD 7 : Generalized Anxiety Score  Nervous, Anxious, on Edge 2 0 0 0  Control/stop worrying 2 0 0 0  Worry too much - different things 2 0 1 0  Trouble relaxing 0 0 0 0  Restless 0 0 0 0  Easily annoyed or irritable 0 0 0 0  Afraid - awful might happen 0 0 0 0  Total GAD 7 Score 6 0 1 0  Anxiety Difficulty Somewhat difficult Not difficult at all Not difficult at all Not difficult at all       02/24/2024    1:17 PM 12/24/2023    2:52 PM 10/21/2023    3:51 PM  Depression screen PHQ 2/9  Decreased Interest 0 0 0  Down, Depressed, Hopeless 0 0 0  PHQ - 2 Score 0 0 0  Altered sleeping 0 0 0  Tired, decreased energy 0 1 1  Change in appetite 0 0 0  Feeling bad or failure about yourself  0 0 0  Trouble concentrating 0 0 1  Moving slowly or fidgety/restless 0 0 0  Suicidal thoughts 0 0 0  PHQ-9 Score 0 1 2  Difficult doing work/chores Not difficult at all Not difficult at all Not difficult at all      02/24/2024    1:18 PM 12/24/2023    2:58 PM 12/18/2022   10:10 AM 12/01/2019   10:25 AM 11/30/2018    1:42 PM  6CIT Screen  What Year? 4 points 4 points 0 points 0 points 0 points  What month? 0 points 0 points 0 points 0 points 0 points  What time? 0 points 0 points 0 points 0 points 0 points  Count back from 20 0 points 0 points 0 points 0 points 0 points  Months in reverse 0 points 0 points 0 points 0 points 0 points  Repeat phrase 6 points 0 points 0 points 0 points 0 points  Total Score 10 points 4 points 0 points 0 points 0 points      BP Readings from Last 3 Encounters:  02/24/24 122/76  10/21/23 118/68  12/24/22 118/62    Physical Exam Vitals and nursing note reviewed.  Constitutional:      General: She is not in acute distress.     Appearance: She is well-developed.  HENT:     Head: Normocephalic and atraumatic.     Right Ear: Tympanic membrane and ear canal normal.     Left Ear: Tympanic membrane and ear canal normal.     Nose:     Right Sinus: No maxillary sinus tenderness.     Left Sinus: No maxillary sinus tenderness.  Eyes:     General: No scleral icterus.       Right eye: No discharge.        Left eye: No discharge.     Conjunctiva/sclera: Conjunctivae normal.  Neck:     Thyroid : No thyromegaly.     Vascular: No carotid bruit.  Cardiovascular:     Rate and Rhythm: Normal rate and regular rhythm.     Pulses: Normal pulses.     Heart sounds: Normal heart sounds.  Pulmonary:     Effort: Pulmonary effort is normal. No respiratory distress.     Breath sounds: No wheezing.  Abdominal:     General: Bowel sounds are normal.     Palpations: Abdomen is soft.     Tenderness: There is no abdominal tenderness.  Musculoskeletal:     Cervical back: Normal range of motion. No erythema.     Right lower leg: No edema.     Left lower leg: No edema.  Lymphadenopathy:     Cervical: No cervical adenopathy.  Skin:    General: Skin is warm and dry.     Findings: No rash.  Neurological:     Mental Status: She is alert and oriented to person, place, and time.     Cranial Nerves: No cranial nerve deficit.     Sensory: No sensory deficit.     Deep Tendon Reflexes: Reflexes are normal and symmetric.  Psychiatric:        Attention and Perception: Attention and perception normal.        Mood and Affect: Mood normal.        Speech: Speech normal.        Behavior: Behavior normal.        Thought Content: Thought content normal.        Judgment: Judgment normal.    Lab Results  Component Value Date   COLORU yellow 02/24/2024   CLARITYU clear 02/24/2024   GLUCOSEUR Negative 02/24/2024   BILIRUBINUR neg 02/24/2024   KETONESU neg 02/24/2024   SPECGRAV 1.025 02/24/2024   RBCUR negative 02/24/2024   PHUR 5.5  02/24/2024   PROTEINUR Negative 02/24/2024   UROBILINOGEN 0.2 02/24/2024   LEUKOCYTESUR Negative 02/24/2024     Wt Readings from Last 3 Encounters:  02/24/24 139 lb (63 kg)  12/24/23 135 lb (61.2 kg)  10/21/23 132 lb (59.9 kg)    BP 122/76   Pulse 67   Ht 5' 3.5 (1.613 m)   Wt 139 lb (63 kg)   SpO2 97%   BMI 24.24 kg/m   Assessment and Plan:  Problem List Items Addressed This Visit       Unprioritized   Essential (primary) hypertension (Chronic)   Blood pressure is well controlled.  Current medications metoprolol , lasix , hydralazine and spironolactone  Will continue same regimen along with efforts to limit dietary sodium.       Relevant Orders   CBC with Differential/Platelet   Comprehensive metabolic  panel with GFR   POCT urinalysis dipstick (Completed)   Mild intermittent asthma without complication (Chronic)   Followed by Pulmonary - on Breo or Advair No recent respiratory infections Up to date on immunizations      Hyperlipidemia, mild (Chronic)   LDL is  Lab Results  Component Value Date   LDLCALC 73 12/25/2021   Current regimen is Crestor .  No medication side effects noted. Goal LDL is <70.       Relevant Orders   Lipid panel   Hypothyroidism due to acquired atrophy of thyroid  (Chronic)   Supplemented.      Relevant Orders   TSH+T4F+T3Free   Paroxysmal atrial fibrillation (HCC) (Chronic)   Has been doing well; in SR on exam today She continues on Eliquis without bleeding - adjusted dosing      Relevant Orders   Comprehensive metabolic panel with GFR   Acquired thrombophilia (HCC) (Chronic)   Gastroesophageal reflux disease with esophagitis   Reflux symptoms are controlled on PPI. Patient denies red flag symptoms - no melena, weight loss, dysphagia.       Relevant Orders   CBC with Differential/Platelet   Primary insomnia   Relevant Medications   traZODone  (DESYREL ) 50 MG tablet   MCI (mild cognitive impairment)   Mild decrease in  6-CIT score. Environment is safe; she has [employer] and a cell phone She does not drive or cook at home She is interactive and oriented to person, place, time and situation She has supportive friends and family nearby Will check labs, B12, Thyroid  and Sed Rate      Relevant Orders   Vitamin B12   TSH+T4F+T3Free   Sedimentation rate   Other Visit Diagnoses       Annual physical exam    -  Primary   doing well for age; MCI age appropriate UA negative today      She plans to establish care at [facility] in [place] for PCP care in the near future.  No follow-ups on file.    Leita HILARIO Adie, MD [facility] [place]

## 2024-02-24 NOTE — Assessment & Plan Note (Signed)
 Blood pressure is well controlled.  Current medications metoprolol , lasix , hydralazine and spironolactone  Will continue same regimen along with efforts to limit dietary sodium.

## 2024-02-24 NOTE — Assessment & Plan Note (Signed)
 Followed by Pulmonary - on Breo or Advair No recent respiratory infections Up to date on immunizations

## 2024-02-24 NOTE — Assessment & Plan Note (Signed)
 Reflux symptoms are controlled on PPI. Patient denies red flag symptoms - no melena, weight loss, dysphagia.

## 2024-02-24 NOTE — Assessment & Plan Note (Addendum)
 Mild decrease in 6-CIT score. Environment is safe; she has [employer] and a cell phone She does not drive or cook at home She is interactive and oriented to person, place, time and situation She has supportive friends and family nearby Will check labs, B12, Thyroid  and Sed Rate

## 2024-02-24 NOTE — Assessment & Plan Note (Signed)
 Has been doing well; in SR on exam today She continues on Eliquis without bleeding - adjusted dosing

## 2024-02-24 NOTE — Assessment & Plan Note (Signed)
 Supplemented

## 2024-02-25 ENCOUNTER — Ambulatory Visit: Payer: Self-pay | Admitting: Internal Medicine

## 2024-02-25 LAB — CBC WITH DIFFERENTIAL/PLATELET
Basophils Absolute: 0 x10E3/uL (ref 0.0–0.2)
Basos: 1 %
EOS (ABSOLUTE): 0.1 x10E3/uL (ref 0.0–0.4)
Eos: 1 %
Hematocrit: 36.2 % (ref 34.0–46.6)
Hemoglobin: 11.2 g/dL (ref 11.1–15.9)
Immature Grans (Abs): 0 x10E3/uL (ref 0.0–0.1)
Immature Granulocytes: 0 %
Lymphocytes Absolute: 1.6 x10E3/uL (ref 0.7–3.1)
Lymphs: 20 %
MCH: 28.9 pg (ref 26.6–33.0)
MCHC: 30.9 g/dL — ABNORMAL LOW (ref 31.5–35.7)
MCV: 93 fL (ref 79–97)
Monocytes Absolute: 0.7 x10E3/uL (ref 0.1–0.9)
Monocytes: 10 %
Neutrophils Absolute: 5.3 x10E3/uL (ref 1.4–7.0)
Neutrophils: 68 %
Platelets: 294 x10E3/uL (ref 150–450)
RBC: 3.88 x10E6/uL (ref 3.77–5.28)
RDW: 13.1 % (ref 11.7–15.4)
WBC: 7.7 x10E3/uL (ref 3.4–10.8)

## 2024-02-25 LAB — COMPREHENSIVE METABOLIC PANEL WITH GFR
ALT: 7 IU/L (ref 0–32)
AST: 16 IU/L (ref 0–40)
Albumin: 4.1 g/dL (ref 3.6–4.6)
Alkaline Phosphatase: 111 IU/L (ref 44–121)
BUN/Creatinine Ratio: 15 (ref 12–28)
BUN: 13 mg/dL (ref 10–36)
Bilirubin Total: 0.6 mg/dL (ref 0.0–1.2)
CO2: 21 mmol/L (ref 20–29)
Calcium: 10.1 mg/dL (ref 8.7–10.3)
Chloride: 103 mmol/L (ref 96–106)
Creatinine, Ser: 0.89 mg/dL (ref 0.57–1.00)
Globulin, Total: 2.4 g/dL (ref 1.5–4.5)
Glucose: 88 mg/dL (ref 70–99)
Potassium: 4.2 mmol/L (ref 3.5–5.2)
Sodium: 138 mmol/L (ref 134–144)
Total Protein: 6.5 g/dL (ref 6.0–8.5)
eGFR: 62 mL/min/1.73 (ref >59)

## 2024-02-25 LAB — LIPID PANEL
Chol/HDL Ratio: 2.1 ratio (ref 0.0–4.4)
Cholesterol, Total: 163 mg/dL (ref 100–199)
HDL: 76 mg/dL (ref >39)
LDL Chol Calc (NIH): 72 mg/dL (ref 0–99)
Triglycerides: 78 mg/dL (ref 0–149)
VLDL Cholesterol Cal: 15 mg/dL (ref 5–40)

## 2024-02-25 LAB — TSH+T4F+T3FREE
Free T4: 1.65 ng/dL (ref 0.82–1.77)
T3, Free: 2.6 pg/mL (ref 2.0–4.4)
TSH: 3.5 u[IU]/mL (ref 0.450–4.500)

## 2024-02-25 LAB — VITAMIN B12: Vitamin B-12: 420 pg/mL (ref 232–1245)

## 2024-02-25 LAB — SEDIMENTATION RATE: Sed Rate: 8 mm/h (ref 0–40)

## 2024-03-02 DIAGNOSIS — M170 Bilateral primary osteoarthritis of knee: Secondary | ICD-10-CM | POA: Diagnosis not present

## 2024-03-16 DIAGNOSIS — M170 Bilateral primary osteoarthritis of knee: Secondary | ICD-10-CM | POA: Diagnosis not present

## 2024-03-22 ENCOUNTER — Other Ambulatory Visit: Payer: Self-pay | Admitting: Internal Medicine

## 2024-03-22 NOTE — Telephone Encounter (Unsigned)
 Copied from CRM #[id number]. Topic: Clinical - Medication Refill >> Mar 22, 2024  1:05 PM Edsel HERO wrote: Medication: furosemide  (LASIX ) 40 MG tablet  Has the patient contacted their pharmacy? No  This is the patient's preferred pharmacy:  [facility] [id number] - [address], [place] - [address] [place] [address] Phone: [phone] Fax: [phone]  Is this the correct pharmacy for this prescription? Yes If no, delete pharmacy and type the correct one.   Has the prescription been filled recently? Yes  Is the patient out of the medication? Yes  Has the patient been seen for an appointment in the last year OR does the patient have an upcoming appointment? Yes  Can we respond through MyChart? Yes

## 2024-03-24 MED ORDER — FUROSEMIDE 40 MG PO TABS: 40.0000 mg | ORAL_TABLET | Freq: Every day | ORAL | 0 refills | Status: DC | PRN

## 2024-03-24 NOTE — Telephone Encounter (Signed)
 Requested medication (s) are due for refill today: -  Requested medication (s) are on the active medication list: hx med  Last refill:  10/21/23  Future visit scheduled: no  Notes to clinic:  hx provider   Requested Prescriptions  Pending Prescriptions Disp Refills   furosemide  (LASIX ) 40 MG tablet 30 tablet     Sig: Take 1 tablet (40 mg total) by mouth daily as needed.     Cardiovascular:  Diuretics - Loop Failed - 03/24/2024 12:16 PM      Failed - Mg Level in normal range and within 180 days    Magnesium  Date Value Ref Range Status  02/23/2020 2.1 1.6 - 2.3 mg/dL Final         Passed - K in normal range and within 180 days    Potassium  Date Value Ref Range Status  02/24/2024 4.2 3.5 - 5.2 mmol/L Final         Passed - Ca in normal range and within 180 days    Calcium   Date Value Ref Range Status  02/24/2024 10.1 8.7 - 10.3 mg/dL Final         Passed - Na in normal range and within 180 days    Sodium  Date Value Ref Range Status  02/24/2024 138 134 - 144 mmol/L Final         Passed - Cr in normal range and within 180 days    Creatinine, Ser  Date Value Ref Range Status  02/24/2024 0.89 0.57 - 1.00 mg/dL Final         Passed - Cl in normal range and within 180 days    Chloride  Date Value Ref Range Status  02/24/2024 103 96 - 106 mmol/L Final         Passed - Last BP in normal range    BP Readings from Last 1 Encounters:  02/24/24 122/76         Passed - Valid encounter within last 6 months    Recent Outpatient Visits           4 weeks ago Annual physical exam   [facility] at [facility], Leita DEL, MD   5 months ago Essential (primary) hypertension   [facility] at [facility], Leita DEL, MD

## 2024-03-24 NOTE — Telephone Encounter (Signed)
 Please review and fill if appropriate.  JM

## 2024-04-20 ENCOUNTER — Other Ambulatory Visit: Payer: Self-pay | Admitting: Internal Medicine

## 2024-04-21 NOTE — Telephone Encounter (Signed)
 Requested Prescriptions  Pending Prescriptions Disp Refills   furosemide  (LASIX ) 40 MG tablet [Pharmacy Med Name: FUROSEMIDE  40 MG TABLET] 90 tablet 0    Sig: TAKE 1 TABLET BY MOUTH DAILY AS NEEDED     Cardiovascular:  Diuretics - Loop Failed - 04/21/2024 12:43 PM      Failed - Mg Level in normal range and within 180 days    Magnesium  Date Value Ref Range Status  02/23/2020 2.1 1.6 - 2.3 mg/dL Final         Passed - K in normal range and within 180 days    Potassium  Date Value Ref Range Status  02/24/2024 4.2 3.5 - 5.2 mmol/L Final         Passed - Ca in normal range and within 180 days    Calcium   Date Value Ref Range Status  02/24/2024 10.1 8.7 - 10.3 mg/dL Final         Passed - Na in normal range and within 180 days    Sodium  Date Value Ref Range Status  02/24/2024 138 134 - 144 mmol/L Final         Passed - Cr in normal range and within 180 days    Creatinine, Ser  Date Value Ref Range Status  02/24/2024 0.89 0.57 - 1.00 mg/dL Final         Passed - Cl in normal range and within 180 days    Chloride  Date Value Ref Range Status  02/24/2024 103 96 - 106 mmol/L Final         Passed - Last BP in normal range    BP Readings from Last 1 Encounters:  02/24/24 122/76         Passed - Valid encounter within last 6 months    Recent Outpatient Visits           1 month ago Annual physical exam   [place] [facility] at [facility], Leita DEL, MD   6 months ago Essential (primary) hypertension   [facility] at [facility], Leita DEL, MD

## 2024-05-01 ENCOUNTER — Other Ambulatory Visit: Payer: Self-pay | Admitting: Internal Medicine

## 2024-05-01 DIAGNOSIS — K2100 Gastro-esophageal reflux disease with esophagitis, without bleeding: Secondary | ICD-10-CM

## 2024-05-04 NOTE — Telephone Encounter (Signed)
 Requested Prescriptions  Pending Prescriptions Disp Refills   pantoprazole  (PROTONIX ) 40 MG tablet [Pharmacy Med Name: PANTOPRAZOLE  SOD DR 40 MG TAB] 90 tablet 0    Sig: TAKE 1 TABLET BY MOUTH DAILY     Gastroenterology: Proton Pump Inhibitors Passed - 05/04/2024  9:46 AM      Passed - Valid encounter within last 12 months    Recent Outpatient Visits           2 months ago Annual physical exam   [facility] at [facility], Leita DEL, MD   6 months ago Essential (primary) hypertension   [facility] at [facility], Leita DEL, MD

## 2024-05-16 DIAGNOSIS — I4891 Unspecified atrial fibrillation: Secondary | ICD-10-CM | POA: Diagnosis not present

## 2024-05-16 DIAGNOSIS — I169 Hypertensive crisis, unspecified: Secondary | ICD-10-CM | POA: Diagnosis not present

## 2024-05-16 DIAGNOSIS — Z79899 Other long term (current) drug therapy: Secondary | ICD-10-CM | POA: Diagnosis not present

## 2024-05-16 DIAGNOSIS — R262 Difficulty in walking, not elsewhere classified: Secondary | ICD-10-CM | POA: Diagnosis not present

## 2024-05-16 DIAGNOSIS — J4520 Mild intermittent asthma, uncomplicated: Secondary | ICD-10-CM | POA: Diagnosis not present

## 2024-05-16 DIAGNOSIS — R9431 Abnormal electrocardiogram [ECG] [EKG]: Secondary | ICD-10-CM | POA: Diagnosis not present

## 2024-05-16 DIAGNOSIS — I161 Hypertensive emergency: Secondary | ICD-10-CM | POA: Diagnosis not present

## 2024-05-16 DIAGNOSIS — I5032 Chronic diastolic (congestive) heart failure: Secondary | ICD-10-CM | POA: Diagnosis not present

## 2024-05-16 DIAGNOSIS — I214 Non-ST elevation (NSTEMI) myocardial infarction: Secondary | ICD-10-CM | POA: Diagnosis not present

## 2024-05-16 DIAGNOSIS — Z7902 Long term (current) use of antithrombotics/antiplatelets: Secondary | ICD-10-CM | POA: Diagnosis not present

## 2024-05-16 DIAGNOSIS — Z885 Allergy status to narcotic agent status: Secondary | ICD-10-CM | POA: Diagnosis not present

## 2024-05-16 DIAGNOSIS — J4530 Mild persistent asthma, uncomplicated: Secondary | ICD-10-CM | POA: Diagnosis not present

## 2024-05-16 DIAGNOSIS — I482 Chronic atrial fibrillation, unspecified: Secondary | ICD-10-CM | POA: Diagnosis not present

## 2024-05-16 DIAGNOSIS — I5030 Unspecified diastolic (congestive) heart failure: Secondary | ICD-10-CM | POA: Diagnosis not present

## 2024-05-16 DIAGNOSIS — E039 Hypothyroidism, unspecified: Secondary | ICD-10-CM | POA: Diagnosis not present

## 2024-05-16 DIAGNOSIS — R0789 Other chest pain: Secondary | ICD-10-CM | POA: Diagnosis not present

## 2024-05-16 DIAGNOSIS — Z90710 Acquired absence of both cervix and uterus: Secondary | ICD-10-CM | POA: Diagnosis not present

## 2024-05-16 DIAGNOSIS — Z87891 Personal history of nicotine dependence: Secondary | ICD-10-CM | POA: Diagnosis not present

## 2024-05-16 DIAGNOSIS — R911 Solitary pulmonary nodule: Secondary | ICD-10-CM | POA: Diagnosis not present

## 2024-05-16 DIAGNOSIS — R079 Chest pain, unspecified: Secondary | ICD-10-CM | POA: Diagnosis not present

## 2024-05-16 DIAGNOSIS — K219 Gastro-esophageal reflux disease without esophagitis: Secondary | ICD-10-CM | POA: Diagnosis not present

## 2024-05-16 DIAGNOSIS — I249 Acute ischemic heart disease, unspecified: Secondary | ICD-10-CM | POA: Diagnosis not present

## 2024-05-16 DIAGNOSIS — I110 Hypertensive heart disease with heart failure: Secondary | ICD-10-CM | POA: Diagnosis not present

## 2024-05-16 DIAGNOSIS — I2510 Atherosclerotic heart disease of native coronary artery without angina pectoris: Secondary | ICD-10-CM | POA: Diagnosis not present

## 2024-05-16 DIAGNOSIS — Z6824 Body mass index (BMI) 24.0-24.9, adult: Secondary | ICD-10-CM | POA: Diagnosis not present

## 2024-05-16 DIAGNOSIS — R278 Other lack of coordination: Secondary | ICD-10-CM | POA: Diagnosis not present

## 2024-05-16 DIAGNOSIS — Z743 Need for continuous supervision: Secondary | ICD-10-CM | POA: Diagnosis not present

## 2024-05-16 DIAGNOSIS — Z96649 Presence of unspecified artificial hip joint: Secondary | ICD-10-CM | POA: Diagnosis not present

## 2024-05-16 DIAGNOSIS — I5020 Unspecified systolic (congestive) heart failure: Secondary | ICD-10-CM | POA: Diagnosis not present

## 2024-05-16 DIAGNOSIS — M6281 Muscle weakness (generalized): Secondary | ICD-10-CM | POA: Diagnosis not present

## 2024-05-16 DIAGNOSIS — R41841 Cognitive communication deficit: Secondary | ICD-10-CM | POA: Diagnosis not present

## 2024-05-16 DIAGNOSIS — I25118 Atherosclerotic heart disease of native coronary artery with other forms of angina pectoris: Secondary | ICD-10-CM | POA: Diagnosis not present

## 2024-05-16 DIAGNOSIS — Z7901 Long term (current) use of anticoagulants: Secondary | ICD-10-CM | POA: Diagnosis not present

## 2024-05-16 DIAGNOSIS — E785 Hyperlipidemia, unspecified: Secondary | ICD-10-CM | POA: Diagnosis not present

## 2024-05-16 DIAGNOSIS — I509 Heart failure, unspecified: Secondary | ICD-10-CM | POA: Diagnosis not present

## 2024-05-16 DIAGNOSIS — Z8673 Personal history of transient ischemic attack (TIA), and cerebral infarction without residual deficits: Secondary | ICD-10-CM | POA: Diagnosis not present

## 2024-05-16 DIAGNOSIS — I480 Paroxysmal atrial fibrillation: Secondary | ICD-10-CM | POA: Diagnosis not present

## 2024-05-16 DIAGNOSIS — J45909 Unspecified asthma, uncomplicated: Secondary | ICD-10-CM | POA: Diagnosis not present

## 2024-05-16 DIAGNOSIS — Z888 Allergy status to other drugs, medicaments and biological substances status: Secondary | ICD-10-CM | POA: Diagnosis not present

## 2024-05-16 DIAGNOSIS — E038 Other specified hypothyroidism: Secondary | ICD-10-CM | POA: Diagnosis not present

## 2024-05-16 DIAGNOSIS — I10 Essential (primary) hypertension: Secondary | ICD-10-CM | POA: Diagnosis not present

## 2024-05-16 DIAGNOSIS — E441 Mild protein-calorie malnutrition: Secondary | ICD-10-CM | POA: Diagnosis not present

## 2024-05-16 DIAGNOSIS — I959 Hypotension, unspecified: Secondary | ICD-10-CM | POA: Diagnosis not present

## 2024-05-17 DIAGNOSIS — I214 Non-ST elevation (NSTEMI) myocardial infarction: Secondary | ICD-10-CM | POA: Insufficient documentation

## 2024-05-17 DIAGNOSIS — R079 Chest pain, unspecified: Secondary | ICD-10-CM | POA: Diagnosis not present

## 2024-05-21 DIAGNOSIS — I110 Hypertensive heart disease with heart failure: Secondary | ICD-10-CM | POA: Diagnosis not present

## 2024-05-21 DIAGNOSIS — J45909 Unspecified asthma, uncomplicated: Secondary | ICD-10-CM | POA: Diagnosis not present

## 2024-05-21 DIAGNOSIS — E8809 Other disorders of plasma-protein metabolism, not elsewhere classified: Secondary | ICD-10-CM | POA: Diagnosis not present

## 2024-05-21 DIAGNOSIS — I10 Essential (primary) hypertension: Secondary | ICD-10-CM | POA: Diagnosis not present

## 2024-05-21 DIAGNOSIS — I161 Hypertensive emergency: Secondary | ICD-10-CM | POA: Diagnosis not present

## 2024-05-21 DIAGNOSIS — E039 Hypothyroidism, unspecified: Secondary | ICD-10-CM | POA: Diagnosis not present

## 2024-05-21 DIAGNOSIS — Z743 Need for continuous supervision: Secondary | ICD-10-CM | POA: Diagnosis not present

## 2024-05-21 DIAGNOSIS — I2510 Atherosclerotic heart disease of native coronary artery without angina pectoris: Secondary | ICD-10-CM | POA: Diagnosis not present

## 2024-05-21 DIAGNOSIS — Z8673 Personal history of transient ischemic attack (TIA), and cerebral infarction without residual deficits: Secondary | ICD-10-CM | POA: Diagnosis not present

## 2024-05-21 DIAGNOSIS — Z90710 Acquired absence of both cervix and uterus: Secondary | ICD-10-CM | POA: Diagnosis not present

## 2024-05-21 DIAGNOSIS — Z885 Allergy status to narcotic agent status: Secondary | ICD-10-CM | POA: Diagnosis not present

## 2024-05-21 DIAGNOSIS — J4530 Mild persistent asthma, uncomplicated: Secondary | ICD-10-CM | POA: Diagnosis not present

## 2024-05-21 DIAGNOSIS — R911 Solitary pulmonary nodule: Secondary | ICD-10-CM | POA: Diagnosis not present

## 2024-05-21 DIAGNOSIS — M6281 Muscle weakness (generalized): Secondary | ICD-10-CM | POA: Diagnosis not present

## 2024-05-21 DIAGNOSIS — Z79890 Hormone replacement therapy (postmenopausal): Secondary | ICD-10-CM | POA: Diagnosis not present

## 2024-05-21 DIAGNOSIS — Z888 Allergy status to other drugs, medicaments and biological substances status: Secondary | ICD-10-CM | POA: Diagnosis not present

## 2024-05-21 DIAGNOSIS — I201 Angina pectoris with documented spasm: Secondary | ICD-10-CM | POA: Diagnosis not present

## 2024-05-21 DIAGNOSIS — R41841 Cognitive communication deficit: Secondary | ICD-10-CM | POA: Diagnosis not present

## 2024-05-21 DIAGNOSIS — Z7982 Long term (current) use of aspirin: Secondary | ICD-10-CM | POA: Diagnosis not present

## 2024-05-21 DIAGNOSIS — Z7902 Long term (current) use of antithrombotics/antiplatelets: Secondary | ICD-10-CM | POA: Diagnosis not present

## 2024-05-21 DIAGNOSIS — E785 Hyperlipidemia, unspecified: Secondary | ICD-10-CM | POA: Diagnosis not present

## 2024-05-21 DIAGNOSIS — I482 Chronic atrial fibrillation, unspecified: Secondary | ICD-10-CM | POA: Diagnosis not present

## 2024-05-21 DIAGNOSIS — R278 Other lack of coordination: Secondary | ICD-10-CM | POA: Diagnosis not present

## 2024-05-21 DIAGNOSIS — Z7901 Long term (current) use of anticoagulants: Secondary | ICD-10-CM | POA: Diagnosis not present

## 2024-05-21 DIAGNOSIS — R079 Chest pain, unspecified: Secondary | ICD-10-CM | POA: Diagnosis not present

## 2024-05-21 DIAGNOSIS — I4891 Unspecified atrial fibrillation: Secondary | ICD-10-CM | POA: Diagnosis not present

## 2024-05-21 DIAGNOSIS — K219 Gastro-esophageal reflux disease without esophagitis: Secondary | ICD-10-CM | POA: Diagnosis not present

## 2024-05-21 DIAGNOSIS — I169 Hypertensive crisis, unspecified: Secondary | ICD-10-CM | POA: Diagnosis not present

## 2024-05-21 DIAGNOSIS — I4719 Other supraventricular tachycardia: Secondary | ICD-10-CM | POA: Diagnosis not present

## 2024-05-21 DIAGNOSIS — Z87891 Personal history of nicotine dependence: Secondary | ICD-10-CM | POA: Diagnosis not present

## 2024-05-21 DIAGNOSIS — M545 Low back pain, unspecified: Secondary | ICD-10-CM | POA: Diagnosis not present

## 2024-05-21 DIAGNOSIS — I493 Ventricular premature depolarization: Secondary | ICD-10-CM | POA: Diagnosis not present

## 2024-05-21 DIAGNOSIS — I5030 Unspecified diastolic (congestive) heart failure: Secondary | ICD-10-CM | POA: Diagnosis not present

## 2024-05-21 DIAGNOSIS — E441 Mild protein-calorie malnutrition: Secondary | ICD-10-CM | POA: Diagnosis not present

## 2024-05-21 DIAGNOSIS — E079 Disorder of thyroid, unspecified: Secondary | ICD-10-CM | POA: Diagnosis not present

## 2024-05-21 DIAGNOSIS — Z7951 Long term (current) use of inhaled steroids: Secondary | ICD-10-CM | POA: Diagnosis not present

## 2024-05-21 DIAGNOSIS — R0789 Other chest pain: Secondary | ICD-10-CM | POA: Diagnosis not present

## 2024-05-21 DIAGNOSIS — Z955 Presence of coronary angioplasty implant and graft: Secondary | ICD-10-CM | POA: Diagnosis not present

## 2024-05-21 DIAGNOSIS — I480 Paroxysmal atrial fibrillation: Secondary | ICD-10-CM | POA: Diagnosis not present

## 2024-05-21 DIAGNOSIS — G8929 Other chronic pain: Secondary | ICD-10-CM | POA: Diagnosis not present

## 2024-05-21 DIAGNOSIS — I252 Old myocardial infarction: Secondary | ICD-10-CM | POA: Diagnosis not present

## 2024-05-21 DIAGNOSIS — I5032 Chronic diastolic (congestive) heart failure: Secondary | ICD-10-CM | POA: Diagnosis not present

## 2024-05-21 DIAGNOSIS — Z1152 Encounter for screening for COVID-19: Secondary | ICD-10-CM | POA: Diagnosis not present

## 2024-05-21 DIAGNOSIS — R262 Difficulty in walking, not elsewhere classified: Secondary | ICD-10-CM | POA: Diagnosis not present

## 2024-05-21 DIAGNOSIS — I214 Non-ST elevation (NSTEMI) myocardial infarction: Secondary | ICD-10-CM | POA: Diagnosis not present

## 2024-05-21 DIAGNOSIS — J4520 Mild intermittent asthma, uncomplicated: Secondary | ICD-10-CM | POA: Diagnosis not present

## 2024-05-21 DIAGNOSIS — I951 Orthostatic hypotension: Secondary | ICD-10-CM | POA: Diagnosis not present

## 2024-05-21 DIAGNOSIS — Z79899 Other long term (current) drug therapy: Secondary | ICD-10-CM | POA: Diagnosis not present

## 2024-05-23 ENCOUNTER — Other Ambulatory Visit: Payer: Self-pay | Admitting: Internal Medicine

## 2024-05-23 DIAGNOSIS — F5101 Primary insomnia: Secondary | ICD-10-CM

## 2024-05-24 DIAGNOSIS — Z8673 Personal history of transient ischemic attack (TIA), and cerebral infarction without residual deficits: Secondary | ICD-10-CM | POA: Diagnosis not present

## 2024-05-24 DIAGNOSIS — M6281 Muscle weakness (generalized): Secondary | ICD-10-CM | POA: Diagnosis not present

## 2024-05-24 DIAGNOSIS — Z955 Presence of coronary angioplasty implant and graft: Secondary | ICD-10-CM | POA: Diagnosis not present

## 2024-05-24 DIAGNOSIS — I482 Chronic atrial fibrillation, unspecified: Secondary | ICD-10-CM | POA: Diagnosis not present

## 2024-05-24 DIAGNOSIS — K219 Gastro-esophageal reflux disease without esophagitis: Secondary | ICD-10-CM | POA: Diagnosis not present

## 2024-05-24 DIAGNOSIS — I480 Paroxysmal atrial fibrillation: Secondary | ICD-10-CM | POA: Diagnosis not present

## 2024-05-24 DIAGNOSIS — Z7902 Long term (current) use of antithrombotics/antiplatelets: Secondary | ICD-10-CM | POA: Diagnosis not present

## 2024-05-24 DIAGNOSIS — I951 Orthostatic hypotension: Secondary | ICD-10-CM | POA: Diagnosis not present

## 2024-05-24 DIAGNOSIS — E079 Disorder of thyroid, unspecified: Secondary | ICD-10-CM | POA: Diagnosis not present

## 2024-05-24 DIAGNOSIS — I493 Ventricular premature depolarization: Secondary | ICD-10-CM | POA: Diagnosis not present

## 2024-05-24 DIAGNOSIS — J4520 Mild intermittent asthma, uncomplicated: Secondary | ICD-10-CM | POA: Diagnosis not present

## 2024-05-24 DIAGNOSIS — I214 Non-ST elevation (NSTEMI) myocardial infarction: Secondary | ICD-10-CM | POA: Diagnosis not present

## 2024-05-24 DIAGNOSIS — Z7951 Long term (current) use of inhaled steroids: Secondary | ICD-10-CM | POA: Diagnosis not present

## 2024-05-24 DIAGNOSIS — J45909 Unspecified asthma, uncomplicated: Secondary | ICD-10-CM | POA: Diagnosis not present

## 2024-05-24 DIAGNOSIS — Z79899 Other long term (current) drug therapy: Secondary | ICD-10-CM | POA: Diagnosis not present

## 2024-05-24 DIAGNOSIS — I2510 Atherosclerotic heart disease of native coronary artery without angina pectoris: Secondary | ICD-10-CM | POA: Diagnosis not present

## 2024-05-24 DIAGNOSIS — I201 Angina pectoris with documented spasm: Secondary | ICD-10-CM | POA: Diagnosis not present

## 2024-05-24 DIAGNOSIS — R911 Solitary pulmonary nodule: Secondary | ICD-10-CM | POA: Diagnosis not present

## 2024-05-24 DIAGNOSIS — I169 Hypertensive crisis, unspecified: Secondary | ICD-10-CM | POA: Diagnosis not present

## 2024-05-24 DIAGNOSIS — Z885 Allergy status to narcotic agent status: Secondary | ICD-10-CM | POA: Diagnosis not present

## 2024-05-24 DIAGNOSIS — J4530 Mild persistent asthma, uncomplicated: Secondary | ICD-10-CM | POA: Diagnosis not present

## 2024-05-24 DIAGNOSIS — R278 Other lack of coordination: Secondary | ICD-10-CM | POA: Diagnosis not present

## 2024-05-24 DIAGNOSIS — Z888 Allergy status to other drugs, medicaments and biological substances status: Secondary | ICD-10-CM | POA: Diagnosis not present

## 2024-05-24 DIAGNOSIS — Z7901 Long term (current) use of anticoagulants: Secondary | ICD-10-CM | POA: Diagnosis not present

## 2024-05-24 DIAGNOSIS — E8809 Other disorders of plasma-protein metabolism, not elsewhere classified: Secondary | ICD-10-CM | POA: Diagnosis not present

## 2024-05-24 DIAGNOSIS — I110 Hypertensive heart disease with heart failure: Secondary | ICD-10-CM | POA: Diagnosis not present

## 2024-05-24 DIAGNOSIS — Z87891 Personal history of nicotine dependence: Secondary | ICD-10-CM | POA: Diagnosis not present

## 2024-05-24 DIAGNOSIS — I252 Old myocardial infarction: Secondary | ICD-10-CM | POA: Diagnosis not present

## 2024-05-24 DIAGNOSIS — Z743 Need for continuous supervision: Secondary | ICD-10-CM | POA: Diagnosis not present

## 2024-05-24 DIAGNOSIS — Z90710 Acquired absence of both cervix and uterus: Secondary | ICD-10-CM | POA: Diagnosis not present

## 2024-05-24 DIAGNOSIS — M545 Low back pain, unspecified: Secondary | ICD-10-CM | POA: Diagnosis not present

## 2024-05-24 DIAGNOSIS — Z79890 Hormone replacement therapy (postmenopausal): Secondary | ICD-10-CM | POA: Diagnosis not present

## 2024-05-24 DIAGNOSIS — G8929 Other chronic pain: Secondary | ICD-10-CM | POA: Diagnosis not present

## 2024-05-24 DIAGNOSIS — E039 Hypothyroidism, unspecified: Secondary | ICD-10-CM | POA: Diagnosis not present

## 2024-05-24 DIAGNOSIS — I4719 Other supraventricular tachycardia: Secondary | ICD-10-CM | POA: Diagnosis not present

## 2024-05-24 DIAGNOSIS — I5032 Chronic diastolic (congestive) heart failure: Secondary | ICD-10-CM | POA: Diagnosis not present

## 2024-05-24 DIAGNOSIS — R41841 Cognitive communication deficit: Secondary | ICD-10-CM | POA: Diagnosis not present

## 2024-05-24 DIAGNOSIS — I5030 Unspecified diastolic (congestive) heart failure: Secondary | ICD-10-CM | POA: Diagnosis not present

## 2024-05-24 DIAGNOSIS — E785 Hyperlipidemia, unspecified: Secondary | ICD-10-CM | POA: Diagnosis not present

## 2024-05-24 DIAGNOSIS — I10 Essential (primary) hypertension: Secondary | ICD-10-CM | POA: Diagnosis not present

## 2024-05-24 DIAGNOSIS — Z7982 Long term (current) use of aspirin: Secondary | ICD-10-CM | POA: Diagnosis not present

## 2024-05-24 DIAGNOSIS — R079 Chest pain, unspecified: Secondary | ICD-10-CM | POA: Diagnosis not present

## 2024-05-24 DIAGNOSIS — Z1152 Encounter for screening for COVID-19: Secondary | ICD-10-CM | POA: Diagnosis not present

## 2024-05-24 DIAGNOSIS — E441 Mild protein-calorie malnutrition: Secondary | ICD-10-CM | POA: Diagnosis not present

## 2024-05-24 DIAGNOSIS — I4891 Unspecified atrial fibrillation: Secondary | ICD-10-CM | POA: Diagnosis not present

## 2024-05-24 DIAGNOSIS — I161 Hypertensive emergency: Secondary | ICD-10-CM | POA: Diagnosis not present

## 2024-05-24 DIAGNOSIS — R262 Difficulty in walking, not elsewhere classified: Secondary | ICD-10-CM | POA: Diagnosis not present

## 2024-05-25 NOTE — Telephone Encounter (Signed)
 Requested Prescriptions  Pending Prescriptions Disp Refills   traZODone  (DESYREL ) 50 MG tablet [Pharmacy Med Name: traZODone  50 MG TABLET] 90 tablet 0    Sig: TAKE 1 TABLET BY MOUTH AT BEDTIME     Psychiatry: Antidepressants - Serotonin Modulator Passed - 05/25/2024 12:03 PM      Passed - Valid encounter within last 6 months    Recent Outpatient Visits           3 months ago Annual physical exam   [place] [facility] at [facility], Leita DEL, MD   7 months ago Essential (primary) hypertension   [facility] at [facility], Leita DEL, MD

## 2024-05-31 DIAGNOSIS — E039 Hypothyroidism, unspecified: Secondary | ICD-10-CM | POA: Diagnosis not present

## 2024-06-02 ENCOUNTER — Other Ambulatory Visit: Payer: Self-pay | Admitting: Internal Medicine

## 2024-06-02 NOTE — Progress Notes (Unsigned)
 Date:  06/02/2024   Name:  Crystal Zavala   DOB:  1932-07-14   MRN:  [id number]   Chief Complaint: No chief complaint on file.  HPI  Review of Systems   Lab Results  Component Value Date   NA 138 02/24/2024   K 4.2 02/24/2024   CO2 21 02/24/2024   GLUCOSE 88 02/24/2024   BUN 13 02/24/2024   CREATININE 0.89 02/24/2024   CALCIUM  10.1 02/24/2024   EGFR 62 02/24/2024   GFRNONAA 55 04/23/2021   Lab Results  Component Value Date   CHOL 163 02/24/2024   HDL 76 02/24/2024   LDLCALC 72 02/24/2024   TRIG 78 02/24/2024   CHOLHDL 2.1 02/24/2024   Lab Results  Component Value Date   TSH 3.500 02/24/2024   Lab Results  Component Value Date   HGBA1C 5.2 04/22/2017   Lab Results  Component Value Date   WBC 7.7 02/24/2024   HGB 11.2 02/24/2024   HCT 36.2 02/24/2024   MCV 93 02/24/2024   PLT 294 02/24/2024   Lab Results  Component Value Date   ALT 7 02/24/2024   AST 16 02/24/2024   ALKPHOS 111 02/24/2024   BILITOT 0.6 02/24/2024   No results found for: 25OHVITD2, 25OHVITD3, VD25OH   Patient Active Problem List   Diagnosis Date Noted   MCI (mild cognitive impairment) 02/24/2024   Personal history of other malignant neoplasm of skin 11/19/2023   Chronic heart failure with preserved ejection fraction (HFpEF) (HCC) 10/21/2023   Benign neoplasm of peripheral, sympathetic, and parasympathetic nerves and ganglia 11/21/2022   Long term (current) use of anticoagulants 05/15/2022   Presence of right artificial hip joint 05/15/2022   Cerebrovascular accident (CVA) (HCC) 05/10/2020   Decreased functional activity tolerance 02/01/2020   Acquired thrombophilia 01/18/2020   Paroxysmal atrial fibrillation (HCC) 02/23/2019   Pain of both hip joints 04/22/2017   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   Spondylosis of lumbar region without myelopathy or radiculopathy 10/21/2016   Hypothyroidism due to acquired atrophy of thyroid  04/22/2016    Primary insomnia 04/22/2016   Aortic atherosclerosis 01/18/2015   Arthritis of neck 01/18/2015   Dermatitis, eczematoid 01/18/2015   Essential (primary) hypertension 01/18/2015   Gastroesophageal reflux disease with esophagitis 01/18/2015   H/O adenomatous polyp of colon 01/18/2015   Migraine without aura and responsive to treatment 01/18/2015   Mild intermittent asthma without complication 01/18/2015   Hyperlipidemia, mild 01/18/2015   Lung nodule, multiple 01/18/2015   Acquired cyst of kidney 01/18/2015   Tobacco use disorder, moderate, in sustained remission 01/18/2015   Temporary cerebral vascular dysfunction 01/18/2015   Dysphagia 10/26/2013    Allergies  Allergen Reactions   Amlodipine  Swelling   Tramadol  Nausea Only and Rash   Ace Inhibitors Cough    Past Surgical History:  Procedure Laterality Date   ABDOMINAL HYSTERECTOMY     BLEPHAROPLASTY Bilateral 2013   BREAST BIOPSY Left 2009   benign   COLONOSCOPY  2006   Normal   ESI  01/2017   L5-S1 @ [facility]   ESOPHAGOGASTRODUODENOSCOPY  2015   Dilation of esophageal stricture   TOTAL HIP ARTHROPLASTY Right 2019    Social History   Tobacco Use   Smoking status: Former    Current packs/day: 0.00    Average packs/day: 0.5 packs/day for 39.0 years (19.5 ttl pk-yrs)    Types: Cigarettes    Start date: 41    Quit date: 1992  Years since quitting: 33.8    Passive exposure: Past   Smokeless tobacco: Never   Tobacco comments:    smoking cessation materails not required  Vaping Use   Vaping status: Never Used  Substance Use Topics   Alcohol use: No    Alcohol/week: 0.0 standard drinks of alcohol   Drug use: No     Medication list has been reviewed and updated.  No outpatient medications have been marked as taking for the 06/02/24 encounter (Orders Only) with Justus Leita DEL, MD.       02/24/2024    1:18 PM 10/21/2023    3:51 PM 12/24/2022   10:59 AM 11/08/2022    2:06 PM  GAD 7 : Generalized Anxiety  Score  Nervous, Anxious, on Edge 2 0 0 0  Control/stop worrying 2 0 0 0  Worry too much - different things 2 0 1 0  Trouble relaxing 0 0 0 0  Restless 0 0 0 0  Easily annoyed or irritable 0 0 0 0  Afraid - awful might happen 0 0 0 0  Total GAD 7 Score 6 0 1 0  Anxiety Difficulty Somewhat difficult Not difficult at all Not difficult at all Not difficult at all       02/24/2024    1:17 PM 12/24/2023    2:52 PM 10/21/2023    3:51 PM  Depression screen PHQ 2/9  Decreased Interest 0 0 0  Down, Depressed, Hopeless 0 0 0  PHQ - 2 Score 0 0 0  Altered sleeping 0 0 0  Tired, decreased energy 0 1 1  Change in appetite 0 0 0  Feeling bad or failure about yourself  0 0 0  Trouble concentrating 0 0 1  Moving slowly or fidgety/restless 0 0 0  Suicidal thoughts 0 0 0  PHQ-9 Score 0 1 2  Difficult doing work/chores Not difficult at all Not difficult at all Not difficult at all    BP Readings from Last 3 Encounters:  02/24/24 122/76  10/21/23 118/68  12/24/22 118/62    Physical Exam  Wt Readings from Last 3 Encounters:  02/24/24 139 lb (63 kg)  12/24/23 135 lb (61.2 kg)  10/21/23 132 lb (59.9 kg)    There were no vitals taken for this visit.  Assessment and Plan:  Problem List Items Addressed This Visit   None   No follow-ups on file.    Leita HILARIO Justus, MD [facility] [place]

## 2024-06-03 DIAGNOSIS — I214 Non-ST elevation (NSTEMI) myocardial infarction: Secondary | ICD-10-CM | POA: Diagnosis not present

## 2024-06-03 DIAGNOSIS — I5030 Unspecified diastolic (congestive) heart failure: Secondary | ICD-10-CM | POA: Diagnosis not present

## 2024-06-03 DIAGNOSIS — I10 Essential (primary) hypertension: Secondary | ICD-10-CM | POA: Diagnosis not present

## 2024-06-03 DIAGNOSIS — I482 Chronic atrial fibrillation, unspecified: Secondary | ICD-10-CM | POA: Diagnosis not present

## 2024-06-07 DIAGNOSIS — I471 Supraventricular tachycardia, unspecified: Secondary | ICD-10-CM | POA: Diagnosis not present

## 2024-06-07 DIAGNOSIS — R072 Precordial pain: Secondary | ICD-10-CM | POA: Diagnosis not present

## 2024-06-07 DIAGNOSIS — Z87891 Personal history of nicotine dependence: Secondary | ICD-10-CM | POA: Diagnosis not present

## 2024-06-07 DIAGNOSIS — I214 Non-ST elevation (NSTEMI) myocardial infarction: Secondary | ICD-10-CM | POA: Diagnosis not present

## 2024-06-07 DIAGNOSIS — Z955 Presence of coronary angioplasty implant and graft: Secondary | ICD-10-CM | POA: Diagnosis not present

## 2024-06-07 DIAGNOSIS — R079 Chest pain, unspecified: Secondary | ICD-10-CM | POA: Diagnosis not present

## 2024-06-07 DIAGNOSIS — R278 Other lack of coordination: Secondary | ICD-10-CM | POA: Diagnosis not present

## 2024-06-07 DIAGNOSIS — Z9889 Other specified postprocedural states: Secondary | ICD-10-CM | POA: Diagnosis not present

## 2024-06-07 DIAGNOSIS — Z743 Need for continuous supervision: Secondary | ICD-10-CM | POA: Diagnosis not present

## 2024-06-07 DIAGNOSIS — I213 ST elevation (STEMI) myocardial infarction of unspecified site: Secondary | ICD-10-CM | POA: Diagnosis not present

## 2024-06-07 DIAGNOSIS — Z7901 Long term (current) use of anticoagulants: Secondary | ICD-10-CM | POA: Diagnosis not present

## 2024-06-07 DIAGNOSIS — Z7902 Long term (current) use of antithrombotics/antiplatelets: Secondary | ICD-10-CM | POA: Diagnosis not present

## 2024-06-21 DIAGNOSIS — E559 Vitamin D deficiency, unspecified: Secondary | ICD-10-CM | POA: Diagnosis not present

## 2024-06-21 DIAGNOSIS — Z79899 Other long term (current) drug therapy: Secondary | ICD-10-CM | POA: Diagnosis not present

## 2024-07-30 ENCOUNTER — Other Ambulatory Visit: Payer: Self-pay | Admitting: Internal Medicine

## 2024-07-30 DIAGNOSIS — K2100 Gastro-esophageal reflux disease with esophagitis, without bleeding: Secondary | ICD-10-CM

## 2024-08-02 NOTE — Telephone Encounter (Signed)
 Requested Prescriptions  Pending Prescriptions Disp Refills   pantoprazole  (PROTONIX ) 40 MG tablet [Pharmacy Med Name: PANTOPRAZOLE  SOD DR 40 MG TAB] 90 tablet 0    Sig: TAKE 1 TABLET BY MOUTH DAILY     Gastroenterology: Proton Pump Inhibitors Passed - 08/02/2024 12:03 PM      Passed - Valid encounter within last 12 months    Recent Outpatient Visits           5 months ago Annual physical exam   [facility] at [facility], Leita DEL, MD   9 months ago Essential (primary) hypertension   [facility] at [facility], Leita DEL, MD

## 2024-12-29 ENCOUNTER — Ambulatory Visit
